# Patient Record
Sex: Female | Born: 1940 | Race: White | Hispanic: No | Marital: Married | Smoking: Never smoker
Health system: Southern US, Community
[De-identification: ages and names within clinical notes are randomized; demographics above are authoritative.]

## PROBLEM LIST (undated history)

## (undated) DIAGNOSIS — K589 Irritable bowel syndrome without diarrhea: Secondary | ICD-10-CM

## (undated) DIAGNOSIS — Z9889 Other specified postprocedural states: Secondary | ICD-10-CM

## (undated) DIAGNOSIS — Z872 Personal history of diseases of the skin and subcutaneous tissue: Secondary | ICD-10-CM

## (undated) DIAGNOSIS — Z860100 Personal history of colon polyps, unspecified: Secondary | ICD-10-CM

## (undated) DIAGNOSIS — K219 Gastro-esophageal reflux disease without esophagitis: Secondary | ICD-10-CM

## (undated) DIAGNOSIS — G629 Polyneuropathy, unspecified: Secondary | ICD-10-CM

## (undated) DIAGNOSIS — Z9221 Personal history of antineoplastic chemotherapy: Secondary | ICD-10-CM

## (undated) DIAGNOSIS — Z9289 Personal history of other medical treatment: Secondary | ICD-10-CM

## (undated) DIAGNOSIS — I890 Lymphedema, not elsewhere classified: Secondary | ICD-10-CM

## (undated) DIAGNOSIS — J45909 Unspecified asthma, uncomplicated: Secondary | ICD-10-CM

## (undated) DIAGNOSIS — E039 Hypothyroidism, unspecified: Secondary | ICD-10-CM

## (undated) DIAGNOSIS — C562 Malignant neoplasm of left ovary: Secondary | ICD-10-CM

## (undated) DIAGNOSIS — D649 Anemia, unspecified: Secondary | ICD-10-CM

## (undated) DIAGNOSIS — L0390 Cellulitis, unspecified: Secondary | ICD-10-CM

## (undated) DIAGNOSIS — K2270 Barrett's esophagus without dysplasia: Secondary | ICD-10-CM

## (undated) DIAGNOSIS — E119 Type 2 diabetes mellitus without complications: Secondary | ICD-10-CM

## (undated) DIAGNOSIS — I10 Essential (primary) hypertension: Secondary | ICD-10-CM

## (undated) DIAGNOSIS — Z8619 Personal history of other infectious and parasitic diseases: Secondary | ICD-10-CM

## (undated) HISTORY — DX: Other specified postprocedural states: Z98.890

## (undated) HISTORY — DX: Personal history of other infectious and parasitic diseases: Z86.19

## (undated) HISTORY — DX: Gastro-esophageal reflux disease without esophagitis: K21.9

## (undated) HISTORY — DX: Cellulitis, unspecified: L03.90

## (undated) HISTORY — DX: Irritable bowel syndrome, unspecified: K58.9

## (undated) HISTORY — DX: Hypothyroidism, unspecified: E03.9

## (undated) HISTORY — DX: Personal history of colon polyps, unspecified: Z86.0100

## (undated) HISTORY — DX: Personal history of other medical treatment: Z92.89

## (undated) HISTORY — DX: Personal history of colonic polyps: Z86.010

## (undated) HISTORY — DX: Unspecified asthma, uncomplicated: J45.909

## (undated) HISTORY — DX: Lymphedema, not elsewhere classified: I89.0

## (undated) HISTORY — DX: Personal history of diseases of the skin and subcutaneous tissue: Z87.2

## (undated) HISTORY — DX: Essential (primary) hypertension: I10

## (undated) HISTORY — DX: Personal history of antineoplastic chemotherapy: Z92.21

## (undated) HISTORY — PX: TUBAL LIGATION: SHX77

## (undated) HISTORY — DX: Barrett's esophagus without dysplasia: K22.70

## (undated) HISTORY — DX: Malignant neoplasm of left ovary: C56.2

---
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
## 1961-04-21 HISTORY — PX: HEMORRHOID SURGERY: SHX153

## 1984-04-21 DIAGNOSIS — Z8619 Personal history of other infectious and parasitic diseases: Secondary | ICD-10-CM

## 1984-04-21 HISTORY — DX: Personal history of other infectious and parasitic diseases: Z86.19

## 1992-04-21 HISTORY — PX: BREAST BIOPSY: SHX20

## 1994-04-21 HISTORY — PX: CHOLECYSTECTOMY: SHX55

## 2006-08-26 ENCOUNTER — Ambulatory Visit: Payer: Self-pay | Admitting: Internal Medicine

## 2006-08-31 ENCOUNTER — Ambulatory Visit: Payer: Self-pay | Admitting: Obstetrics and Gynecology

## 2006-09-09 ENCOUNTER — Ambulatory Visit: Payer: Self-pay | Admitting: Gastroenterology

## 2006-09-15 ENCOUNTER — Ambulatory Visit: Payer: Self-pay | Admitting: Internal Medicine

## 2007-07-21 DIAGNOSIS — Z86018 Personal history of other benign neoplasm: Secondary | ICD-10-CM

## 2007-07-21 HISTORY — DX: Personal history of other benign neoplasm: Z86.018

## 2008-07-05 ENCOUNTER — Ambulatory Visit: Payer: Self-pay | Admitting: Unknown Physician Specialty

## 2008-07-19 ENCOUNTER — Ambulatory Visit: Payer: Self-pay | Admitting: Unknown Physician Specialty

## 2009-11-07 ENCOUNTER — Ambulatory Visit: Payer: Self-pay | Admitting: Specialist

## 2010-02-27 ENCOUNTER — Ambulatory Visit: Payer: Self-pay | Admitting: Unknown Physician Specialty

## 2011-04-29 ENCOUNTER — Ambulatory Visit: Payer: Self-pay | Admitting: Unknown Physician Specialty

## 2012-12-13 ENCOUNTER — Ambulatory Visit: Payer: Self-pay | Admitting: Gynecologic Oncology

## 2012-12-20 ENCOUNTER — Ambulatory Visit: Payer: Self-pay | Admitting: Oncology

## 2012-12-20 HISTORY — PX: OTHER SURGICAL HISTORY: SHX169

## 2012-12-21 ENCOUNTER — Inpatient Hospital Stay: Payer: Self-pay | Admitting: Obstetrics and Gynecology

## 2012-12-21 ENCOUNTER — Other Ambulatory Visit: Payer: Self-pay | Admitting: Obstetrics and Gynecology

## 2012-12-21 LAB — CBC WITH DIFFERENTIAL/PLATELET
Basophil #: 0 10*3/uL (ref 0.0–0.1)
Basophil #: 0 10*3/uL (ref 0.0–0.1)
Basophil %: 0.3 %
Eosinophil %: 1.5 %
Eosinophil %: 0.1 %
HCT: 42.6 % (ref 35.0–47.0)
Lymphocyte %: 22.3 %
MCHC: 35 g/dL (ref 32.0–36.0)
MCHC: 34.7 g/dL (ref 32.0–36.0)
MCV: 86 fL (ref 80–100)
Monocyte #: 0.8 x10 3/mm (ref 0.2–0.9)
Monocyte #: 0.8 x10 3/mm (ref 0.2–0.9)
Monocyte %: 3.9 %
Neutrophil #: 9.6 10*3/uL — ABNORMAL HIGH (ref 1.4–6.5)
Neutrophil #: 19.2 10*3/uL — ABNORMAL HIGH (ref 1.4–6.5)
Platelet: 217 10*3/uL (ref 150–440)
Platelet: 211 10*3/uL (ref 150–440)
RBC: 4.45 10*6/uL (ref 3.80–5.20)
WBC: 13.7 10*3/uL — ABNORMAL HIGH (ref 3.6–11.0)

## 2012-12-21 LAB — COMPREHENSIVE METABOLIC PANEL
Alkaline Phosphatase: 74 U/L (ref 50–136)
Anion Gap: 7 (ref 7–16)
Anion Gap: 3 — ABNORMAL LOW (ref 7–16)
BUN: 14 mg/dL (ref 7–18)
BUN: 11 mg/dL (ref 7–18)
Bilirubin,Total: 0.6 mg/dL (ref 0.2–1.0)
Bilirubin,Total: 0.5 mg/dL (ref 0.2–1.0)
Calcium, Total: 9.4 mg/dL (ref 8.5–10.1)
Calcium, Total: 8 mg/dL — ABNORMAL LOW (ref 8.5–10.1)
Co2: 27 mmol/L (ref 21–32)
Creatinine: 0.91 mg/dL (ref 0.60–1.30)
EGFR (African American): >60
EGFR (Non-African Amer.): >60
EGFR (Non-African Amer.): >60
Osmolality: 278 (ref 275–301)
Osmolality: 276 (ref 275–301)
Potassium: 3 mmol/L — ABNORMAL LOW (ref 3.5–5.1)
Potassium: 3.3 mmol/L — ABNORMAL LOW (ref 3.5–5.1)
SGOT(AST): 22 U/L (ref 15–37)
SGOT(AST): 30 U/L (ref 15–37)
SGPT (ALT): 33 U/L (ref 12–78)
SGPT (ALT): 34 U/L (ref 12–78)
Sodium: 139 mmol/L (ref 136–145)
Sodium: 136 mmol/L (ref 136–145)
Total Protein: 7.1 g/dL (ref 6.4–8.2)
Total Protein: 6.1 g/dL — ABNORMAL LOW (ref 6.4–8.2)

## 2012-12-22 LAB — HEMATOCRIT: HCT: 43.5 % (ref 35.0–47.0)

## 2013-01-10 LAB — PATHOLOGY REPORT

## 2013-01-11 ENCOUNTER — Ambulatory Visit: Payer: Self-pay | Admitting: Oncology

## 2013-01-11 LAB — COMPREHENSIVE METABOLIC PANEL
Albumin: 3.7 g/dL (ref 3.4–5.0)
Anion Gap: 9 (ref 7–16)
BUN: 18 mg/dL (ref 7–18)
Bilirubin,Total: 0.4 mg/dL (ref 0.2–1.0)
Calcium, Total: 9.9 mg/dL (ref 8.5–10.1)
Co2: 31 mmol/L (ref 21–32)
Creatinine: 0.95 mg/dL (ref 0.60–1.30)
EGFR (African American): >60
Osmolality: 284 (ref 275–301)
Potassium: 3 mmol/L — ABNORMAL LOW (ref 3.5–5.1)
SGOT(AST): 18 U/L (ref 15–37)
SGPT (ALT): 30 U/L (ref 12–78)
Total Protein: 7.5 g/dL (ref 6.4–8.2)

## 2013-01-11 LAB — CBC CANCER CENTER
Basophil %: 0.7 %
HCT: 42.5 % (ref 35.0–47.0)
Lymphocyte %: 22.9 %
MCHC: 34.2 g/dL (ref 32.0–36.0)
Monocyte #: 0.5 x10 3/mm (ref 0.2–0.9)
Monocyte %: 5.6 %
Platelet: 302 x10 3/mm (ref 150–440)
RBC: 4.98 10*6/uL (ref 3.80–5.20)
WBC: 8.9 x10 3/mm (ref 3.6–11.0)

## 2013-01-13 LAB — CANCER ANTIGEN 19-9: CA 19-9: 62 U/mL — ABNORMAL HIGH (ref 0–35)

## 2013-01-19 ENCOUNTER — Ambulatory Visit: Payer: Self-pay | Admitting: Oncology

## 2013-01-27 ENCOUNTER — Ambulatory Visit: Payer: Self-pay | Admitting: Surgery

## 2013-02-07 LAB — CBC CANCER CENTER
Basophil #: 0 x10 3/mm (ref 0.0–0.1)
Eosinophil #: 0.2 x10 3/mm (ref 0.0–0.7)
Eosinophil %: 6.1 %
HCT: 37.2 % (ref 35.0–47.0)
HGB: 12.9 g/dL (ref 12.0–16.0)
Lymphocyte %: 22.8 %
MCH: 29.5 pg (ref 26.0–34.0)
MCHC: 34.7 g/dL (ref 32.0–36.0)
Monocyte #: 0.2 x10 3/mm (ref 0.2–0.9)
Monocyte %: 4.3 %
Neutrophil #: 2.6 x10 3/mm (ref 1.4–6.5)
RBC: 4.39 10*6/uL (ref 3.80–5.20)
RDW: 13.9 % (ref 11.5–14.5)
WBC: 3.9 x10 3/mm (ref 3.6–11.0)

## 2013-02-14 LAB — CBC CANCER CENTER
Basophil #: 0 x10 3/mm (ref 0.0–0.1)
Basophil %: 0.3 %
Eosinophil #: 0.2 x10 3/mm (ref 0.0–0.7)
Lymphocyte %: 46.1 %
MCH: 29.3 pg (ref 26.0–34.0)
MCV: 86 fL (ref 80–100)
Monocyte #: 0.7 x10 3/mm (ref 0.2–0.9)
Platelet: 224 x10 3/mm (ref 150–440)
RDW: 14.8 % — ABNORMAL HIGH (ref 11.5–14.5)

## 2013-02-19 ENCOUNTER — Ambulatory Visit: Payer: Self-pay | Admitting: Oncology

## 2013-02-21 LAB — COMPREHENSIVE METABOLIC PANEL
Albumin: 3.6 g/dL (ref 3.4–5.0)
Anion Gap: 12 (ref 7–16)
Bilirubin,Total: 0.5 mg/dL (ref 0.2–1.0)
Co2: 27 mmol/L (ref 21–32)
EGFR (African American): >60
Glucose: 141 mg/dL — ABNORMAL HIGH (ref 65–99)
Potassium: 2.9 mmol/L — ABNORMAL LOW (ref 3.5–5.1)
SGOT(AST): 20 U/L (ref 15–37)
SGPT (ALT): 32 U/L (ref 12–78)
Total Protein: 7.3 g/dL (ref 6.4–8.2)

## 2013-02-21 LAB — CBC CANCER CENTER
Basophil %: 0.7 %
HCT: 38.4 % (ref 35.0–47.0)
Lymphocyte %: 22.5 %
MCHC: 34.4 g/dL (ref 32.0–36.0)
MCV: 86 fL (ref 80–100)
Monocyte #: 0.5 x10 3/mm (ref 0.2–0.9)
Monocyte %: 5.8 %
Neutrophil %: 70 %
RBC: 4.49 10*6/uL (ref 3.80–5.20)
WBC: 9.3 x10 3/mm (ref 3.6–11.0)

## 2013-02-22 LAB — CA 125: CA 125: 31.1 U/mL (ref 0.0–34.0)

## 2013-02-28 LAB — CBC CANCER CENTER
Basophil #: 0.1 x10 3/mm (ref 0.0–0.1)
Eosinophil #: 0.2 x10 3/mm (ref 0.0–0.7)
HCT: 39.1 % (ref 35.0–47.0)
Lymphocyte %: 23.5 %
MCHC: 33.9 g/dL (ref 32.0–36.0)
Monocyte %: 6.4 %
Neutrophil %: 67.1 %
RBC: 4.54 10*6/uL (ref 3.80–5.20)

## 2013-03-07 LAB — CBC CANCER CENTER
Basophil #: 0 x10 3/mm (ref 0.0–0.1)
Basophil %: 0.4 %
Eosinophil %: 1.1 %
HCT: 34.5 % — ABNORMAL LOW (ref 35.0–47.0)
HGB: 11.9 g/dL — ABNORMAL LOW (ref 12.0–16.0)
MCH: 29.3 pg (ref 26.0–34.0)
MCHC: 34.4 g/dL (ref 32.0–36.0)
MCV: 85 fL (ref 80–100)
Monocyte %: 5.6 %
Neutrophil #: 2.7 x10 3/mm (ref 1.4–6.5)
Neutrophil %: 54.7 %
Platelet: 92 x10 3/mm — ABNORMAL LOW (ref 150–440)
RBC: 4.05 10*6/uL (ref 3.80–5.20)
RDW: 14.7 % — ABNORMAL HIGH (ref 11.5–14.5)
WBC: 5 x10 3/mm (ref 3.6–11.0)

## 2013-03-14 LAB — CBC CANCER CENTER
Basophil #: 0 x10 3/mm (ref 0.0–0.1)
Eosinophil #: 0 x10 3/mm (ref 0.0–0.7)
HCT: 35.1 % (ref 35.0–47.0)
Lymphocyte #: 0.7 x10 3/mm — ABNORMAL LOW (ref 1.0–3.6)
Lymphocyte %: 11.7 %
MCH: 29.9 pg (ref 26.0–34.0)
MCHC: 33.9 g/dL (ref 32.0–36.0)
MCV: 88 fL (ref 80–100)
Monocyte #: 0.1 x10 3/mm — ABNORMAL LOW (ref 0.2–0.9)
Monocyte %: 1.1 %
Neutrophil %: 86.9 %
Platelet: 201 x10 3/mm (ref 150–440)
RDW: 16.4 % — ABNORMAL HIGH (ref 11.5–14.5)

## 2013-03-15 LAB — CA 125: CA 125: 27.4 U/mL (ref 0.0–34.0)

## 2013-03-21 ENCOUNTER — Ambulatory Visit: Payer: Self-pay | Admitting: Oncology

## 2013-03-21 LAB — CBC CANCER CENTER
Basophil %: 0.1 %
Eosinophil #: 0 x10 3/mm (ref 0.0–0.7)
Eosinophil %: 0.1 %
HCT: 34.6 % — ABNORMAL LOW (ref 35.0–47.0)
HGB: 12 g/dL (ref 12.0–16.0)
Lymphocyte #: 1.1 x10 3/mm (ref 1.0–3.6)
Lymphocyte %: 22.2 %
MCH: 30.4 pg (ref 26.0–34.0)
MCHC: 34.7 g/dL (ref 32.0–36.0)
Monocyte #: 0.1 x10 3/mm — ABNORMAL LOW (ref 0.2–0.9)
Monocyte %: 1.1 %
RBC: 3.95 10*6/uL (ref 3.80–5.20)
RDW: 16.9 % — ABNORMAL HIGH (ref 11.5–14.5)

## 2013-03-21 LAB — COMPREHENSIVE METABOLIC PANEL
Albumin: 3.6 g/dL (ref 3.4–5.0)
Anion Gap: 8 (ref 7–16)
BUN: 18 mg/dL (ref 7–18)
Bilirubin,Total: 0.5 mg/dL (ref 0.2–1.0)
Co2: 32 mmol/L (ref 21–32)
EGFR (Non-African Amer.): 53 — ABNORMAL LOW
Glucose: 169 mg/dL — ABNORMAL HIGH (ref 65–99)
Osmolality: 285 (ref 275–301)
Potassium: 2.9 mmol/L — ABNORMAL LOW (ref 3.5–5.1)
SGOT(AST): 32 U/L (ref 15–37)
SGPT (ALT): 66 U/L (ref 12–78)
Total Protein: 7.3 g/dL (ref 6.4–8.2)

## 2013-03-21 LAB — URINALYSIS, COMPLETE
Ph: 6 (ref 4.5–8.0)
Protein: NEGATIVE
RBC,UR: 1 /HPF (ref 0–5)
Specific Gravity: 1.012 (ref 1.003–1.030)
Squamous Epithelial: 1

## 2013-04-04 LAB — CBC CANCER CENTER
HCT: 29.5 % — ABNORMAL LOW (ref 35.0–47.0)
HGB: 9.9 g/dL — ABNORMAL LOW (ref 12.0–16.0)
Lymphocyte %: 51.7 %
MCH: 30.4 pg (ref 26.0–34.0)
MCV: 90 fL (ref 80–100)
Monocyte #: 0.1 x10 3/mm — ABNORMAL LOW (ref 0.2–0.9)
Monocyte %: 3.1 %
Platelet: 564 x10 3/mm — ABNORMAL HIGH (ref 150–440)
RBC: 3.27 10*6/uL — ABNORMAL LOW (ref 3.80–5.20)
RDW: 17.9 % — ABNORMAL HIGH (ref 11.5–14.5)

## 2013-04-04 LAB — COMPREHENSIVE METABOLIC PANEL
Albumin: 3.6 g/dL (ref 3.4–5.0)
Chloride: 99 mmol/L (ref 98–107)
Co2: 25 mmol/L (ref 21–32)
Creatinine: 1.16 mg/dL (ref 0.60–1.30)
EGFR (African American): 55 — ABNORMAL LOW
Glucose: 284 mg/dL — ABNORMAL HIGH (ref 65–99)
Osmolality: 288 (ref 275–301)
Potassium: 2.9 mmol/L — ABNORMAL LOW (ref 3.5–5.1)
SGPT (ALT): 98 U/L — ABNORMAL HIGH (ref 12–78)
Sodium: 139 mmol/L (ref 136–145)
Total Protein: 7.2 g/dL (ref 6.4–8.2)

## 2013-04-11 LAB — CBC CANCER CENTER
Basophil #: 0.1 x10 3/mm (ref 0.0–0.1)
HGB: 11.5 g/dL — ABNORMAL LOW (ref 12.0–16.0)
Lymphocyte #: 1.7 x10 3/mm (ref 1.0–3.6)
MCHC: 33.5 g/dL (ref 32.0–36.0)
MCV: 91 fL (ref 80–100)
Monocyte %: 9.7 %
Neutrophil #: 4.3 x10 3/mm (ref 1.4–6.5)
Platelet: 400 x10 3/mm (ref 150–440)
RBC: 3.78 10*6/uL — ABNORMAL LOW (ref 3.80–5.20)
RDW: 20.8 % — ABNORMAL HIGH (ref 11.5–14.5)

## 2013-04-12 LAB — CA 125: CA 125: 26.2 U/mL (ref 0.0–34.0)

## 2013-04-18 LAB — CBC CANCER CENTER
Basophil #: 0 x10 3/mm (ref 0.0–0.1)
Basophil %: 0.2 %
Eosinophil #: 0 x10 3/mm (ref 0.0–0.7)
HCT: 32.1 % — ABNORMAL LOW (ref 35.0–47.0)
Lymphocyte %: 16.1 %
MCHC: 33.9 g/dL (ref 32.0–36.0)
MCV: 90 fL (ref 80–100)
Neutrophil #: 5.8 x10 3/mm (ref 1.4–6.5)
Neutrophil %: 80.9 %
RBC: 3.56 10*6/uL — ABNORMAL LOW (ref 3.80–5.20)
WBC: 7.1 x10 3/mm (ref 3.6–11.0)

## 2013-04-21 ENCOUNTER — Ambulatory Visit: Payer: Self-pay | Admitting: Oncology

## 2013-05-02 LAB — COMPREHENSIVE METABOLIC PANEL
Albumin: 3.9 g/dL (ref 3.4–5.0)
Alkaline Phosphatase: 91 U/L
Anion Gap: 14 (ref 7–16)
BUN: 11 mg/dL (ref 7–18)
Bilirubin,Total: 0.4 mg/dL (ref 0.2–1.0)
CALCIUM: 8.9 mg/dL (ref 8.5–10.1)
CHLORIDE: 99 mmol/L (ref 98–107)
Co2: 27 mmol/L (ref 21–32)
Creatinine: 1.03 mg/dL (ref 0.60–1.30)
EGFR (African American): >60
EGFR (Non-African Amer.): 55 — ABNORMAL LOW
Glucose: 198 mg/dL — ABNORMAL HIGH (ref 65–99)
OSMOLALITY: 284 (ref 275–301)
Potassium: 3.6 mmol/L (ref 3.5–5.1)
SGOT(AST): 44 U/L — ABNORMAL HIGH (ref 15–37)
SGPT (ALT): 70 U/L (ref 12–78)
Sodium: 140 mmol/L (ref 136–145)
TOTAL PROTEIN: 7.4 g/dL (ref 6.4–8.2)

## 2013-05-02 LAB — CBC CANCER CENTER
BASOS PCT: 1.4 %
Basophil #: 0.1 x10 3/mm (ref 0.0–0.1)
EOS PCT: 0.5 %
Eosinophil #: 0 x10 3/mm (ref 0.0–0.7)
HCT: 29.9 % — ABNORMAL LOW (ref 35.0–47.0)
HGB: 9.9 g/dL — AB (ref 12.0–16.0)
LYMPHS ABS: 1.3 x10 3/mm (ref 1.0–3.6)
Lymphocyte %: 18.2 %
MCH: 31.3 pg (ref 26.0–34.0)
MCHC: 33.1 g/dL (ref 32.0–36.0)
MCV: 94 fL (ref 80–100)
MONO ABS: 0.2 x10 3/mm (ref 0.2–0.9)
Monocyte %: 2.6 %
Neutrophil #: 5.7 x10 3/mm (ref 1.4–6.5)
Neutrophil %: 77.3 %
PLATELETS: 281 x10 3/mm (ref 150–440)
RBC: 3.17 10*6/uL — ABNORMAL LOW (ref 3.80–5.20)
RDW: 22.7 % — ABNORMAL HIGH (ref 11.5–14.5)
WBC: 7.4 x10 3/mm (ref 3.6–11.0)

## 2013-05-03 LAB — CA 125: CA 125: 21.8 U/mL (ref 0.0–34.0)

## 2013-05-09 LAB — CBC CANCER CENTER
Basophil #: 0 x10 3/mm (ref 0.0–0.1)
Basophil %: 0.3 %
EOS PCT: 0.3 %
Eosinophil #: 0 x10 3/mm (ref 0.0–0.7)
HCT: 29.1 % — AB (ref 35.0–47.0)
HGB: 9.7 g/dL — ABNORMAL LOW (ref 12.0–16.0)
LYMPHS ABS: 1.4 x10 3/mm (ref 1.0–3.6)
Lymphocyte %: 27.4 %
MCH: 30.9 pg (ref 26.0–34.0)
MCHC: 33.5 g/dL (ref 32.0–36.0)
MCV: 92 fL (ref 80–100)
MONOS PCT: 4 %
Monocyte #: 0.2 x10 3/mm (ref 0.2–0.9)
NEUTROS ABS: 3.5 x10 3/mm (ref 1.4–6.5)
Neutrophil %: 68 %
Platelet: 215 x10 3/mm (ref 150–440)
RBC: 3.15 10*6/uL — ABNORMAL LOW (ref 3.80–5.20)
RDW: 20.6 % — ABNORMAL HIGH (ref 11.5–14.5)
WBC: 5.1 x10 3/mm (ref 3.6–11.0)

## 2013-05-16 LAB — CBC CANCER CENTER
BASOS PCT: 0.2 %
Basophil #: 0 x10 3/mm (ref 0.0–0.1)
EOS PCT: 0.3 %
Eosinophil #: 0 x10 3/mm (ref 0.0–0.7)
HCT: 25.5 % — ABNORMAL LOW (ref 35.0–47.0)
HGB: 8.5 g/dL — AB (ref 12.0–16.0)
LYMPHS ABS: 0.9 x10 3/mm — AB (ref 1.0–3.6)
LYMPHS PCT: 21.8 %
MCH: 31 pg (ref 26.0–34.0)
MCHC: 33.3 g/dL (ref 32.0–36.0)
MCV: 93 fL (ref 80–100)
MONO ABS: 0.3 x10 3/mm (ref 0.2–0.9)
MONOS PCT: 6.5 %
NEUTROS ABS: 3.1 x10 3/mm (ref 1.4–6.5)
Neutrophil %: 71.2 %
Platelet: 24 x10 3/mm — CL (ref 150–440)
RBC: 2.74 10*6/uL — ABNORMAL LOW (ref 3.80–5.20)
RDW: 19.1 % — ABNORMAL HIGH (ref 11.5–14.5)
WBC: 4.4 x10 3/mm (ref 3.6–11.0)

## 2013-05-22 ENCOUNTER — Ambulatory Visit: Payer: Self-pay | Admitting: Oncology

## 2013-05-23 LAB — CBC CANCER CENTER
Basophil #: 0 x10 3/mm (ref 0.0–0.1)
Basophil %: 0.5 %
EOS ABS: 0 x10 3/mm (ref 0.0–0.7)
Eosinophil %: 0.2 %
HCT: 28.4 % — ABNORMAL LOW (ref 35.0–47.0)
HGB: 9.3 g/dL — ABNORMAL LOW (ref 12.0–16.0)
LYMPHS PCT: 16.3 %
Lymphocyte #: 0.9 x10 3/mm — ABNORMAL LOW (ref 1.0–3.6)
MCH: 31.9 pg (ref 26.0–34.0)
MCHC: 32.7 g/dL (ref 32.0–36.0)
MCV: 97 fL (ref 80–100)
MONO ABS: 0.1 x10 3/mm — AB (ref 0.2–0.9)
MONOS PCT: 1.9 %
Neutrophil #: 4.6 x10 3/mm (ref 1.4–6.5)
Neutrophil %: 81.1 %
PLATELETS: 136 x10 3/mm — AB (ref 150–440)
RBC: 2.91 10*6/uL — ABNORMAL LOW (ref 3.80–5.20)
RDW: 23.6 % — ABNORMAL HIGH (ref 11.5–14.5)
WBC: 5.7 x10 3/mm (ref 3.6–11.0)

## 2013-05-23 LAB — COMPREHENSIVE METABOLIC PANEL
ALBUMIN: 3.9 g/dL (ref 3.4–5.0)
ALT: 47 U/L (ref 12–78)
Alkaline Phosphatase: 73 U/L
Anion Gap: 17 — ABNORMAL HIGH (ref 7–16)
BILIRUBIN TOTAL: 0.4 mg/dL (ref 0.2–1.0)
BUN: 16 mg/dL (ref 7–18)
CALCIUM: 8.8 mg/dL (ref 8.5–10.1)
Chloride: 96 mmol/L — ABNORMAL LOW (ref 98–107)
Co2: 25 mmol/L (ref 21–32)
Creatinine: 1.06 mg/dL (ref 0.60–1.30)
EGFR (African American): >60
EGFR (Non-African Amer.): 53 — ABNORMAL LOW
Glucose: 256 mg/dL — ABNORMAL HIGH (ref 65–99)
Osmolality: 286 (ref 275–301)
Potassium: 3.2 mmol/L — ABNORMAL LOW (ref 3.5–5.1)
SGOT(AST): 29 U/L (ref 15–37)
Sodium: 138 mmol/L (ref 136–145)
Total Protein: 7.3 g/dL (ref 6.4–8.2)

## 2013-05-23 LAB — IRON AND TIBC
IRON BIND. CAP.(TOTAL): 392 ug/dL (ref 250–450)
IRON SATURATION: 17 %
Iron: 67 ug/dL (ref 50–170)
UNBOUND IRON-BIND. CAP.: 325 ug/dL

## 2013-05-24 LAB — CA 125: CA 125: 18.3 U/mL (ref 0.0–34.0)

## 2013-05-30 LAB — CBC CANCER CENTER
Basophil #: 0 x10 3/mm (ref 0.0–0.1)
Basophil %: 0.3 %
EOS PCT: 0.2 %
Eosinophil #: 0 x10 3/mm (ref 0.0–0.7)
HCT: 26.2 % — AB (ref 35.0–47.0)
HGB: 8.7 g/dL — ABNORMAL LOW (ref 12.0–16.0)
LYMPHS PCT: 29.3 %
Lymphocyte #: 0.7 x10 3/mm — ABNORMAL LOW (ref 1.0–3.6)
MCH: 31.3 pg (ref 26.0–34.0)
MCHC: 33.2 g/dL (ref 32.0–36.0)
MCV: 94 fL (ref 80–100)
MONO ABS: 0.1 x10 3/mm — AB (ref 0.2–0.9)
Monocyte %: 2.6 %
Neutrophil #: 1.5 x10 3/mm (ref 1.4–6.5)
Neutrophil %: 67.6 %
Platelet: 112 x10 3/mm — ABNORMAL LOW (ref 150–440)
RBC: 2.78 10*6/uL — ABNORMAL LOW (ref 3.80–5.20)
RDW: 20.4 % — ABNORMAL HIGH (ref 11.5–14.5)
WBC: 2.2 x10 3/mm — ABNORMAL LOW (ref 3.6–11.0)

## 2013-06-13 LAB — COMPREHENSIVE METABOLIC PANEL
ALK PHOS: 73 U/L
AST: 43 U/L — AB (ref 15–37)
Albumin: 3.8 g/dL (ref 3.4–5.0)
Anion Gap: 13 (ref 7–16)
BUN: 9 mg/dL (ref 7–18)
Bilirubin,Total: 0.4 mg/dL (ref 0.2–1.0)
CALCIUM: 8.4 mg/dL — AB (ref 8.5–10.1)
CHLORIDE: 100 mmol/L (ref 98–107)
Co2: 28 mmol/L (ref 21–32)
Creatinine: 1.05 mg/dL (ref 0.60–1.30)
EGFR (African American): >60
GFR CALC NON AF AMER: 53 — AB
Glucose: 127 mg/dL — ABNORMAL HIGH (ref 65–99)
Osmolality: 282 (ref 275–301)
Potassium: 3 mmol/L — ABNORMAL LOW (ref 3.5–5.1)
SGPT (ALT): 50 U/L (ref 12–78)
Sodium: 141 mmol/L (ref 136–145)
Total Protein: 7.1 g/dL (ref 6.4–8.2)

## 2013-06-13 LAB — CBC CANCER CENTER
Basophil #: 0.1 x10 3/mm (ref 0.0–0.1)
Basophil %: 3.4 %
EOS ABS: 0.1 x10 3/mm (ref 0.0–0.7)
EOS PCT: 1.8 %
HCT: 24.5 % — ABNORMAL LOW (ref 35.0–47.0)
HGB: 7.9 g/dL — ABNORMAL LOW (ref 12.0–16.0)
LYMPHS PCT: 32.7 %
Lymphocyte #: 1.4 x10 3/mm (ref 1.0–3.6)
MCH: 31.8 pg (ref 26.0–34.0)
MCHC: 32.1 g/dL (ref 32.0–36.0)
MCV: 99 fL (ref 80–100)
Monocyte #: 0.7 x10 3/mm (ref 0.2–0.9)
Monocyte %: 16.4 %
NEUTROS ABS: 2 x10 3/mm (ref 1.4–6.5)
Neutrophil %: 45.7 %
Platelet: 170 x10 3/mm (ref 150–440)
RBC: 2.48 10*6/uL — ABNORMAL LOW (ref 3.80–5.20)
RDW: 23.4 % — AB (ref 11.5–14.5)
WBC: 4.4 x10 3/mm (ref 3.6–11.0)

## 2013-06-14 LAB — CA 125: CA 125: 19.1 U/mL (ref 0.0–34.0)

## 2013-06-19 ENCOUNTER — Ambulatory Visit: Payer: Self-pay | Admitting: Oncology

## 2013-07-20 ENCOUNTER — Ambulatory Visit: Payer: Self-pay | Admitting: Oncology

## 2013-08-16 LAB — CBC CANCER CENTER
Basophil #: 0 x10 3/mm (ref 0.0–0.1)
Basophil %: 0.7 %
Eosinophil #: 0.2 x10 3/mm (ref 0.0–0.7)
Eosinophil %: 3.1 %
HCT: 39.3 % (ref 35.0–47.0)
HGB: 13.1 g/dL (ref 12.0–16.0)
LYMPHS ABS: 1.4 x10 3/mm (ref 1.0–3.6)
Lymphocyte %: 20.3 %
MCH: 29.1 pg (ref 26.0–34.0)
MCHC: 33.3 g/dL (ref 32.0–36.0)
MCV: 87 fL (ref 80–100)
Monocyte #: 0.4 x10 3/mm (ref 0.2–0.9)
Monocyte %: 5.1 %
Neutrophil #: 4.9 x10 3/mm (ref 1.4–6.5)
Neutrophil %: 70.8 %
PLATELETS: 164 x10 3/mm (ref 150–440)
RBC: 4.5 10*6/uL (ref 3.80–5.20)
RDW: 16.4 % — ABNORMAL HIGH (ref 11.5–14.5)
WBC: 7 x10 3/mm (ref 3.6–11.0)

## 2013-08-16 LAB — COMPREHENSIVE METABOLIC PANEL
ALK PHOS: 78 U/L
ANION GAP: 11 (ref 7–16)
AST: 42 U/L — AB (ref 15–37)
Albumin: 3.6 g/dL (ref 3.4–5.0)
BUN: 13 mg/dL (ref 7–18)
Bilirubin,Total: 0.5 mg/dL (ref 0.2–1.0)
CALCIUM: 9.9 mg/dL (ref 8.5–10.1)
CO2: 30 mmol/L (ref 21–32)
Chloride: 100 mmol/L (ref 98–107)
Creatinine: 1.11 mg/dL (ref 0.60–1.30)
GFR CALC AF AMER: 57 — AB
GFR CALC NON AF AMER: 50 — AB
Glucose: 210 mg/dL — ABNORMAL HIGH (ref 65–99)
Osmolality: 288 (ref 275–301)
Potassium: 2.8 mmol/L — ABNORMAL LOW (ref 3.5–5.1)
SGPT (ALT): 56 U/L (ref 12–78)
Sodium: 141 mmol/L (ref 136–145)
TOTAL PROTEIN: 7.5 g/dL (ref 6.4–8.2)

## 2013-08-17 LAB — CA 125: CA 125: 24.4 U/mL (ref 0.0–34.0)

## 2013-08-19 ENCOUNTER — Ambulatory Visit: Payer: Self-pay | Admitting: Oncology

## 2013-09-27 ENCOUNTER — Ambulatory Visit: Payer: Self-pay | Admitting: Oncology

## 2013-10-05 LAB — CBC CANCER CENTER
Basophil #: 0.1 x10 3/mm (ref 0.0–0.1)
Basophil %: 0.8 %
EOS ABS: 0.2 x10 3/mm (ref 0.0–0.7)
Eosinophil %: 2.6 %
HCT: 39.1 % (ref 35.0–47.0)
HGB: 13.1 g/dL (ref 12.0–16.0)
Lymphocyte #: 1.2 x10 3/mm (ref 1.0–3.6)
Lymphocyte %: 18.4 %
MCH: 28.8 pg (ref 26.0–34.0)
MCHC: 33.4 g/dL (ref 32.0–36.0)
MCV: 86 fL (ref 80–100)
MONOS PCT: 6.1 %
Monocyte #: 0.4 x10 3/mm (ref 0.2–0.9)
NEUTROS PCT: 72.1 %
Neutrophil #: 4.7 x10 3/mm (ref 1.4–6.5)
Platelet: 156 x10 3/mm (ref 150–440)
RBC: 4.53 10*6/uL (ref 3.80–5.20)
RDW: 16.4 % — ABNORMAL HIGH (ref 11.5–14.5)
WBC: 6.5 x10 3/mm (ref 3.6–11.0)

## 2013-10-05 LAB — COMPREHENSIVE METABOLIC PANEL
ALBUMIN: 3.7 g/dL (ref 3.4–5.0)
ALK PHOS: 87 U/L
ANION GAP: 11 (ref 7–16)
BILIRUBIN TOTAL: 0.5 mg/dL (ref 0.2–1.0)
BUN: 11 mg/dL (ref 7–18)
CALCIUM: 10.1 mg/dL (ref 8.5–10.1)
Chloride: 99 mmol/L (ref 98–107)
Co2: 30 mmol/L (ref 21–32)
Creatinine: 0.91 mg/dL (ref 0.60–1.30)
Glucose: 135 mg/dL — ABNORMAL HIGH (ref 65–99)
OSMOLALITY: 281 (ref 275–301)
POTASSIUM: 3 mmol/L — AB (ref 3.5–5.1)
SGOT(AST): 38 U/L — ABNORMAL HIGH (ref 15–37)
SGPT (ALT): 53 U/L (ref 12–78)
SODIUM: 140 mmol/L (ref 136–145)
Total Protein: 7.6 g/dL (ref 6.4–8.2)

## 2013-10-05 LAB — URINALYSIS, COMPLETE
BACTERIA: NONE SEEN
Bilirubin,UR: NEGATIVE
Blood: NEGATIVE
Glucose,UR: NEGATIVE mg/dL (ref 0–75)
Ketone: NEGATIVE
LEUKOCYTE ESTERASE: NEGATIVE
Nitrite: NEGATIVE
Ph: 6 (ref 4.5–8.0)
Protein: NEGATIVE
RBC,UR: NONE SEEN /HPF (ref 0–5)
Specific Gravity: 1.01 (ref 1.003–1.030)
Squamous Epithelial: 2
WBC UR: 1 /HPF (ref 0–5)

## 2013-10-06 LAB — URINE CULTURE

## 2013-10-06 LAB — CA 125: CA 125: 28.1 U/mL (ref 0.0–34.0)

## 2013-10-19 ENCOUNTER — Ambulatory Visit: Payer: Self-pay | Admitting: Oncology

## 2013-11-07 ENCOUNTER — Ambulatory Visit: Payer: Self-pay | Admitting: Gastroenterology

## 2013-11-07 DIAGNOSIS — Z9889 Other specified postprocedural states: Secondary | ICD-10-CM

## 2013-11-07 DIAGNOSIS — Z860100 Personal history of colon polyps, unspecified: Secondary | ICD-10-CM

## 2013-11-07 HISTORY — DX: Personal history of colonic polyps: Z86.010

## 2013-11-07 HISTORY — PX: ESOPHAGOGASTRODUODENOSCOPY ENDOSCOPY: SHX5814

## 2013-11-07 HISTORY — DX: Personal history of colon polyps, unspecified: Z86.0100

## 2013-11-07 HISTORY — DX: Other specified postprocedural states: Z98.890

## 2013-11-07 HISTORY — PX: COLONOSCOPY W/ POLYPECTOMY: SHX1380

## 2013-11-08 ENCOUNTER — Ambulatory Visit: Payer: Self-pay | Admitting: Oncology

## 2013-11-08 LAB — CBC CANCER CENTER
Basophil #: 0 x10 3/mm (ref 0.0–0.1)
Basophil %: 0.4 %
EOS ABS: 0.2 x10 3/mm (ref 0.0–0.7)
Eosinophil %: 1.8 %
HCT: 38 % (ref 35.0–47.0)
HGB: 12.7 g/dL (ref 12.0–16.0)
LYMPHS ABS: 1.7 x10 3/mm (ref 1.0–3.6)
Lymphocyte %: 18.1 %
MCH: 29.2 pg (ref 26.0–34.0)
MCHC: 33.4 g/dL (ref 32.0–36.0)
MCV: 88 fL (ref 80–100)
MONO ABS: 0.5 x10 3/mm (ref 0.2–0.9)
Monocyte %: 5.2 %
Neutrophil #: 6.9 x10 3/mm — ABNORMAL HIGH (ref 1.4–6.5)
Neutrophil %: 74.5 %
PLATELETS: 158 x10 3/mm (ref 150–440)
RBC: 4.34 10*6/uL (ref 3.80–5.20)
RDW: 16.4 % — AB (ref 11.5–14.5)
WBC: 9.2 x10 3/mm (ref 3.6–11.0)

## 2013-11-08 LAB — COMPREHENSIVE METABOLIC PANEL
Albumin: 3.3 g/dL — ABNORMAL LOW (ref 3.4–5.0)
Alkaline Phosphatase: 84 U/L
Anion Gap: 12 (ref 7–16)
BILIRUBIN TOTAL: 0.6 mg/dL (ref 0.2–1.0)
BUN: 11 mg/dL (ref 7–18)
CHLORIDE: 99 mmol/L (ref 98–107)
CREATININE: 0.95 mg/dL (ref 0.60–1.30)
Calcium, Total: 9.7 mg/dL (ref 8.5–10.1)
Co2: 30 mmol/L (ref 21–32)
EGFR (African American): >60
EGFR (Non-African Amer.): 60 — ABNORMAL LOW
GLUCOSE: 133 mg/dL — AB (ref 65–99)
Osmolality: 283 (ref 275–301)
Potassium: 2.7 mmol/L — ABNORMAL LOW (ref 3.5–5.1)
SGOT(AST): 39 U/L — ABNORMAL HIGH (ref 15–37)
SGPT (ALT): 53 U/L (ref 12–78)
Sodium: 141 mmol/L (ref 136–145)
TOTAL PROTEIN: 7.2 g/dL (ref 6.4–8.2)

## 2013-11-08 LAB — URINALYSIS, COMPLETE
BACTERIA: NONE SEEN
BILIRUBIN, UR: NEGATIVE
Blood: NEGATIVE
Glucose,UR: NEGATIVE mg/dL (ref 0–75)
KETONE: NEGATIVE
Nitrite: NEGATIVE
Ph: 6 (ref 4.5–8.0)
Protein: NEGATIVE
RBC,UR: 2 /HPF (ref 0–5)
SPECIFIC GRAVITY: 1.015 (ref 1.003–1.030)
Squamous Epithelial: 2

## 2013-11-09 LAB — PATHOLOGY REPORT

## 2013-11-09 LAB — URINE CULTURE

## 2013-11-19 ENCOUNTER — Ambulatory Visit: Payer: Self-pay | Admitting: Oncology

## 2013-12-20 ENCOUNTER — Ambulatory Visit: Payer: Self-pay | Admitting: Oncology

## 2014-01-19 ENCOUNTER — Ambulatory Visit: Payer: Self-pay | Admitting: Oncology

## 2014-02-16 DIAGNOSIS — Z8543 Personal history of malignant neoplasm of ovary: Secondary | ICD-10-CM | POA: Insufficient documentation

## 2014-02-16 DIAGNOSIS — E039 Hypothyroidism, unspecified: Secondary | ICD-10-CM | POA: Insufficient documentation

## 2014-02-19 ENCOUNTER — Ambulatory Visit: Payer: Self-pay | Admitting: Oncology

## 2014-02-22 LAB — COMPREHENSIVE METABOLIC PANEL
Albumin: 3.5 g/dL (ref 3.4–5.0)
Alkaline Phosphatase: 97 U/L
Anion Gap: 11 (ref 7–16)
BUN: 14 mg/dL (ref 7–18)
Bilirubin,Total: 0.5 mg/dL (ref 0.2–1.0)
CHLORIDE: 98 mmol/L (ref 98–107)
Calcium, Total: 9.5 mg/dL (ref 8.5–10.1)
Co2: 29 mmol/L (ref 21–32)
Creatinine: 1.03 mg/dL (ref 0.60–1.30)
GFR CALC NON AF AMER: 56 — AB
Glucose: 144 mg/dL — ABNORMAL HIGH (ref 65–99)
Osmolality: 279 (ref 275–301)
Potassium: 2.8 mmol/L — ABNORMAL LOW (ref 3.5–5.1)
SGOT(AST): 30 U/L (ref 15–37)
SGPT (ALT): 53 U/L
Sodium: 138 mmol/L (ref 136–145)
Total Protein: 7.2 g/dL (ref 6.4–8.2)

## 2014-02-22 LAB — CBC CANCER CENTER
BASOS ABS: 0 x10 3/mm (ref 0.0–0.1)
Basophil %: 0.3 %
EOS ABS: 0.3 x10 3/mm (ref 0.0–0.7)
Eosinophil %: 3.7 %
HCT: 38.8 % (ref 35.0–47.0)
HGB: 13.1 g/dL (ref 12.0–16.0)
LYMPHS ABS: 1.6 x10 3/mm (ref 1.0–3.6)
LYMPHS PCT: 20.7 %
MCH: 30 pg (ref 26.0–34.0)
MCHC: 33.7 g/dL (ref 32.0–36.0)
MCV: 89 fL (ref 80–100)
MONO ABS: 0.3 x10 3/mm (ref 0.2–0.9)
MONOS PCT: 4.4 %
NEUTROS ABS: 5.4 x10 3/mm (ref 1.4–6.5)
NEUTROS PCT: 70.9 %
PLATELETS: 150 x10 3/mm (ref 150–440)
RBC: 4.36 10*6/uL (ref 3.80–5.20)
RDW: 15.5 % — ABNORMAL HIGH (ref 11.5–14.5)
WBC: 7.6 x10 3/mm (ref 3.6–11.0)

## 2014-02-24 LAB — CA 125: CA 125: 16.9 U/mL (ref 0.0–34.0)

## 2014-03-02 LAB — COMPREHENSIVE METABOLIC PANEL
ALT: 54 U/L
ANION GAP: 8 (ref 7–16)
AST: 35 U/L (ref 15–37)
Albumin: 3.5 g/dL (ref 3.4–5.0)
Alkaline Phosphatase: 94 U/L
BILIRUBIN TOTAL: 0.5 mg/dL (ref 0.2–1.0)
BUN: 16 mg/dL (ref 7–18)
CALCIUM: 9.8 mg/dL (ref 8.5–10.1)
Chloride: 101 mmol/L (ref 98–107)
Co2: 34 mmol/L — ABNORMAL HIGH (ref 21–32)
Creatinine: 0.95 mg/dL (ref 0.60–1.30)
EGFR (African American): >60
EGFR (Non-African Amer.): >60
GLUCOSE: 96 mg/dL (ref 65–99)
Osmolality: 286 (ref 275–301)
Potassium: 3.4 mmol/L — ABNORMAL LOW (ref 3.5–5.1)
SODIUM: 143 mmol/L (ref 136–145)
Total Protein: 7.2 g/dL (ref 6.4–8.2)

## 2014-03-02 LAB — CBC CANCER CENTER
BASOS ABS: 0 x10 3/mm (ref 0.0–0.1)
Basophil %: 0.6 %
EOS ABS: 0.2 x10 3/mm (ref 0.0–0.7)
Eosinophil %: 3.3 %
HCT: 40.3 % (ref 35.0–47.0)
HGB: 13.6 g/dL (ref 12.0–16.0)
Lymphocyte #: 1.6 x10 3/mm (ref 1.0–3.6)
Lymphocyte %: 21.4 %
MCH: 29.8 pg (ref 26.0–34.0)
MCHC: 33.6 g/dL (ref 32.0–36.0)
MCV: 89 fL (ref 80–100)
MONOS PCT: 6.1 %
Monocyte #: 0.4 x10 3/mm (ref 0.2–0.9)
Neutrophil #: 5 x10 3/mm (ref 1.4–6.5)
Neutrophil %: 68.6 %
Platelet: 161 x10 3/mm (ref 150–440)
RBC: 4.55 10*6/uL (ref 3.80–5.20)
RDW: 15.6 % — AB (ref 11.5–14.5)
WBC: 7.3 x10 3/mm (ref 3.6–11.0)

## 2014-03-02 LAB — MAGNESIUM: MAGNESIUM: 1.6 mg/dL — AB

## 2014-03-03 LAB — CA 125: CA 125: 15 U/mL (ref 0.0–34.0)

## 2014-03-21 ENCOUNTER — Ambulatory Visit: Payer: Self-pay | Admitting: Oncology

## 2014-04-21 ENCOUNTER — Ambulatory Visit: Payer: Self-pay | Admitting: Oncology

## 2014-04-29 DIAGNOSIS — Z9289 Personal history of other medical treatment: Secondary | ICD-10-CM

## 2014-04-29 HISTORY — DX: Personal history of other medical treatment: Z92.89

## 2014-05-22 ENCOUNTER — Ambulatory Visit: Payer: Self-pay | Admitting: Oncology

## 2014-05-24 LAB — COMPREHENSIVE METABOLIC PANEL WITH GFR
Albumin: 3.8 g/dL
Alkaline Phosphatase: 98 U/L
Anion Gap: 13
BUN: 19 mg/dL — ABNORMAL HIGH
Bilirubin,Total: 0.5 mg/dL
Calcium, Total: 9.6 mg/dL
Chloride: 97 mmol/L — ABNORMAL LOW
Co2: 30 mmol/L
Creatinine: 1.55 mg/dL — ABNORMAL HIGH
EGFR (African American): 42 — ABNORMAL LOW
EGFR (Non-African Amer.): 35 — ABNORMAL LOW
Glucose: 152 mg/dL — ABNORMAL HIGH
Osmolality: 285
Potassium: 3.3 mmol/L — ABNORMAL LOW
SGOT(AST): 30 U/L
SGPT (ALT): 55 U/L
Sodium: 140 mmol/L
Total Protein: 7.6 g/dL

## 2014-05-24 LAB — CBC CANCER CENTER
BASOS ABS: 0 x10 3/mm (ref 0.0–0.1)
BASOS PCT: 0.5 %
EOS PCT: 1.8 %
Eosinophil #: 0.2 x10 3/mm (ref 0.0–0.7)
HCT: 41.4 % (ref 35.0–47.0)
HGB: 14.2 g/dL (ref 12.0–16.0)
LYMPHS PCT: 18.4 %
Lymphocyte #: 1.6 x10 3/mm (ref 1.0–3.6)
MCH: 30 pg (ref 26.0–34.0)
MCHC: 34.2 g/dL (ref 32.0–36.0)
MCV: 88 fL (ref 80–100)
MONOS PCT: 3.2 %
Monocyte #: 0.3 x10 3/mm (ref 0.2–0.9)
NEUTROS ABS: 6.5 x10 3/mm (ref 1.4–6.5)
Neutrophil %: 76.1 %
Platelet: 153 x10 3/mm (ref 150–440)
RBC: 4.72 10*6/uL (ref 3.80–5.20)
RDW: 15.7 % — ABNORMAL HIGH (ref 11.5–14.5)
WBC: 8.6 x10 3/mm (ref 3.6–11.0)

## 2014-05-25 LAB — CA 125: CA 125: 14.1 U/mL (ref 0.0–34.0)

## 2014-06-20 ENCOUNTER — Ambulatory Visit
Admit: 2014-06-20 | Disposition: A | Discharge status: Home / Self Care | Payer: Self-pay | Attending: Oncology | Admitting: Oncology

## 2014-07-21 ENCOUNTER — Ambulatory Visit
Admit: 2014-07-21 | Disposition: A | Discharge status: Home / Self Care | Payer: Self-pay | Attending: Oncology | Admitting: Oncology

## 2014-08-11 NOTE — Op Note (Signed)
PATIENT NAME:  MYRLE, WANEK MR#:  [id number] DATE OF BIRTH:  01-Sep-1940  DATE OF PROCEDURE:  12/21/2012  PREOPERATIVE DIAGNOSIS: Pelvic mass.   POSTOPERATIVE DIAGNOSIS: Carcinosarcoma of the ovary.   PROCEDURES PERFORMED:  1.  Exploratory laparotomy. 2.  Total abdominal hysterectomy with bilateral salpingo-oophorectomy.  3.  Appendectomy.  4.  Omentectomy.  5.  pelvic LAD, para-aortic nodes sampling, 6.   Staging biopsies.   CO-SURGEONS: Jacquelyne Balint, MD and Donzetta Matters, MD     ANESTHESIA: General.   COMPLICATIONS: None.   INDICATION FOR SURGERY: Mrs. Percifield is a 74 year old patient who presented with a pelvic mass. This was evaluated by ultrasound and CT scanning, and decision was made to proceed with surgery.   FINDINGS AT TIME OF SURGERY: Uterus of normal form and size. Left ovary within normal limits. Right ovary significantly enlarged, and per statements of Dr. Ferne Reus no excrescences or papillation. Inspection of the retroperitoneum and abdomen was unremarkable.  No macroscopic residual at the end of the procedure.  OPERATIVE REPORT: Dr. Ferne Reus has already dictated the TAH/BSO.   Careful inspection was done, and there was no macroscopic evidence of extra-ovarian disease. The incision was extended a bit cephalic.  Then, a Bookwalter Retractor was placed.   First, attention was directed towards the omentectomy. The omentum was freed from the transverse colon. Then, using the Harmonic scalpel, pedicles were dissected, cauterized and cut until a complete infracolic omentectomy was performed. Inspection of the lesser sac and palpation of the lesser sac was unremarkable.  Attention was then directed towards the staging biopsies which were taken from large and small bowel, abdominal wall and pelvis.   Then the appendectomy was done by transecting the mesentery ileum with the Harmonic scalpel, ligating the  appendiceal stump twice, and after the appendix was removed,  the stump was buried in a pursestring suture. Hemostasis was adequate at the end of the procedure.   The pelvic lymphadenectomy was done in similar fashion on each side. Vessels and ureter were identified as well as the obturator nerve. Using the Harmonic scalpel the node-bearing fatty tissue around the common iliac artery and the external and the hypogastric artery as well as from the obturator space was freed up and removed. Ureter, vessels and obturator nerve were kept under constant visualization. Hemostasis was adequate at the end of the procedure.  Finally, a para-aortic node biopsy was done, again using the Harmonic scalpel. Due to the patient's significant obesity, this was difficult to complete; therefore, only a biopsy was taken. Hemostasis was adequate.  Irrigation was performed and adequate hemostasis was noted after the procedure in all areas. Gelfoam with thrombin was placed in the areas of nodal dissection.   Bookwalter and lap sponges were removed. The fascia was closed with a running #1 PDS suture starting superiorly and inferiorly and joining in the middle. Irrigation of the subcutaneous tissue was done and adequate hemostasis confirmed before it was reapproximated with 2-0 Vicryl. A 3-0 Monocryl suture was used to close the skin.   The patient tolerated the procedure well and was taken to the recovery room in satisfactory condition. The postoperative urine was clear. Pad, sponge, needle and instrument counts were correct x 2.   ____________________________ Weber Cooks, MD bem:cb D: 12/21/2012 16:17:25 ET T: 12/21/2012 16:48:00 ET JOB#: [id number]  cc: Weber Cooks, MD, <Dictator> Weber Cooks MD ELECTRONICALLY SIGNED 12/21/2012 18:45

## 2014-08-11 NOTE — Op Note (Signed)
    Weber Cooks MD ELECTRONICALLY SIGNED 12/21/2012 18:45

## 2014-08-11 NOTE — Op Note (Signed)
PATIENT NAME:  Leah Santos, Leah Santos MR#:  [id number] DATE OF BIRTH:  11-10-1940  DATE OF PROCEDURE:  12/21/2012  PREOPERATIVE DIAGNOSIS: Postmenopausal bleeding and pelvic mass.   POSTOPERATIVE DIAGNOSIS: Postmenopausal bleeding and pelvic mass.   PROCEDURE: Total abdominal hysterectomy, bilateral salpingo-oophorectomy and cancer staging by Dr. Sabra Heck.   ANESTHESIA:  General.   SURGEON: Donzetta Matters, M.D.   ASSISTANT: Erik Obey, M.D.   ESTIMATED BLOOD LOSS:  450 mL.   OPERATIVE FLUIDS: 2500 mL.   COMPLICATIONS: None.   FINDINGS: Large 10 cm complex left ovarian mass, normal-appearing right ovary, normal-appearing tubes and uterus.   SPECIMENS:  1.  Uterus with cervix and right tube and ovary.  2.  Left tube and ovary were sent for frozen section.   INDICATIONS: The patient is a 74 year old who presents with postmenopausal bleeding and was found to have a large 11 cm pelvic mass by ultrasound. The patient is brought to the operating room for surgical evaluation and management. Risks, benefits, indications and alternatives of the procedure were explained and informed consent was obtained.   PROCEDURE: The patient was taken to the operating room with IV fluids running. She was prepped and draped in the usual sterile fashion in the supine position. Foley catheter was placed. Attention was turned to the patient's abdomen where a midline incision was made. The incision was carried down to underlying fascia both bluntly and with the knife. The fascia was incised, and the opening was extended using Metzenbaum scissors. The peritoneum was entered sharply, and then this opening was bluntly extended. The Balfour retractor with the upper extender arm was placed. The bowels were packed back with moist laparotomy sponges. The large left ovarian mass was encountered at this point. The mass was grasped and delivered through the abdominal incision. The left infundibulopelvic ligament was doubly clamped  with curved Heaney's, cut and suture ligated. The tubes and utero-ovarian ligaments were likewise doubly clamped, cut and suture ligated, and the mass was removed from the pelvis and sent for frozen section evaluation. The remainder of the procedure was done as follows: The right round ligament was suture ligated x2 using #0 Vicryl and was transected using Bovie cautery. The anterior and posterior leaves of the broad ligament were then divided and a portion of the anterior leaf of the broad ligament was taken down using Bovie cautery. The infundibulopelvic ligament on the right side was doubly clamped, cut and suture ligated, and this included a portion of the posterior leaf of the broad ligament. The uterine artery was partially skeletonized on the right, and the uterine artery was clamped, cut and suture ligated. This was done in successive bites using both curved and straight Heaney clamps. Attention was turned to the patient's left side, where the left tube and ovary had previously been removed; therefore, the round ligament was doubly clamped, cut and suture ligated. The anterior leaf of the broad ligament was then transected, and a bladder flap was created. The left broad ligament was entered and the uterine artery was skeletonized on the left side. The broad ligament and the uterine artery were clamped, cut and suture ligated in successive bites using both curved and straight Heaney clamps. It was ensured that the bladder flap was completely out of the surgical field, and this was done using both blunt and sharp dissection. A curved clamp was placed up under the cervix on both sides of the cervix laterally. The bites were cut, and the uterus and cervix were removed from the pelvis and  handed off the table. The vaginal cuff was then grasped with Kocher clamps. The curved Heaney's that had been placed on the vaginal cuff were suture ligated. The vaginal cuff was then closed using #0 Vicryl in a figure-of-eight  fashion. At this point, pathology called concerning the results of frozen section, which are preliminary, and they did show malignancy; therefore, Dr. Jacquelyne Balint was called in to complete cancer staging. Please see her dictated operative report for further information. The abdomen was then closed using #1 PDS, and the skin was closed with staples. The patient tolerated the procedure well. Sponge, needle and instrument counts were correct x 2, and the patient was awakened from anesthesia and taken to the recovery room in stable condition.     ____________________________ Rolm Gala Ferne Reus, MD law:dmm D: 12/21/2012 13:33:55 ET T: 12/21/2012 13:54:02 ET JOB#: [id number]  cc: Sherlynn Carbon A. Ferne Reus, MD, <Dictator> Rolm Gala WEAVER LEE MD ELECTRONICALLY SIGNED 01/05/2013 21:41

## 2014-08-11 NOTE — Op Note (Signed)
PATIENT NAME:  Leah Santos, KAUZLARICH MR#:  [id number] DATE OF BIRTH:  04-09-1941  DATE OF PROCEDURE:  01/27/2013  PREOPERATIVE DIAGNOSIS: Ovarian cancer.   POSTOPERATIVE DIAGNOSIS: Ovarian cancer.   PROCEDURE: Insertion of central venous catheter with subcutaneous infusion port.   SURGEON: Loreli Dollar, MD  ANESTHESIA: General.   INDICATIONS: This 74 year old female has history of ovarian cancer, now needing central venous access for chemotherapy.   DESCRIPTION OF PROCEDURE: The patient was placed on the operating table in the supine position and was sedated by the anesthesia staff; however, breathing stopped, and subsequently used some respiratory assistance, and was essentially under general anesthesia. A rolled sheet was placed behind her shoulder blades so that the neck was extended. The neck was also turned approximately 30 degrees to the left. The neck was examined with ultrasound, demonstrating presence of the jugular vein and the carotid artery on the right side. Next, the site was prepared with ChloraPrep and draped in a sterile manner.   The skin beneath the clavicle was infiltrated with 1% Xylocaine and made a transversely oriented 3 cm incision, carried down through subcutaneous tissues and created a subcutaneous pouch just anterior to the deep fascia inferior to the incision large enough to admit the [place] port. The patient was placed in Trendelenburg position. The ultrasound was placed into a sterile sleeve and further examined the jugular vein, demonstrating the carotid artery. Next, the skin overlying the jugular vein on the right side of the neck was infiltrated with 1% Xylocaine. A transversely oriented 6 mm incision was made, and with the patient in the Trendelenburg position, the needle was advanced into the jugular vein using ultrasound guidance, and then advanced a guidewire down into the vena cava. Momentarily had premature atrial contractions, and the catheter was pulled back a  few centimeters, and premature atrial contractions resolved. The ultrasound image was saved for the paper chart. Next, fluoroscopy was used to demonstrate position of the guidewire in the vena cava. The needle was withdrawn. The dilator and introducer sheath were advanced over the guidewire. The guidewire was removed. Subsequently, the dilator was removed, and the catheter was advanced through the sheath, and the sheath was peeled away. The catheter was pulled back to some 13 cm from the incision and examined with fluoroscopy, demonstrating the tip of the catheter in the superior vena cava. A fluoroscopic image was saved for the paper chart. Next, the catheter was tunneled down to the subclavian port. Pressure was held over the tunnel site. The catheter was cut to fit and was attached to the [place] port, using the accompanying sleeve to secure it. The port was accessed with Charisse March needle and aspirated a trace of blood and flushed with 10 mL of saline. The port was placed into the subcutaneous pouch and was sutured to the deep fascia with 4-0 silk. It was noted that hemostasis was intact. The subcutaneous tissues were infiltrated with Xylocaine with epinephrine. The pouch was closed with 5-0 Vicryl, and both skin incisions were closed with 5-0 Vicryl subcuticular suture and Dermabond. The patient tolerated surgery satisfactorily, although when she was first anesthetized, needed some respiratory assistance, but subsequently was in satisfactory condition and was prepared for transfer to the recovery room.   ____________________________ Lenna Sciara. Rochel Brome, MD jws:OSi D: 01/27/2013 13:57:07 ET T: 01/27/2013 14:10:06 ET JOB#: [id number]  cc: Loreli Dollar, MD, <Dictator> Loreli Dollar MD ELECTRONICALLY SIGNED 01/28/2013 19:09

## 2014-08-18 ENCOUNTER — Other Ambulatory Visit: Payer: Self-pay | Admitting: *Deleted

## 2014-08-23 ENCOUNTER — Ambulatory Visit: Payer: Self-pay

## 2014-08-23 ENCOUNTER — Other Ambulatory Visit: Payer: Self-pay

## 2014-09-29 ENCOUNTER — Encounter: Payer: Self-pay | Admitting: *Deleted

## 2014-09-29 DIAGNOSIS — C562 Malignant neoplasm of left ovary: Secondary | ICD-10-CM

## 2014-10-03 ENCOUNTER — Encounter: Payer: Self-pay | Admitting: *Deleted

## 2014-10-04 ENCOUNTER — Inpatient Hospital Stay: Payer: Medicare Other

## 2014-10-04 ENCOUNTER — Inpatient Hospital Stay: Payer: Medicare Other | Attending: Obstetrics and Gynecology | Admitting: Obstetrics and Gynecology

## 2014-10-04 ENCOUNTER — Encounter: Payer: Self-pay | Admitting: *Deleted

## 2014-10-04 ENCOUNTER — Encounter (INDEPENDENT_AMBULATORY_CARE_PROVIDER_SITE_OTHER): Payer: Self-pay

## 2014-10-04 VITALS — BP 139/83 | HR 68 | Temp 97.1°F | Resp 20 | Ht 63.0 in | Wt 188.9 lb

## 2014-10-04 DIAGNOSIS — C562 Malignant neoplasm of left ovary: Secondary | ICD-10-CM

## 2014-10-04 DIAGNOSIS — I898 Other specified noninfective disorders of lymphatic vessels and lymph nodes: Secondary | ICD-10-CM | POA: Insufficient documentation

## 2014-10-04 DIAGNOSIS — Z90710 Acquired absence of both cervix and uterus: Secondary | ICD-10-CM

## 2014-10-04 DIAGNOSIS — Z86010 Personal history of colonic polyps: Secondary | ICD-10-CM | POA: Insufficient documentation

## 2014-10-04 DIAGNOSIS — Z803 Family history of malignant neoplasm of breast: Secondary | ICD-10-CM

## 2014-10-04 DIAGNOSIS — J45909 Unspecified asthma, uncomplicated: Secondary | ICD-10-CM | POA: Insufficient documentation

## 2014-10-04 DIAGNOSIS — Z800 Family history of malignant neoplasm of digestive organs: Secondary | ICD-10-CM | POA: Diagnosis not present

## 2014-10-04 DIAGNOSIS — Z8041 Family history of malignant neoplasm of ovary: Secondary | ICD-10-CM

## 2014-10-04 DIAGNOSIS — Z79899 Other long term (current) drug therapy: Secondary | ICD-10-CM | POA: Diagnosis not present

## 2014-10-04 DIAGNOSIS — Z9221 Personal history of antineoplastic chemotherapy: Secondary | ICD-10-CM | POA: Diagnosis not present

## 2014-10-04 DIAGNOSIS — Z452 Encounter for adjustment and management of vascular access device: Secondary | ICD-10-CM | POA: Insufficient documentation

## 2014-10-04 DIAGNOSIS — E039 Hypothyroidism, unspecified: Secondary | ICD-10-CM | POA: Diagnosis not present

## 2014-10-04 DIAGNOSIS — K219 Gastro-esophageal reflux disease without esophagitis: Secondary | ICD-10-CM | POA: Insufficient documentation

## 2014-10-04 DIAGNOSIS — C569 Malignant neoplasm of unspecified ovary: Secondary | ICD-10-CM

## 2014-10-04 DIAGNOSIS — E669 Obesity, unspecified: Secondary | ICD-10-CM | POA: Diagnosis not present

## 2014-10-04 DIAGNOSIS — K229 Disease of esophagus, unspecified: Secondary | ICD-10-CM | POA: Diagnosis not present

## 2014-10-04 DIAGNOSIS — I10 Essential (primary) hypertension: Secondary | ICD-10-CM | POA: Insufficient documentation

## 2014-10-04 DIAGNOSIS — Z7951 Long term (current) use of inhaled steroids: Secondary | ICD-10-CM | POA: Diagnosis not present

## 2014-10-04 DIAGNOSIS — Z8543 Personal history of malignant neoplasm of ovary: Secondary | ICD-10-CM | POA: Insufficient documentation

## 2014-10-04 DIAGNOSIS — B377 Candidal sepsis: Secondary | ICD-10-CM | POA: Insufficient documentation

## 2014-10-04 NOTE — Patient Instructions (Signed)
Calorie Counting for Weight Loss Calories are energy you get from the things you eat and drink. Your body uses this energy to keep you going throughout the day. The number of calories you eat affects your weight. When you eat more calories than your body needs, your body stores the extra calories as fat. When you eat fewer calories than your body needs, your body burns fat to get the energy it needs. Calorie counting means keeping track of how many calories you eat and drink each day. If you make sure to eat fewer calories than your body needs, you should lose weight. In order for calorie counting to work, you will need to eat the number of calories that are right for you in a day to lose a healthy amount of weight per week. A healthy amount of weight to lose per week is usually 1-2 lb (0.5-0.9 kg). A dietitian can determine how many calories you need in a day and give you suggestions on how to reach your calorie goal.  WHAT IS MY MY PLAN? My goal is to have __________ calories per day.  If I have this many calories per day, I should lose around __________ pounds per week. WHAT DO I NEED TO KNOW ABOUT CALORIE COUNTING? In order to meet your daily calorie goal, you will need to:  Find out how many calories are in each food you would like to eat. Try to do this before you eat.  Decide how much of the food you can eat.  Write down what you ate and how many calories it had. Doing this is called keeping a food log. WHERE DO I FIND CALORIE INFORMATION? The number of calories in a food can be found on a Nutrition Facts label. Note that all the information on a label is based on a specific serving of the food. If a food does not have a Nutrition Facts label, try to look up the calories online or ask your dietitian for help. HOW DO I DECIDE HOW MUCH TO EAT? To decide how much of the food you can eat, you will need to consider both the number of calories in one serving and the size of one serving. This  information can be found on the Nutrition Facts label. If a food does not have a Nutrition Facts label, look up the information online or ask your dietitian for help. Remember that calories are listed per serving. If you choose to have more than one serving of a food, you will have to multiply the calories per serving by the amount of servings you plan to eat. For example, the label on a package of bread might say that a serving size is 1 slice and that there are 90 calories in a serving. If you eat 1 slice, you will have eaten 90 calories. If you eat 2 slices, you will have eaten 180 calories. HOW DO I KEEP A FOOD LOG? After each meal, record the following information in your food log:  What you ate.  How much of it you ate.  How many calories it had.  Then, add up your calories. Keep your food log near you, such as in a small notebook in your pocket. Another option is to use a mobile app or website. Some programs will calculate calories for you and show you how many calories you have left each time you add an item to the log. WHAT ARE SOME CALORIE COUNTING TIPS?  Use your calories on foods   and drinks that will fill you up and not leave you hungry. Some examples of this include foods like nuts and nut butters, vegetables, lean proteins, and high-fiber foods (more than 5 g fiber per serving).  Eat nutritious foods and avoid empty calories. Empty calories are calories you get from foods or beverages that do not have many nutrients, such as candy and soda. It is better to have a nutritious high-calorie food (such as an avocado) than a food with few nutrients (such as a bag of chips).  Know how many calories are in the foods you eat most often. This way, you do not have to look up how many calories they have each time you eat them.  Look out for foods that may seem like low-calorie foods but are really high-calorie foods, such as baked goods, soda, and fat-free candy.  Pay attention to calories  in drinks. Drinks such as sodas, specialty coffee drinks, alcohol, and juices have a lot of calories yet do not fill you up. Choose low-calorie drinks like water and diet drinks.  Focus your calorie counting efforts on higher calorie items. Logging the calories in a garden salad that contains only vegetables is less important than calculating the calories in a milk shake.  Find a way of tracking calories that works for you. Get creative. Most people who are successful find ways to keep track of how much they eat in a day, even if they do not count every calorie. WHAT ARE SOME PORTION CONTROL TIPS?  Know how many calories are in a serving. This will help you know how many servings of a certain food you can have.  Use a measuring cup to measure serving sizes. This is helpful when you start out. With time, you will be able to estimate serving sizes for some foods.  Take some time to put servings of different foods on your favorite plates, bowls, and cups so you know what a serving looks like.  Try not to eat straight from a bag or box. Doing this can lead to overeating. Put the amount you would like to eat in a cup or on a plate to make sure you are eating the right portion.  Use smaller plates, glasses, and bowls to prevent overeating. This is a quick and easy way to practice portion control. If your plate is smaller, less food can fit on it.  Try not to multitask while eating, such as watching TV or using your computer. If it is time to eat, sit down at a table and enjoy your food. Doing this will help you to start recognizing when you are full. It will also make you more aware of what and how much you are eating. HOW CAN I CALORIE COUNT WHEN EATING OUT?  Ask for smaller portion sizes or child-sized portions.  Consider sharing an entree and sides instead of getting your own entree.  If you get your own entree, eat only half. Ask for a box at the beginning of your meal and put the rest of your  entree in it so you are not tempted to eat it.  Look for the calories on the menu. If calories are listed, choose the lower calorie options.  Choose dishes that include vegetables, fruits, whole grains, low-fat dairy products, and lean protein. Focusing on smart food choices from each of the 5 food groups can help you stay on track at restaurants.  Choose items that are boiled, broiled, grilled, or steamed.  Choose   water, milk, unsweetened iced tea, or other drinks without added sugars. If you want an alcoholic beverage, choose a lower calorie option. For example, a regular margarita can have up to 700 calories and a glass of wine has around 150.  Stay away from items that are buttered, battered, fried, or served with cream sauce. Items labeled "crispy" are usually fried, unless stated otherwise.  Ask for dressings, sauces, and syrups on the side. These are usually very high in calories, so do not eat much of them.  Watch out for salads. Many people think salads are a healthy option, but this is often not the case. Many salads come with bacon, fried chicken, lots of cheese, fried chips, and dressing. All of these items have a lot of calories. If you want a salad, choose a garden salad and ask for grilled meats or steak. Ask for the dressing on the side, or ask for olive oil and vinegar or lemon to use as dressing.  Estimate how many servings of a food you are given. For example, a serving of cooked rice is  cup or about the size of half a tennis ball or one cupcake wrapper. Knowing serving sizes will help you be aware of how much food you are eating at restaurants. The list below tells you how big or small some common portion sizes are based on everyday objects.  1 oz--4 stacked dice.  3 oz--1 deck of cards.  1 tsp--1 dice.  1 Tbsp-- a Ping-Pong ball.  2 Tbsp--1 Ping-Pong ball.   cup--1 tennis ball or 1 cupcake wrapper.  1 cup--1 baseball. Document Released: 04/07/2005 Document  Revised: 08/22/2013 Document Reviewed: 02/10/2013 [facility] Patient Information 2015 [place], [place]. This information is not intended to replace advice given to you by your health care provider. Make sure you discuss any questions you have with your health care provider.   Exercise to Lose Weight Exercise and a healthy diet may help you lose weight. Your doctor may suggest specific exercises. EXERCISE IDEAS AND TIPS  Choose low-cost things you enjoy doing, such as walking, bicycling, or exercising to workout videos.  Take stairs instead of the elevator.  Walk during your lunch break.  Park your car further away from work or school.  Go to a gym or an exercise class.  Start with 5 to 10 minutes of exercise each day. Build up to 30 minutes of exercise 4 to 6 days a week.  Wear shoes with good support and comfortable clothes.  Stretch before and after working out.  Work out until you breathe harder and your heart beats faster.  Drink extra water when you exercise.  Do not do so much that you hurt yourself, feel dizzy, or get very short of breath. Exercises that burn about 150 calories:  Running 1  miles in 15 minutes.  Playing volleyball for 45 to 60 minutes.  Washing and waxing a car for 45 to 60 minutes.  Playing touch football for 45 minutes.  Walking 1  miles in 35 minutes.  Pushing a stroller 1  miles in 30 minutes.  Playing basketball for 30 minutes.  Raking leaves for 30 minutes.  Bicycling 5 miles in 30 minutes.  Walking 2 miles in 30 minutes.  Dancing for 30 minutes.  Shoveling snow for 15 minutes.  Swimming laps for 20 minutes.  Walking up stairs for 15 minutes.  Bicycling 4 miles in 15 minutes.  Gardening for 30 to 45 minutes.  Jumping rope for 15 minutes.  Washing windows or floors for 45 to 60 minutes. Document Released: 05/10/2010 Document Revised: 06/30/2011 Document Reviewed: 05/10/2010 [facility] Patient Information 2015 [place],  [place]. This information is not intended to replace advice given to you by your health care provider. Make sure you discuss any questions you have with your health care provider.

## 2014-10-04 NOTE — Progress Notes (Addendum)
Gynecologic Oncology Interval Visit   Primary Care Provider: Rusty Aus, MD [facility] [place],  [address] [phone]  Referring MD: Dr. Ferne Reus (no longer here)  Chief Concern: Ovarian cancer surveillance  Subjective:  Leah Santos is a 74 y.o. female who is seen for ovarian cancer surveillance.   Lab Results  Component Value Date   CA125 14.1 05/24/2014   Mammogram: 05/09/2014 BI-RADS CATEGORY  1: Negative.  Last breast exam: 02/2014 negative per patient  Pelvic MRI 11/21/2013  FINDINGS: Previous hysterectomy noted. Simple appearing cyst in the right adnexa shows significant decrease in size since previous study, currently measuring 1.2 x 2.5 cm on image 23 of series 9 compared to 4.5 x 5.9 cm previously. This is consistent with a resolving postoperative fluid collection such as a lymphocele or seroma. Another simple appearing cyst in the left adnexa measures 3.0 x 4.4 cm. This remains stable in size and appearance since prior exam, and is also suspicious for a postoperative fluid collection. No internal septations or solid mural nodules are visualized.  No other pelvic masses or fluid collections are identified. No evidence of pelvic lymphadenopathy. No evidence of acute inflammatory process or dilated bowel loops.   IMPRESSION: No acute findings or radiographic signs of pelvic metastatic disease.  Near complete resolution of right adnexal fluid collection, consistent with resolving postoperative lymphocele or seroma.  Stable simple appearing left adnexal cyst, most consistent with persistent postoperative lymphocele or seroma.   Gynecologic Oncologym History Leah Santos was diagnosed with left ovarian cancer, adenocarcinoma.pT2 pNO  M0, FIGOSTAGING II B. Status post bilateral oopherectomy, omentectomy, lymph node dissection on December 21, 2012.  -Started on carboplatinum and Taxol from  October, 2014. -Allergic reaction to Taxol on 2nd treatment.  Taxol was discontinued and started on gemcitabine -Patient has finished total 6 cycles of chemotherapy(initially with carboplatinum Taxol followed by carboplatinum and gemcitabine(February, 2015).  Genetic testing: No BRCA mutation.  Health care maintenance: We order her mammograms and perform cinical breast exams.   Problem List: Patient Active Problem List   Diagnosis Date Noted  . H/O ovarian cancer 02/16/2014    Past Medical History: Past Medical History  Diagnosis Date  . GERD (gastroesophageal reflux disease)   . Hypothyroidism   . Barrett's esophagus   . History of colon polyps   . Ovarian cancer on left     No BRCA mutation-Left ovarian cancer, adenocarcinoma.pT2 pNO  M0   . History of chemotherapy     finished total 6 cycles of chemotherapy(initially with carboplatinum Taxol followed by carboplatinum and gemcitabine(February, 2015)  . History of mammogram 04/29/2014  . History of colonoscopy with polypectomy 11/07/13  . History of cellulitis   . History of genital warts 1986  . Asthma   . HTN (hypertension)   . IBS (irritable bowel syndrome)     Past Surgical History: Past Surgical History  Procedure Laterality Date  . Exp. laparotomy, tah, salpingo-oophorectomy, appendectomy  12/2012  . Cholecystectomy  1996  . Breast lumpectomy  1962  . Colonoscopy w/ polypectomy  11/07/13  . Esophagogastroduodenoscopy endoscopy  11/07/13  . Hemorrhoid surgery  1963  . Tubal ligation      Past Gynecologic History:  As per HPI  OB History:  OB History  Gravida Para Term Preterm AB SAB TAB Ectopic Multiple Living  $Remov'3 3 3           'nhAekT$ # Outcome Date GA Lbr Len/2nd Weight Sex Delivery Anes PTL  Lv  3 Term           2 Term           1 Term             Obstetric Comments  Onset of menses at age 43    Family History: Family History  Problem Relation Age of Onset  . Ovarian cancer Mother 75  . Ovarian cancer  Maternal Aunt 38  . Breast cancer Maternal Aunt 70  . Lung cancer Maternal Uncle 75  . Prostate cancer Maternal Uncle 65  . Uterine cancer Maternal Aunt 24    Social History: History   Social History  . Marital Status: Married    Spouse Name: N/A  . Number of Children: N/A  . Years of Education: N/A   Occupational History  . Not on file.   Social History Main Topics  . Smoking status: Never Smoker   . Smokeless tobacco: Not on file  . Alcohol Use: Not on file  . Drug Use: No  . Sexual Activity: Not on file   Other Topics Concern  . Not on file   Social History Narrative    Allergies: Allergies  Allergen Reactions  . Statins Other (See Comments)    Muscle cramps  . Taxol [Paclitaxel]     Allergic reaction to Taxol ON 2nd treatment.  Taxol was discontinued and started on gemcitabine   . Sulfa Antibiotics Rash  . Tape Rash    Skin irritation with prolonged use (bandages with latex)    Current Medications: Current Outpatient Prescriptions  Medication Sig Dispense Refill  . acetaminophen (TYLENOL) 325 MG tablet Take 650 mg by mouth every 6 (six) hours as needed for mild pain.    Marland Kitchen albuterol (ACCUNEB) 0.63 MG/3ML nebulizer solution Take 1 ampule by nebulization every 6 (six) hours as needed for wheezing or shortness of breath.    Marland Kitchen albuterol (PROAIR HFA) 108 (90 BASE) MCG/ACT inhaler Inhale into the lungs.    . Cholecalciferol (VITAMIN D3) 1000 UNITS CAPS Take 1 tablet by mouth daily.    . Fluticasone-Salmeterol (ADVAIR) 100-50 MCG/DOSE AEPB Inhale 1 puff into the lungs 2 (two) times daily.    Marland Kitchen ibuprofen (ADVIL,MOTRIN) 800 MG tablet Take 800 mg by mouth every 8 (eight) hours as needed for mild pain.    Marland Kitchen levothyroxine (SYNTHROID, LEVOTHROID) 50 MCG tablet Take 50 mcg by mouth daily before breakfast.    . mometasone (NASONEX) 50 MCG/ACT nasal spray Place 2 sprays into the nose daily.    . Multiple Vitamins-Minerals (CENTRUM SILVER ADULT 50+ PO) Take 1 tablet by mouth  daily.    . potassium chloride (KLOR-CON 10) 10 MEQ tablet Take 3 tablets by mouth daily.    . Probiotic Product (PROBIOTIC FORMULA PO) Take 1 capsule by mouth daily.    . sertraline (ZOLOFT) 100 MG tablet Take 1 tablet by mouth daily.    Marland Kitchen torsemide (DEMADEX) 20 MG tablet Take 20 mg by mouth daily.    Marland Kitchen triamterene-hydrochlorothiazide (MAXZIDE-25) 37.5-25 MG per tablet Take 1 tablet by mouth daily.    . Cholecalciferol (VITAMIN D-3 PO) Take 1 tablet by mouth daily.     No current facility-administered medications for this visit.    Review of Systems General: no complaints  HEENT: no complaints  Lungs: no complaints  Cardiac: no complaints  GI: no complaints  GU: no complaints  Musculoskeletal: no complaints  Extremities: no complaints  Skin: no complaints  Neuro: no complaints  Endocrine: no complaints  Psych: no complaints                           Objective:  Physical Examination:  BP 139/83 mmHg  Pulse 68  Temp(Src) 97.1 F (36.2 C) (Tympanic)  Resp 20  Ht $R'5\' 3"'RV$  (1.6 m)  Wt 188 lb 15 oz (85.7 kg)  BMI 33.48 kg/m2   ECOG Performance Status: 0 - Asymptomatic  General appearance: alert, cooperative and appears stated age HEENT:PERRLA, extra ocular movement intact and sclera clear, anicteric Lymph node survey: non-palpable, axillary, inguinal, supraclavicular Cardiovascular: regular rate and rhythm Respiratory: normal air entry, lungs clear to auscultation Breast exam: Not done Abdomen: soft, non-tender, without masses or organomegaly, no hernias and well healed incision. No ascites Extremities: extremities normal, atraumatic, no cyanosis or edema Neurological exam reveals alert, oriented, normal speech, no focal findings or movement disorder noted.  Pelvic: exam chaperoned by nurse;  Vulva: normal appearing vulva with no masses, tenderness or lesions except for healing 1 cm erythematous lesion on the left buttock; Vagina: normal vagina; Adnexa: surgically  absent; Uterus: surgically absent, vaginal cuff well healed; Cervix: absent; Rectal: normal rectal, no masses    Lab Review Labs on site today: pending CA125  Radiologic Imaging: None    Assessment:  Leah Santos is a 74 y.o. female diagnosed with stage II   ovarian cancer. Bloating possibly secondary to IBS. Medical co-morbidities complicating care: HTN, obesity and prior abdominal surgery.  Plan:   Problem List Items Addressed This Visit    None    Visit Diagnoses    Ovarian cancer, unspecified laterality    -  Primary       Clinically NED. Follow up CA125. If elevated imaging.   She thinks her bloating may be secondary to the foods she is eating. She will alter her diet if no improvement in bloating then she will contact us and we will arrange CT scan abdomen and pelvis.   Follow up with Dr. Oliva Bustard in September. If NED we can lengthen surveillance to every 4 months.   Mammogram due January 2017.   We also discussed her weight and need for weight loss, stressed good nutrition, and exercise.    Gillis Ends, MD    CC:  Rusty Aus, MD [facility] [address], [place] [address] [phone]

## 2014-10-05 LAB — CA 125: CA 125: 17.8 U/mL (ref 0.0–38.1)

## 2014-10-10 ENCOUNTER — Inpatient Hospital Stay: Payer: Medicare Other

## 2014-10-10 ENCOUNTER — Telehealth: Payer: Self-pay | Admitting: *Deleted

## 2014-10-10 DIAGNOSIS — Z8543 Personal history of malignant neoplasm of ovary: Secondary | ICD-10-CM | POA: Diagnosis not present

## 2014-10-10 DIAGNOSIS — C801 Malignant (primary) neoplasm, unspecified: Secondary | ICD-10-CM

## 2014-10-10 MED ORDER — SODIUM CHLORIDE 0.9 % IJ SOLN
10.0000 mL | INTRAMUSCULAR | Status: DC | PRN
  Administered 2014-10-10: 10 mL
  Filled 2014-10-10: qty 10

## 2014-10-10 MED ORDER — HEPARIN SOD (PORK) LOCK FLUSH 100 UNIT/ML IV SOLN
500.0000 [IU] | Freq: Once | INTRAVENOUS | Status: AC
  Administered 2014-10-10: 500 [IU] via INTRAVENOUS
  Filled 2014-10-10: qty 5

## 2014-10-10 NOTE — Telephone Encounter (Signed)
Left voice msg for patient to call cancer center regarding lab result. Also sent msg through mychart.

## 2014-10-10 NOTE — Telephone Encounter (Signed)
-----   Message from [address], MD sent at 10/05/2014  5:01 PM EDT ----- Ileene Patrick,  Can you please review CA125 with her.  Thanks Angeles ----- Message -----    From: Lab In [facility] Interface    Sent: 10/05/2014   6:40 AM      To: Gillis Ends, MD

## 2014-11-02 ENCOUNTER — Telehealth: Payer: Self-pay | Admitting: *Deleted

## 2014-11-02 NOTE — Telephone Encounter (Signed)
Left msg on patient's vm at home phone. Reach out to patient and Call returned. Results normal.  I have previously left the same msg on patient's phone 2 weeks ago and sent her a msg through my chart Regarding her results.

## 2014-11-21 ENCOUNTER — Inpatient Hospital Stay: Payer: Medicare Other | Attending: Oncology

## 2014-11-21 DIAGNOSIS — Z8543 Personal history of malignant neoplasm of ovary: Secondary | ICD-10-CM | POA: Diagnosis not present

## 2014-11-21 DIAGNOSIS — C801 Malignant (primary) neoplasm, unspecified: Secondary | ICD-10-CM

## 2014-11-21 DIAGNOSIS — Z452 Encounter for adjustment and management of vascular access device: Secondary | ICD-10-CM | POA: Diagnosis not present

## 2014-11-21 MED ORDER — HEPARIN SOD (PORK) LOCK FLUSH 100 UNIT/ML IV SOLN
500.0000 [IU] | Freq: Once | INTRAVENOUS | Status: AC
  Administered 2014-11-21: 500 [IU] via INTRAVENOUS
  Filled 2014-11-21: qty 5

## 2014-11-21 MED ORDER — SODIUM CHLORIDE 0.9 % IJ SOLN
10.0000 mL | INTRAMUSCULAR | Status: AC | PRN
  Administered 2014-11-21: 10 mL via INTRAVENOUS
  Filled 2014-11-21: qty 10

## 2014-11-22 ENCOUNTER — Ambulatory Visit: Payer: Self-pay | Admitting: Oncology

## 2014-11-22 ENCOUNTER — Other Ambulatory Visit: Payer: Self-pay

## 2014-12-20 ENCOUNTER — Other Ambulatory Visit: Payer: Self-pay | Admitting: *Deleted

## 2014-12-20 DIAGNOSIS — Z8543 Personal history of malignant neoplasm of ovary: Secondary | ICD-10-CM

## 2014-12-27 ENCOUNTER — Inpatient Hospital Stay: Payer: Medicare Other | Attending: Oncology

## 2014-12-27 ENCOUNTER — Inpatient Hospital Stay (HOSPITAL_BASED_OUTPATIENT_CLINIC_OR_DEPARTMENT_OTHER): Payer: Medicare Other | Admitting: Oncology

## 2014-12-27 ENCOUNTER — Telehealth: Payer: Self-pay | Admitting: *Deleted

## 2014-12-27 ENCOUNTER — Encounter: Payer: Self-pay | Admitting: Oncology

## 2014-12-27 ENCOUNTER — Inpatient Hospital Stay: Payer: Medicare Other

## 2014-12-27 VITALS — BP 142/79 | HR 75 | Temp 96.9°F | Wt 188.5 lb

## 2014-12-27 DIAGNOSIS — Z803 Family history of malignant neoplasm of breast: Secondary | ICD-10-CM

## 2014-12-27 DIAGNOSIS — Z9221 Personal history of antineoplastic chemotherapy: Secondary | ICD-10-CM

## 2014-12-27 DIAGNOSIS — G4733 Obstructive sleep apnea (adult) (pediatric): Secondary | ICD-10-CM | POA: Insufficient documentation

## 2014-12-27 DIAGNOSIS — Z79899 Other long term (current) drug therapy: Secondary | ICD-10-CM | POA: Insufficient documentation

## 2014-12-27 DIAGNOSIS — Z801 Family history of malignant neoplasm of trachea, bronchus and lung: Secondary | ICD-10-CM | POA: Insufficient documentation

## 2014-12-27 DIAGNOSIS — Z8049 Family history of malignant neoplasm of other genital organs: Secondary | ICD-10-CM

## 2014-12-27 DIAGNOSIS — Z8543 Personal history of malignant neoplasm of ovary: Secondary | ICD-10-CM | POA: Diagnosis present

## 2014-12-27 DIAGNOSIS — K219 Gastro-esophageal reflux disease without esophagitis: Secondary | ICD-10-CM | POA: Diagnosis not present

## 2014-12-27 DIAGNOSIS — J45909 Unspecified asthma, uncomplicated: Secondary | ICD-10-CM | POA: Insufficient documentation

## 2014-12-27 DIAGNOSIS — I10 Essential (primary) hypertension: Secondary | ICD-10-CM | POA: Diagnosis not present

## 2014-12-27 DIAGNOSIS — Z86010 Personal history of colonic polyps: Secondary | ICD-10-CM | POA: Insufficient documentation

## 2014-12-27 DIAGNOSIS — K589 Irritable bowel syndrome without diarrhea: Secondary | ICD-10-CM

## 2014-12-27 DIAGNOSIS — E785 Hyperlipidemia, unspecified: Secondary | ICD-10-CM | POA: Insufficient documentation

## 2014-12-27 DIAGNOSIS — E876 Hypokalemia: Secondary | ICD-10-CM | POA: Diagnosis not present

## 2014-12-27 DIAGNOSIS — E039 Hypothyroidism, unspecified: Secondary | ICD-10-CM | POA: Diagnosis not present

## 2014-12-27 DIAGNOSIS — Z8041 Family history of malignant neoplasm of ovary: Secondary | ICD-10-CM | POA: Insufficient documentation

## 2014-12-27 DIAGNOSIS — Z1231 Encounter for screening mammogram for malignant neoplasm of breast: Secondary | ICD-10-CM

## 2014-12-27 DIAGNOSIS — C801 Malignant (primary) neoplasm, unspecified: Secondary | ICD-10-CM

## 2014-12-27 LAB — BASIC METABOLIC PANEL
ANION GAP: 9 (ref 5–15)
BUN: 18 mg/dL (ref 6–20)
CHLORIDE: 98 mmol/L — AB (ref 101–111)
CO2: 28 mmol/L (ref 22–32)
Calcium: 9 mg/dL (ref 8.9–10.3)
Creatinine, Ser: 0.87 mg/dL (ref 0.44–1.00)
Glucose, Bld: 212 mg/dL — ABNORMAL HIGH (ref 65–99)
POTASSIUM: 2.7 mmol/L — AB (ref 3.5–5.1)
SODIUM: 135 mmol/L (ref 135–145)

## 2014-12-27 LAB — CBC WITH DIFFERENTIAL/PLATELET
Basophils Absolute: 0 10*3/uL (ref 0–0.1)
Basophils Relative: 0 %
Eosinophils Absolute: 0.2 10*3/uL (ref 0–0.7)
Eosinophils Relative: 3 %
HCT: 40.1 % (ref 35.0–47.0)
Hemoglobin: 13.8 g/dL (ref 12.0–16.0)
Lymphocytes Relative: 21 %
Lymphs Abs: 1.4 10*3/uL (ref 1.0–3.6)
MCH: 30.7 pg (ref 26.0–34.0)
MCHC: 34.4 g/dL (ref 32.0–36.0)
MCV: 89.2 fL (ref 80.0–100.0)
Monocytes Absolute: 0.3 10*3/uL (ref 0.2–0.9)
Monocytes Relative: 5 %
Neutro Abs: 4.9 10*3/uL (ref 1.4–6.5)
Neutrophils Relative %: 71 %
Platelets: 150 10*3/uL (ref 150–440)
RBC: 4.5 MIL/uL (ref 3.80–5.20)
RDW: 15 % — ABNORMAL HIGH (ref 11.5–14.5)
WBC: 6.9 10*3/uL (ref 3.6–11.0)

## 2014-12-27 MED ORDER — HEPARIN SOD (PORK) LOCK FLUSH 100 UNIT/ML IV SOLN
500.0000 [IU] | Freq: Once | INTRAVENOUS | Status: AC
  Administered 2014-12-27: 500 [IU] via INTRAVENOUS
  Filled 2014-12-27: qty 5

## 2014-12-27 MED ORDER — SODIUM CHLORIDE 0.9 % IJ SOLN
10.0000 mL | INTRAMUSCULAR | Status: DC | PRN
  Administered 2014-12-27: 10 mL via INTRAVENOUS
  Filled 2014-12-27: qty 10

## 2014-12-27 NOTE — Progress Notes (Signed)
MD aware of potassium of 2.7 

## 2014-12-27 NOTE — Telephone Encounter (Signed)
Critical K+ - 2.7  MD notified. 

## 2014-12-27 NOTE — Progress Notes (Signed)
[place] @ [facility] Telephone:([phone]  Fax:([phone]) [place]: Jul 04, 1940  MR#: [id number]  XKG#:[id number]  Patient Care Team: Rusty Aus, MD as PCP - General (Internal Medicine)  CHIEF COMPLAINT:  Chief Complaint  Patient presents with  . Follow-up   Chief Complaint/Diagnosis:   1.  Left ovarian cancer, adenocarcinoma.pT2 pNO  M0  fIGO STAGING II B. Status post bilateral oopherectomy, omentectomy, lymph node dissection on December 21, 2012. No BRCA mutation. 2.  Started on carboplatinum and Taxol from October, 2014. 3.  Allergic reaction to Taxol on 2nd treatment.  Taxol was discontinued and started on gemcitabine 4.  Patient has finished total 6 cycles of chemotherapy(initially with carboplatinum Taxol followed by carboplatinum and gemcitabine(February, 2015).  No history exists.     INTERVAL HISTORY:   Patient is here for ongoing evaluation regarding ovarian cancer. No abdominal pain.  No nausea.  No vomiting. Appetite has been stable.  REVIEW OF SYSTEMS:   GENERAL:  Feels good.  Active.  No fevers, sweats or weight loss. PERFORMANCE STATUS (ECOG): 01 HEENT:  No visual changes, runny nose, sore throat, mouth sores or tenderness. Lungs: No shortness of breath or cough.  No hemoptysis. Cardiac:  No chest pain, palpitations, orthopnea, or PND. GI:  No nausea, vomiting, diarrhea, constipation, melena or hematochezia. GU:  No urgency, frequency, dysuria, or hematuria. Musculoskeletal:  No back pain.  No joint pain.  No muscle tenderness. Extremities:  No pain or swelling. Skin:  No rashes or skin changes. Neuro:  No headache, numbness or weakness, balance or coordination issues. Endocrine:  No diabetes, thyroid issues, hot flashes or night sweats. Psych:  No mood changes, depression or anxiety. Pain:  No focal pain. Review of systems:  All other systems reviewed and found to be negative. As per HPI. Otherwise, a complete review of  systems is negatve.  PAST MEDICAL HISTORY: Past Medical History  Diagnosis Date  . GERD (gastroesophageal reflux disease)   . Hypothyroidism   . Barrett's esophagus   . History of colon polyps   . Ovarian cancer on left     No BRCA mutation-Left ovarian cancer, adenocarcinoma.pT2 pNO  M0   . History of chemotherapy     finished total 6 cycles of chemotherapy(initially with carboplatinum Taxol followed by carboplatinum and gemcitabine(February, 2015)  . History of mammogram 04/29/2014  . History of colonoscopy with polypectomy 11/07/13  . History of cellulitis   . History of genital warts 1986  . Asthma   . HTN (hypertension)   . IBS (irritable bowel syndrome)   . Chronic acquired lymphedema     since childhood  . Cellulitis     Right lower extremity    PAST SURGICAL HISTORY: Past Surgical History  Procedure Laterality Date  . Exp. laparotomy, tah, salpingo-oophorectomy, appendectomy  12/2012  . Cholecystectomy  1996  . Breast lumpectomy  1962  . Colonoscopy w/ polypectomy  11/07/13  . Esophagogastroduodenoscopy endoscopy  11/07/13  . Hemorrhoid surgery  1963  . Tubal ligation      FAMILY HISTORY Family History  Problem Relation Age of Onset  . Ovarian cancer Mother 74  . Ovarian cancer Maternal Aunt 38  . Breast cancer Maternal Aunt 70  . Lung cancer Maternal Uncle 75  . Prostate cancer Maternal Uncle 65  . Uterine cancer Maternal Aunt 41    ADVANCED DIRECTIVES:  No flowsheet data found.  HEALTH MAINTENANCE: Social History  Substance Use Topics  . Smoking status:  Never Smoker   . Smokeless tobacco: None  . Alcohol Use: None      Allergies  Allergen Reactions  . Statins Other (See Comments)    Muscle cramps  . Taxol [Paclitaxel]     Allergic reaction to Taxol ON 2nd treatment.  Taxol was discontinued and started on gemcitabine   . Sulfa Antibiotics Rash  . Tape Rash    Skin irritation with prolonged use (bandages with latex)    Current Outpatient  Prescriptions  Medication Sig Dispense Refill  . acetaminophen (TYLENOL) 325 MG tablet Take 650 mg by mouth every 6 (six) hours as needed for mild pain.    Marland Kitchen albuterol (ACCUNEB) 0.63 MG/3ML nebulizer solution Take 1 ampule by nebulization every 6 (six) hours as needed for wheezing or shortness of breath.    Marland Kitchen albuterol (PROAIR HFA) 108 (90 BASE) MCG/ACT inhaler Inhale into the lungs.    . Cholecalciferol (VITAMIN D-3 PO) Take 1 tablet by mouth daily.    . Cholecalciferol (VITAMIN D3) 1000 UNITS CAPS Take 1 tablet by mouth daily.    . Fluticasone-Salmeterol (ADVAIR) 100-50 MCG/DOSE AEPB Inhale 1 puff into the lungs 2 (two) times daily.    Marland Kitchen ibuprofen (ADVIL,MOTRIN) 800 MG tablet Take 800 mg by mouth every 8 (eight) hours as needed for mild pain.    Marland Kitchen levothyroxine (SYNTHROID, LEVOTHROID) 50 MCG tablet Take 50 mcg by mouth daily before breakfast.    . mometasone (NASONEX) 50 MCG/ACT nasal spray Place 2 sprays into the nose daily.    . Multiple Vitamins-Minerals (CENTRUM SILVER ADULT 50+ PO) Take 1 tablet by mouth daily.    . potassium chloride (KLOR-CON 10) 10 MEQ tablet Take 3 tablets by mouth daily.    . Probiotic Product (PROBIOTIC FORMULA PO) Take 1 capsule by mouth daily.    . sertraline (ZOLOFT) 100 MG tablet Take 1 tablet by mouth daily.    Marland Kitchen torsemide (DEMADEX) 20 MG tablet Take 20 mg by mouth daily.    Marland Kitchen triamterene-hydrochlorothiazide (MAXZIDE-25) 37.5-25 MG per tablet Take 1 tablet by mouth daily.     No current facility-administered medications for this visit.   Facility-Administered Medications Ordered in Other Visits  Medication Dose Route Frequency Provider Last Rate Last Dose  . sodium chloride 0.9 % injection 10 mL  10 mL Intravenous PRN Forest Gleason, MD   10 mL at 11/21/14 1108    OBJECTIVE:  Filed Vitals:   12/27/14 1104  BP: 142/79  Pulse: 75  Temp: 96.9 F (36.1 C)     Body mass index is 33.4 kg/(m^2).    ECOG FS:1 - Symptomatic but completely  ambulatory  PHYSICAL EXAM: GENERAL:  Well developed, well nourished, sitting comfortably in the exam room in no acute distress. MENTAL STATUS:  Alert and oriented to person, place and time.  ENT:  Oropharynx clear without lesion.  Tongue normal. Mucous membranes moist.  RESPIRATORY:  Clear to auscultation without rales, wheezes or rhonchi. CARDIOVASCULAR:  Regular rate and rhythm without murmur, rub or gallop. BREAST:  Right breast without masses, skin changes or nipple discharge.  Left breast without masses, skin changes or nipple discharge. ABDOMEN:  Soft, non-tender, with active bowel sounds, and no hepatosplenomegaly.  No masses. BACK:  No CVA tenderness.  No tenderness on percussion of the back or rib cage. SKIN:  No rashes, ulcers or lesions. EXTREMITIES: No edema, no skin discoloration or tenderness.  No palpable cords. LYMPH NODES: No palpable cervical, supraclavicular, axillary or inguinal adenopathy  NEUROLOGICAL: Unremarkable.  PSYCH:  Appropriate.   LAB RESULTS:  CBC Latest Ref Rng 12/27/2014 05/24/2014  WBC 3.6 - 11.0 K/uL 6.9 8.6  Hemoglobin 12.0 - 16.0 g/dL 13.8 14.2  Hematocrit 35.0 - 47.0 % 40.1 41.4  Platelets 150 - 440 K/uL 150 153    Infusion on 12/27/2014  Component Date Value Ref Range Status  . WBC 12/27/2014 6.9  3.6 - 11.0 K/uL Final   A-LINE DRAW  . RBC 12/27/2014 4.50  3.80 - 5.20 MIL/uL Final  . Hemoglobin 12/27/2014 13.8  12.0 - 16.0 g/dL Final  . HCT 12/27/2014 40.1  35.0 - 47.0 % Final  . MCV 12/27/2014 89.2  80.0 - 100.0 fL Final  . MCH 12/27/2014 30.7  26.0 - 34.0 pg Final  . MCHC 12/27/2014 34.4  32.0 - 36.0 g/dL Final  . RDW 12/27/2014 15.0* 11.5 - 14.5 % Final  . Platelets 12/27/2014 150  150 - 440 K/uL Final  . Neutrophils Relative % 12/27/2014 71   Final  . Neutro Abs 12/27/2014 4.9  1.4 - 6.5 K/uL Final  . Lymphocytes Relative 12/27/2014 21   Final  . Lymphs Abs 12/27/2014 1.4  1.0 - 3.6 K/uL Final  . Monocytes Relative 12/27/2014 5    Final  . Monocytes Absolute 12/27/2014 0.3  0.2 - 0.9 K/uL Final  . Eosinophils Relative 12/27/2014 3   Final  . Eosinophils Absolute 12/27/2014 0.2  0 - 0.7 K/uL Final  . Basophils Relative 12/27/2014 0   Final  . Basophils Absolute 12/27/2014 0.0  0 - 0.1 K/uL Final  . Sodium 12/27/2014 135  135 - 145 mmol/L Final  . Potassium 12/27/2014 2.7* 3.5 - 5.1 mmol/L Final   Comment: RESULTS VERIFIED BY REPEAT TESTING CRITICAL RESULT CALLED TO, READ BACK BY AND VERIFIED WITH nyo to anita black 12/27/14 1027   . Chloride 12/27/2014 98* 101 - 111 mmol/L Final  . CO2 12/27/2014 28  22 - 32 mmol/L Final  . Glucose, Bld 12/27/2014 212* 65 - 99 mg/dL Final  . BUN 12/27/2014 18  6 - 20 mg/dL Final  . Creatinine, Ser 12/27/2014 0.87  0.44 - 1.00 mg/dL Final  . Calcium 12/27/2014 9.0  8.9 - 10.3 mg/dL Final  . GFR calc non Af Amer 12/27/2014 >60  >60 mL/min Final  . GFR calc Af Amer 12/27/2014 >60  >60 mL/min Final   Comment: (NOTE) The eGFR has been calculated using the CKD EPI equation. This calculation has not been validated in all clinical situations. eGFR's persistently <60 mL/min signify possible Chronic Kidney Disease.   . Anion gap 12/27/2014 9  5 - 15 Final    CA 125 0.0 - 38.1 U/mL 15.6         ASSESSMENT: Carcinoma of ovary stage II disease status post debulking surgery followed by chemotherapy CA 125 is within normal limit No evidence of recurrent disease on clinical examination 2.  Hypokalemia potassium is 2.7. Patient was advised to increase oral potassium supplements and recheck potassium in next few weeks Increase intake by orange juice and banana  She is on torsemide.  If needed by her to be discontinued MEDICAL DECISION MAKING:  1. In oct/nov, bilateral screening mammogram 2. See md in 6 months with cbc, met c,  S&S was advised to get potassium checked with primary care physician  Patient expressed understanding and was in agreement with this plan. She also  understands that She can call clinic at any time with any questions, concerns, or complaints.  No matching staging information was found for the patient.  Forest Gleason, MD   12/27/2014 11:41 AM

## 2014-12-27 NOTE — Progress Notes (Signed)
Patient does not have living will.  Never smoked. 

## 2014-12-28 LAB — CA 125: CA 125: 15.6 U/mL (ref 0.0–38.1)

## 2015-01-01 ENCOUNTER — Encounter: Payer: Self-pay | Admitting: Oncology

## 2015-02-07 ENCOUNTER — Inpatient Hospital Stay: Payer: Medicare Other | Attending: Oncology

## 2015-02-07 DIAGNOSIS — C801 Malignant (primary) neoplasm, unspecified: Secondary | ICD-10-CM

## 2015-02-07 DIAGNOSIS — Z8543 Personal history of malignant neoplasm of ovary: Secondary | ICD-10-CM | POA: Diagnosis not present

## 2015-02-07 DIAGNOSIS — Z452 Encounter for adjustment and management of vascular access device: Secondary | ICD-10-CM | POA: Diagnosis not present

## 2015-02-07 MED ORDER — SODIUM CHLORIDE 0.9 % IJ SOLN
10.0000 mL | INTRAMUSCULAR | Status: DC | PRN
  Administered 2015-02-07: 10 mL via INTRAVENOUS
  Filled 2015-02-07: qty 10

## 2015-02-07 MED ORDER — HEPARIN SOD (PORK) LOCK FLUSH 100 UNIT/ML IV SOLN
500.0000 [IU] | Freq: Once | INTRAVENOUS | Status: AC
  Administered 2015-02-07: 500 [IU] via INTRAVENOUS
  Filled 2015-02-07: qty 5

## 2015-02-21 DIAGNOSIS — E559 Vitamin D deficiency, unspecified: Secondary | ICD-10-CM | POA: Insufficient documentation

## 2015-03-21 ENCOUNTER — Inpatient Hospital Stay: Payer: Medicare Other | Attending: Oncology

## 2015-03-21 DIAGNOSIS — Z8543 Personal history of malignant neoplasm of ovary: Secondary | ICD-10-CM | POA: Insufficient documentation

## 2015-03-21 DIAGNOSIS — Z452 Encounter for adjustment and management of vascular access device: Secondary | ICD-10-CM | POA: Insufficient documentation

## 2015-03-21 DIAGNOSIS — C801 Malignant (primary) neoplasm, unspecified: Secondary | ICD-10-CM

## 2015-03-21 MED ORDER — SODIUM CHLORIDE 0.9 % IJ SOLN
10.0000 mL | INTRAMUSCULAR | Status: AC | PRN
  Administered 2015-03-21: 10 mL via INTRAVENOUS
  Filled 2015-03-21: qty 10

## 2015-03-21 MED ORDER — HEPARIN SOD (PORK) LOCK FLUSH 100 UNIT/ML IV SOLN: 500.0000 [IU] | Freq: Once | INTRAVENOUS | Status: AC

## 2015-03-21 MED ORDER — HEPARIN SOD (PORK) LOCK FLUSH 100 UNIT/ML IV SOLN
INTRAVENOUS | Status: AC
  Filled 2015-03-21: qty 5

## 2015-03-25 ENCOUNTER — Emergency Department
Admission: EM | Admit: 2015-03-25 | Discharge: 2015-03-25 | Disposition: A | Discharge status: Home / Self Care | Payer: Medicare Other | Attending: Emergency Medicine | Admitting: Emergency Medicine

## 2015-03-25 ENCOUNTER — Encounter: Payer: Self-pay | Admitting: Emergency Medicine

## 2015-03-25 DIAGNOSIS — M10071 Idiopathic gout, right ankle and foot: Secondary | ICD-10-CM | POA: Insufficient documentation

## 2015-03-25 DIAGNOSIS — I10 Essential (primary) hypertension: Secondary | ICD-10-CM | POA: Diagnosis not present

## 2015-03-25 DIAGNOSIS — Z79899 Other long term (current) drug therapy: Secondary | ICD-10-CM | POA: Diagnosis not present

## 2015-03-25 DIAGNOSIS — Z7951 Long term (current) use of inhaled steroids: Secondary | ICD-10-CM | POA: Insufficient documentation

## 2015-03-25 DIAGNOSIS — M79671 Pain in right foot: Secondary | ICD-10-CM | POA: Diagnosis present

## 2015-03-25 MED ORDER — INDOMETHACIN 50 MG PO CAPS: 50.0000 mg | ORAL_CAPSULE | Freq: Three times a day (TID) | ORAL | Status: DC

## 2015-03-25 NOTE — ED Notes (Signed)
States she developed right foot and great toe pain since yesterday  Denies any injury

## 2015-03-25 NOTE — Discharge Instructions (Signed)

## 2015-03-25 NOTE — ED Provider Notes (Signed)
[facility] Emergency Department Provider Note  ____________________________________________  Time seen: Approximately 10:50 AM  I have reviewed the triage vital signs and the nursing notes.   HISTORY  Chief Complaint Foot Pain    HPI Leah Santos is a 74 y.o. female who presents to the emergency department complaining of right big toe pain. She states that symptoms began rapidly yesterday. She states the pain is sharp in nature. Pain is on the proximal phalanx and MTP joint. Patient endorses some redness and warmth to area. She denies any other pain. She denies any history of injury to toe. She has taken Aleve this morning with moderate relief.Pain is moderate to severe, constant, worse with ambulation or movement.   Past Medical History  Diagnosis Date  . GERD (gastroesophageal reflux disease)   . Hypothyroidism   . Barrett's esophagus   . History of colon polyps   . Ovarian cancer on left (HCC)     No BRCA mutation-Left ovarian cancer, adenocarcinoma.pT2 pNO  M0   . History of chemotherapy     finished total 6 cycles of chemotherapy(initially with carboplatinum Taxol followed by carboplatinum and gemcitabine(February, 2015)  . History of mammogram 04/29/2014  . History of colonoscopy with polypectomy 11/07/13  . History of cellulitis   . History of genital warts 1986  . Asthma   . HTN (hypertension)   . IBS (irritable bowel syndrome)   . Chronic acquired lymphedema     since childhood  . Cellulitis     Right lower extremity    Patient Active Problem List   Diagnosis Date Noted  . Acid reflux 12/27/2014  . History of colon polyps 12/27/2014  . HLD (hyperlipidemia) 12/27/2014  . BP (high blood pressure) 12/27/2014  . Obstructive apnea 12/27/2014  . H/O ovarian cancer 02/16/2014  . Acquired hypothyroidism 02/16/2014    Past Surgical History  Procedure Laterality Date  . Exp. laparotomy, tah, salpingo-oophorectomy, appendectomy  12/2012   . Cholecystectomy  1996  . Breast lumpectomy  1962  . Colonoscopy w/ polypectomy  11/07/13  . Esophagogastroduodenoscopy endoscopy  11/07/13  . Hemorrhoid surgery  1963  . Tubal ligation      Current Outpatient Rx  Name  Route  Sig  Dispense  Refill  . acetaminophen (TYLENOL) 325 MG tablet   Oral   Take 650 mg by mouth every 6 (six) hours as needed for mild pain.         Marland Kitchen albuterol (ACCUNEB) 0.63 MG/3ML nebulizer solution   Nebulization   Take 1 ampule by nebulization every 6 (six) hours as needed for wheezing or shortness of breath.         Marland Kitchen albuterol (PROAIR HFA) 108 (90 BASE) MCG/ACT inhaler   Inhalation   Inhale into the lungs.         . Cholecalciferol (VITAMIN D-3 PO)   Oral   Take 1 tablet by mouth daily.         . Cholecalciferol (VITAMIN D3) 1000 UNITS CAPS   Oral   Take 1 tablet by mouth daily.         . Fluticasone-Salmeterol (ADVAIR) 100-50 MCG/DOSE AEPB   Inhalation   Inhale 1 puff into the lungs 2 (two) times daily.         Marland Kitchen ibuprofen (ADVIL,MOTRIN) 800 MG tablet   Oral   Take 800 mg by mouth every 8 (eight) hours as needed for mild pain.         . indomethacin (INDOCIN)  50 MG capsule   Oral   Take 1 capsule (50 mg total) by mouth 3 (three) times daily with meals.   30 capsule   1   . levothyroxine (SYNTHROID, LEVOTHROID) 50 MCG tablet   Oral   Take 50 mcg by mouth daily before breakfast.         . mometasone (NASONEX) 50 MCG/ACT nasal spray   Nasal   Place 2 sprays into the nose daily.         . Multiple Vitamins-Minerals (CENTRUM SILVER ADULT 50+ PO)   Oral   Take 1 tablet by mouth daily.         . potassium chloride (KLOR-CON 10) 10 MEQ tablet   Oral   Take 3 tablets by mouth daily.         . Probiotic Product (PROBIOTIC FORMULA PO)   Oral   Take 1 capsule by mouth daily.         . sertraline (ZOLOFT) 100 MG tablet   Oral   Take 1 tablet by mouth daily.         Marland Kitchen torsemide (DEMADEX) 20 MG tablet    Oral   Take 20 mg by mouth daily.         Marland Kitchen triamterene-hydrochlorothiazide (MAXZIDE-25) 37.5-25 MG per tablet   Oral   Take 1 tablet by mouth daily.           Allergies Statins; Taxol; Sulfa antibiotics; and Tape  Family History  Problem Relation Age of Onset  . Ovarian cancer Mother 57  . Ovarian cancer Maternal Aunt 38  . Breast cancer Maternal Aunt 70  . Lung cancer Maternal Uncle 75  . Prostate cancer Maternal Uncle 65  . Uterine cancer Maternal Aunt 41    Social History Social History  Substance Use Topics  . Smoking status: Never Smoker   . Smokeless tobacco: None  . Alcohol Use: None    Review of Systems Constitutional: No fever/chills Eyes: No visual changes. ENT: No sore throat. Cardiovascular: Denies chest pain. Respiratory: Denies shortness of breath. Gastrointestinal: No abdominal pain.  No nausea, no vomiting.  No diarrhea.  No constipation. Genitourinary: Negative for dysuria. Musculoskeletal: Negative for back pain. Endorses great toe pain right foot. Skin: Negative for rash. Neurological: Negative for headaches, focal weakness or numbness.  10-point ROS otherwise negative.  ____________________________________________   PHYSICAL EXAM:  VITAL SIGNS: ED Triage Vitals  Enc Vitals Group     BP 03/25/15 1022 130/59 mmHg     Pulse Rate 03/25/15 1020 77     Resp 03/25/15 1020 20     Temp 03/25/15 1020 98.4 F (36.9 C)     Temp Source 03/25/15 1020 Oral     SpO2 03/25/15 1020 98 %     Weight 03/25/15 1020 185 lb (83.915 kg)     Height 03/25/15 1020 $RemoveBefor'5\' 3"'[id number]$  (1.6 m)     Head Cir --      Peak Flow --      Pain Score 03/25/15 1012 8     Pain Loc --      Pain Edu? --      Excl. in [place]? --     Constitutional: Alert and oriented. Well appearing and in no acute distress. Eyes: Conjunctivae are normal. PERRL. EOMI. Head: Atraumatic. Nose: No congestion/rhinnorhea. Mouth/Throat: Mucous membranes are moist.  Oropharynx non-erythematous. Neck:  No stridor.   Cardiovascular: Normal rate, regular rhythm. Grossly normal heart sounds.  Good peripheral circulation. Respiratory: Normal respiratory  effort.  No retractions. Lungs CTAB. Gastrointestinal: Soft and nontender. No distention. No abdominal bruits. No CVA tenderness. Musculoskeletal: No visible abnormality to right great toe when compared with left. Patient does have range of motion in the toe. Patient is tender to palpation over the MTP joint. There is warmth to palpation.  No joint effusions. Neurologic:  Normal speech and language. No gross focal neurologic deficits are appreciated. No gait instability. Skin:  Skin is warm, dry and intact. No rash noted. Psychiatric: Mood and affect are normal. Speech and behavior are normal.  ____________________________________________   LABS (all labs ordered are listed, but only abnormal results are displayed)  Labs Reviewed - No data to display ____________________________________________  EKG   ____________________________________________  RADIOLOGY   ____________________________________________   PROCEDURES  Procedure(s) performed: None  Critical Care performed: No  ____________________________________________   INITIAL IMPRESSION / ASSESSMENT AND PLAN / ED COURSE  Pertinent labs & imaging results that were available during my care of the patient were reviewed by me and considered in my medical decision making (see chart for details).  Patient's history, symptoms, physical exam are taken and the consideration for diagnosis. I advised patient of findings and diagnosis and he verbalizes understanding of same. I explained the treatment plan to the patient and the patient verbalizes understanding and compliance with same. Patient is to follow-up with primary care provider or specialist provided on paperwork for further evaluation and treatment should symptoms persist past treatment course. All of the patient's questions are  answered. ____________________________________________   FINAL CLINICAL IMPRESSION(S) / ED DIAGNOSES  Final diagnoses:  Acute idiopathic gout of right foot      Darletta Moll, PA-C 03/25/15 1106  Ahmed Prima, MD 03/25/15 [phone]

## 2015-04-19 ENCOUNTER — Telehealth: Payer: Self-pay | Admitting: *Deleted

## 2015-04-19 NOTE — Telephone Encounter (Signed)
In attempt to lighten md schedule, contacted patient to see if she would be willing to come on 04/25/15 instead of 05/02/15. She could see Dr. Fransisca Connors. RN left voice mail with this information.

## 2015-05-02 ENCOUNTER — Encounter: Payer: Self-pay | Admitting: Obstetrics and Gynecology

## 2015-05-02 ENCOUNTER — Inpatient Hospital Stay: Payer: Medicare Other

## 2015-05-02 ENCOUNTER — Inpatient Hospital Stay: Payer: Medicare Other | Attending: Obstetrics and Gynecology | Admitting: Obstetrics and Gynecology

## 2015-05-02 VITALS — BP 170/76 | HR 69 | Temp 98.5°F | Ht 63.0 in | Wt 186.2 lb

## 2015-05-02 DIAGNOSIS — E876 Hypokalemia: Secondary | ICD-10-CM | POA: Diagnosis not present

## 2015-05-02 DIAGNOSIS — Z90710 Acquired absence of both cervix and uterus: Secondary | ICD-10-CM | POA: Insufficient documentation

## 2015-05-02 DIAGNOSIS — Z8543 Personal history of malignant neoplasm of ovary: Secondary | ICD-10-CM

## 2015-05-02 DIAGNOSIS — C569 Malignant neoplasm of unspecified ovary: Secondary | ICD-10-CM

## 2015-05-02 DIAGNOSIS — K219 Gastro-esophageal reflux disease without esophagitis: Secondary | ICD-10-CM | POA: Diagnosis not present

## 2015-05-02 DIAGNOSIS — Z9221 Personal history of antineoplastic chemotherapy: Secondary | ICD-10-CM | POA: Insufficient documentation

## 2015-05-02 DIAGNOSIS — Z09 Encounter for follow-up examination after completed treatment for conditions other than malignant neoplasm: Secondary | ICD-10-CM | POA: Insufficient documentation

## 2015-05-02 DIAGNOSIS — E039 Hypothyroidism, unspecified: Secondary | ICD-10-CM | POA: Insufficient documentation

## 2015-05-02 MED ORDER — SODIUM CHLORIDE 0.9 % IJ SOLN
10.0000 mL | INTRAMUSCULAR | Status: DC | PRN
  Administered 2015-05-02: 10 mL via INTRAVENOUS
  Filled 2015-05-02: qty 10

## 2015-05-02 MED ORDER — HEPARIN SOD (PORK) LOCK FLUSH 100 UNIT/ML IV SOLN
500.0000 [IU] | Freq: Once | INTRAVENOUS | Status: AC
  Administered 2015-05-02: 500 [IU] via INTRAVENOUS
  Filled 2015-05-02: qty 5

## 2015-05-02 NOTE — Patient Instructions (Signed)
Exercising to Lose Weight Exercising can help you to lose weight. In order to lose weight through exercise, you need to do vigorous-intensity exercise. You can tell that you are exercising with vigorous intensity if you are breathing very hard and fast and cannot hold a conversation while exercising. Moderate-intensity exercise helps to maintain your current weight. You can tell that you are exercising at a moderate level if you have a higher heart rate and faster breathing, but you are still able to hold a conversation. HOW OFTEN SHOULD I EXERCISE? Choose an activity that you enjoy and set realistic goals. Your health care provider can help you to make an activity plan that works for you. Exercise regularly as directed by your health care provider. This may include:  Doing resistance training twice each week, such as:  Push-ups.  Sit-ups.  Lifting weights.  Using resistance bands.  Doing a given intensity of exercise for a given amount of time. Choose from these options:  150 minutes of moderate-intensity exercise every week.  75 minutes of vigorous-intensity exercise every week.  A mix of moderate-intensity and vigorous-intensity exercise every week. Children, pregnant women, people who are out of shape, people who are overweight, and older adults may need to consult a health care provider for individual recommendations. If you have any sort of medical condition, be sure to consult your health care provider before starting a new exercise program. WHAT ARE SOME ACTIVITIES THAT CAN HELP ME TO LOSE WEIGHT?   Walking at a rate of at least 4.5 miles an hour.  Jogging or running at a rate of 5 miles per hour.  Biking at a rate of at least 10 miles per hour.  Lap swimming.  Roller-skating or in-line skating.  Cross-country skiing.  Vigorous competitive sports, such as football, basketball, and soccer.  Jumping rope.  Aerobic dancing. HOW CAN I BE MORE ACTIVE IN MY DAY-TO-DAY  ACTIVITIES?  Use the stairs instead of the elevator.  Take a walk during your lunch break.  If you drive, park your car farther away from work or school.  If you take public transportation, get off one stop early and walk the rest of the way.  Make all of your phone calls while standing up and walking around.  Get up, stretch, and walk around every 30 minutes throughout the day. WHAT GUIDELINES SHOULD I FOLLOW WHILE EXERCISING?  Do not exercise so much that you hurt yourself, feel dizzy, or get very short of breath.  Consult your health care provider prior to starting a new exercise program.  Wear comfortable clothes and shoes with good support.  Drink plenty of water while you exercise to prevent dehydration or heat stroke. Body water is lost during exercise and must be replaced.  Work out until you breathe faster and your heart beats faster.   This information is not intended to replace advice given to you by your health care provider. Make sure you discuss any questions you have with your health care provider.   Document Released: 05/10/2010 Document Revised: 04/28/2014 Document Reviewed: 09/08/2013 Elsevier Interactive Patient Education 2016 [employer] Calorie Counting for Weight Loss Calories are energy you get from the things you eat and drink. Your body uses this energy to keep you going throughout the day. The number of calories you eat affects your weight. When you eat more calories than your body needs, your body stores the extra calories as fat. When you eat fewer calories than your body needs, your body burns   fat to get the energy it needs. Calorie counting means keeping track of how many calories you eat and drink each day. If you make sure to eat fewer calories than your body needs, you should lose weight. In order for calorie counting to work, you will need to eat the number of calories that are right for you in a day to lose a healthy amount of weight per week. A  healthy amount of weight to lose per week is usually 1-2 lb (0.5-0.9 kg). A dietitian can determine how many calories you need in a day and give you suggestions on how to reach your calorie goal.  WHAT IS MY MY PLAN? My goal is to have __________ calories per day.  If I have this many calories per day, I should lose around __________ pounds per week. WHAT DO I NEED TO KNOW ABOUT CALORIE COUNTING? In order to meet your daily calorie goal, you will need to:  Find out how many calories are in each food you would like to eat. Try to do this before you eat.  Decide how much of the food you can eat.  Write down what you ate and how many calories it had. Doing this is called keeping a food log. WHERE DO I FIND CALORIE INFORMATION? The number of calories in a food can be found on a Nutrition Facts label. Note that all the information on a label is based on a specific serving of the food. If a food does not have a Nutrition Facts label, try to look up the calories online or ask your dietitian for help. HOW DO I DECIDE HOW MUCH TO EAT? To decide how much of the food you can eat, you will need to consider both the number of calories in one serving and the size of one serving. This information can be found on the Nutrition Facts label. If a food does not have a Nutrition Facts label, look up the information online or ask your dietitian for help. Remember that calories are listed per serving. If you choose to have more than one serving of a food, you will have to multiply the calories per serving by the amount of servings you plan to eat. For example, the label on a package of bread might say that a serving size is 1 slice and that there are 90 calories in a serving. If you eat 1 slice, you will have eaten 90 calories. If you eat 2 slices, you will have eaten 180 calories. HOW DO I KEEP A FOOD LOG? After each meal, record the following information in your food log:  What you ate.  How much of it you  ate.  How many calories it had.  Then, add up your calories. Keep your food log near you, such as in a small notebook in your pocket. Another option is to use a mobile app or website. Some programs will calculate calories for you and show you how many calories you have left each time you add an item to the log. WHAT ARE SOME CALORIE COUNTING TIPS?  Use your calories on foods and drinks that will fill you up and not leave you hungry. Some examples of this include foods like nuts and nut butters, vegetables, lean proteins, and high-fiber foods (more than 5 g fiber per serving).  Eat nutritious foods and avoid empty calories. Empty calories are calories you get from foods or beverages that do not have many nutrients, such as candy and soda. It   is better to have a nutritious high-calorie food (such as an avocado) than a food with few nutrients (such as a bag of chips).  Know how many calories are in the foods you eat most often. This way, you do not have to look up how many calories they have each time you eat them.  Look out for foods that may seem like low-calorie foods but are really high-calorie foods, such as baked goods, soda, and fat-free candy.  Pay attention to calories in drinks. Drinks such as sodas, specialty coffee drinks, alcohol, and juices have a lot of calories yet do not fill you up. Choose low-calorie drinks like water and diet drinks.  Focus your calorie counting efforts on higher calorie items. Logging the calories in a garden salad that contains only vegetables is less important than calculating the calories in a milk shake.  Find a way of tracking calories that works for you. Get creative. Most people who are successful find ways to keep track of how much they eat in a day, even if they do not count every calorie. WHAT ARE SOME PORTION CONTROL TIPS?  Know how many calories are in a serving. This will help you know how many servings of a certain food you can have.  Use a  measuring cup to measure serving sizes. This is helpful when you start out. With time, you will be able to estimate serving sizes for some foods.  Take some time to put servings of different foods on your favorite plates, bowls, and cups so you know what a serving looks like.  Try not to eat straight from a bag or box. Doing this can lead to overeating. Put the amount you would like to eat in a cup or on a plate to make sure you are eating the right portion.  Use smaller plates, glasses, and bowls to prevent overeating. This is a quick and easy way to practice portion control. If your plate is smaller, less food can fit on it.  Try not to multitask while eating, such as watching TV or using your computer. If it is time to eat, sit down at a table and enjoy your food. Doing this will help you to start recognizing when you are full. It will also make you more aware of what and how much you are eating. HOW CAN I CALORIE COUNT WHEN EATING OUT?  Ask for smaller portion sizes or child-sized portions.  Consider sharing an entree and sides instead of getting your own entree.  If you get your own entree, eat only half. Ask for a box at the beginning of your meal and put the rest of your entree in it so you are not tempted to eat it.  Look for the calories on the menu. If calories are listed, choose the lower calorie options.  Choose dishes that include vegetables, fruits, whole grains, low-fat dairy products, and lean protein. Focusing on smart food choices from each of the 5 food groups can help you stay on track at restaurants.  Choose items that are boiled, broiled, grilled, or steamed.  Choose water, milk, unsweetened iced tea, or other drinks without added sugars. If you want an alcoholic beverage, choose a lower calorie option. For example, a regular margarita can have up to 700 calories and a glass of wine has around 150.  Stay away from items that are buttered, battered, fried, or served with  cream sauce. Items labeled "crispy" are usually fried, unless stated otherwise.    Ask for dressings, sauces, and syrups on the side. These are usually very high in calories, so do not eat much of them.  Watch out for salads. Many people think salads are a healthy option, but this is often not the case. Many salads come with bacon, fried chicken, lots of cheese, fried chips, and dressing. All of these items have a lot of calories. If you want a salad, choose a garden salad and ask for grilled meats or steak. Ask for the dressing on the side, or ask for olive oil and vinegar or lemon to use as dressing.  Estimate how many servings of a food you are given. For example, a serving of cooked rice is  cup or about the size of half a tennis ball or one cupcake wrapper. Knowing serving sizes will help you be aware of how much food you are eating at restaurants. The list below tells you how big or small some common portion sizes are based on everyday objects.  1 oz--4 stacked dice.  3 oz--1 deck of cards.  1 tsp--1 dice.  1 Tbsp-- a Ping-Pong ball.  2 Tbsp--1 Ping-Pong ball.   cup--1 tennis ball or 1 cupcake wrapper.  1 cup--1 baseball.   This information is not intended to replace advice given to you by your health care provider. Make sure you discuss any questions you have with your health care provider.   Document Released: 04/07/2005 Document Revised: 04/28/2014 Document Reviewed: 02/10/2013 Elsevier Interactive Patient Education 2016 [employer]  

## 2015-05-02 NOTE — Progress Notes (Signed)
Gynecologic Oncology Interval Visit   Primary Care Provider: Rusty Aus, MD [facility] [place], [place] [address] [phone]  Referring MD: Dr. Ferne Reus (no longer here)  Chief Concern: Ovarian cancer surveillance  Subjective:  Leah Santos is a 75 y.o. female who is seen for ovarian cancer surveillance.   She was seen by Dr. Oliva Bustard on 12/27/2014 with a negative exam. She did have hypokalemia and he advised her regarding supplementation and follow up with PCP. She has no complaints today.   Lab Results  Component Value Date   CA125 15.6 12/27/2014   Mammogram: 05/09/2014 BI-RADS CATEGORY  1: Negative. Her mammogram is scheduled for 05/11/2015  Last breast exam performed by Dr. Oliva Bustard on 12/27/2014: negative based on documentation    Gynecologic Oncologym History Leah Santos was diagnosed with left ovarian cancer, adenocarcinoma.pT2 pNO  M0, FIGOSTAGING II B. Status post bilateral oopherectomy, omentectomy, lymph node dissection on December 21, 2012.  -Started on carboplatinum and Taxol from October, 2014. -Allergic reaction to Taxol on 2nd treatment.  Taxol was discontinued and started on gemcitabine -Patient has finished total 6 cycles of chemotherapy(initially with carboplatinum Taxol followed by carboplatinum and gemcitabine(February, 2015).  Pelvic MRI 11/21/2013  FINDINGS: Previous hysterectomy noted. Simple appearing cyst in the right adnexa shows significant decrease in size since previous study, currently measuring 1.2 x 2.5 cm on image 23 of series 9 compared to 4.5 x 5.9 cm previously. This is consistent with a resolving postoperative fluid collection such as a lymphocele or seroma. Another simple appearing cyst in the left adnexa measures 3.0 x 4.4 cm. This remains stable in size and appearance since prior exam, and is also suspicious for a postoperative fluid collection. No internal septations or solid  mural nodules are visualized.  IMPRESSION: No acute findings or radiographic signs of pelvic metastatic disease.  Near complete resolution of right adnexal fluid collection, consistent with resolving postoperative lymphocele or seroma.  Stable simple appearing left adnexal cyst, most consistent with persistent postoperative lymphocele or seroma.   Genetic testing: No BRCA mutation.  Health care maintenance: Dr. Oliva Bustard ordered her mammograms and performed cinical breast exams last past year .   Problem List: Patient Active Problem List   Diagnosis Date Noted  . Acid reflux 12/27/2014  . History of colon polyps 12/27/2014  . HLD (hyperlipidemia) 12/27/2014  . BP (high blood pressure) 12/27/2014  . Obstructive apnea 12/27/2014  . H/O ovarian cancer 02/16/2014  . Acquired hypothyroidism 02/16/2014    Past Medical History: Past Medical History  Diagnosis Date  . GERD (gastroesophageal reflux disease)   . Hypothyroidism   . Barrett's esophagus   . History of colon polyps   . Ovarian cancer on left (HCC)     No BRCA mutation-Left ovarian cancer, adenocarcinoma.pT2 pNO  M0   . History of chemotherapy     finished total 6 cycles of chemotherapy(initially with carboplatinum Taxol followed by carboplatinum and gemcitabine(February, 2015)  . History of mammogram 04/29/2014  . History of colonoscopy with polypectomy 11/07/13  . History of cellulitis   . History of genital warts 1986  . Asthma   . HTN (hypertension)   . IBS (irritable bowel syndrome)   . Chronic acquired lymphedema     since childhood  . Cellulitis     Right lower extremity    Past Surgical History: Past Surgical History  Procedure Laterality Date  . Exp. laparotomy, tah, salpingo-oophorectomy, appendectomy  12/2012  . Cholecystectomy  1996  .  Breast lumpectomy  1962  . Colonoscopy w/ polypectomy  11/07/13  . Esophagogastroduodenoscopy endoscopy  11/07/13  . Hemorrhoid surgery  1963  . Tubal ligation       Past Gynecologic History:  As per HPI  OB History:  OB History  Gravida Para Term Preterm AB SAB TAB Ectopic Multiple Living  '3 3 3           '$ # Outcome Date GA Lbr Len/2nd Weight Sex Delivery Anes PTL Lv  3 Term           2 Term           1 Term             Obstetric Comments  Onset of menses at age 36    Family History: Family History  Problem Relation Age of Onset  . Ovarian cancer Mother 49  . Ovarian cancer Maternal Aunt 38  . Breast cancer Maternal Aunt 70  . Lung cancer Maternal Uncle 75  . Prostate cancer Maternal Uncle 65  . Uterine cancer Maternal Aunt 41    Social History: Social History   Social History  . Marital Status: Married    Spouse Name: N/A  . Number of Children: N/A  . Years of Education: N/A   Occupational History  . Not on file.   Social History Main Topics  . Smoking status: Never Smoker   . Smokeless tobacco: Not on file  . Alcohol Use: Not on file  . Drug Use: No  . Sexual Activity: Not on file   Other Topics Concern  . Not on file   Social History Narrative    Allergies: Allergies  Allergen Reactions  . Statins Other (See Comments)    Muscle cramps  . Taxol [Paclitaxel]     Allergic reaction to Taxol ON 2nd treatment.  Taxol was discontinued and started on gemcitabine   . Sulfa Antibiotics Rash  . Tape Rash    Skin irritation with prolonged use (bandages with latex)    Current Medications: Current Outpatient Prescriptions  Medication Sig Dispense Refill  . acetaminophen (TYLENOL) 325 MG tablet Take 650 mg by mouth every 6 (six) hours as needed for mild pain.    Marland Kitchen albuterol (ACCUNEB) 0.63 MG/3ML nebulizer solution Take 1 ampule by nebulization every 6 (six) hours as needed for wheezing or shortness of breath.    Marland Kitchen albuterol (PROAIR HFA) 108 (90 BASE) MCG/ACT inhaler Inhale into the lungs.    . Cholecalciferol (VITAMIN D-3 PO) Take 1 tablet by mouth daily.    . Cholecalciferol (VITAMIN D3) 1000 UNITS CAPS Take 1  tablet by mouth daily.    Marland Kitchen ibuprofen (ADVIL,MOTRIN) 800 MG tablet Take 800 mg by mouth every 8 (eight) hours as needed for mild pain.    Marland Kitchen levothyroxine (SYNTHROID, LEVOTHROID) 50 MCG tablet Take 50 mcg by mouth daily before breakfast.    . mometasone (NASONEX) 50 MCG/ACT nasal spray Place 2 sprays into the nose daily.    . Multiple Vitamins-Minerals (CENTRUM SILVER ADULT 50+ PO) Take 1 tablet by mouth daily.    . potassium chloride (KLOR-CON 10) 10 MEQ tablet Take 3 tablets by mouth daily.    . Probiotic Product (PROBIOTIC FORMULA PO) Take 1 capsule by mouth daily.    . sertraline (ZOLOFT) 100 MG tablet Take 1 tablet by mouth daily.    Marland Kitchen torsemide (DEMADEX) 20 MG tablet Take 20 mg by mouth daily.    Marland Kitchen triamterene-hydrochlorothiazide (MAXZIDE-25) 37.5-25 MG per tablet  Take 1 tablet by mouth daily.     No current facility-administered medications for this visit.   Facility-Administered Medications Ordered in Other Visits  Medication Dose Route Frequency Provider Last Rate Last Dose  . heparin lock flush 100 unit/mL  500 Units Intravenous Once Forest Gleason, MD      . sodium chloride 0.9 % injection 10 mL  10 mL Intravenous PRN Forest Gleason, MD   10 mL at 11/21/14 1108  . sodium chloride 0.9 % injection 10 mL  10 mL Intravenous PRN Forest Gleason, MD   10 mL at 03/21/15 1044  . sodium chloride 0.9 % injection 10 mL  10 mL Intravenous PRN Cougar Imel Gaetana Michaelis, MD   10 mL at 05/02/15 1610    Review of Systems General: no complaints  HEENT: no complaints  Lungs: no complaints  Cardiac: no complaints  GI: no complaints  GU: no complaints  Musculoskeletal: no complaints  Extremities: no complaints  Skin: no complaints  Neuro: no complaints  Endocrine: no complaints  Psych: no complaints                           Objective:  Physical Examination:  BP 170/76 mmHg  Pulse 69  Temp(Src) 98.5 F (36.9 C) (Oral)  Ht '5\' 3"'$  (1.6 m)  Wt 186 lb 2.9 oz (84.45 kg)  BMI 32.99  kg/m2   ECOG Performance Status: 0 - Asymptomatic  General appearance: alert, cooperative and appears stated age HEENT:PERRLA, extra ocular movement intact and sclera clear, anicteric Lymph node survey: non-palpable, axillary, inguinal, supraclavicular Cardiovascular: regular rate and rhythm Respiratory: normal air entry, lungs clear to auscultation Breast exam: Not done Abdomen: soft, non-tender, without masses or organomegaly, hernia at umbilicus and well healed incision. No ascites Extremities: extremities normal, atraumatic, no cyanosis or edema Neurological exam reveals alert, oriented, normal speech, no focal findings or movement disorder noted.  Pelvic: exam chaperoned by nurse;  Vulva: normal appearing vulva with no masses, tenderness or lesions except for healing 1 cm erythematous lesion on the left buttock; Vagina: normal vagina; Adnexa: surgically absent; Uterus: surgically absent, vaginal cuff well healed; Cervix: absent; Rectal: normal rectal, no masses    Lab Review Labs on site today: pending CA125  Radiologic Imaging: None    Assessment:  Leah Santos is a 75 y.o. female diagnosed with stage II   ovarian cancer. Clinically NED. Medical co-morbidities complicating care: HTN, obesity and prior abdominal surgery.  Plan:   Problem List Items Addressed This Visit      Genitourinary   H/O ovarian cancer - Primary   Relevant Orders   CA 125      Clinically NED. Follow up CA125. If elevated imaging.   Follow up with Dr. Oliva Bustard in February as ordered. Follow up with me in July 2017.  If NED after this next year we can lengthen surveillance to every 6 months. If she is NED in 5 years we will refer to gynecology for assistance with surveillance.  Mammogram due January 2017.   We also discussed her weight and need for weight loss, stressed good nutrition, and exercise.    Gillis Ends, MD    CC:  Rusty Aus, MD [place] [address] [place], [place] [address] [phone]

## 2015-05-02 NOTE — Progress Notes (Signed)
Assisted MD with pelvic exam

## 2015-05-03 LAB — CA 125: CA 125: 15.7 U/mL (ref 0.0–38.1)

## 2015-05-06 ENCOUNTER — Emergency Department: Payer: Medicare Other

## 2015-05-06 ENCOUNTER — Emergency Department
Admission: EM | Admit: 2015-05-06 | Discharge: 2015-05-06 | Disposition: A | Discharge status: Home / Self Care | Payer: Medicare Other | Attending: Emergency Medicine | Admitting: Emergency Medicine

## 2015-05-06 ENCOUNTER — Encounter: Payer: Self-pay | Admitting: Emergency Medicine

## 2015-05-06 DIAGNOSIS — Z7951 Long term (current) use of inhaled steroids: Secondary | ICD-10-CM | POA: Diagnosis not present

## 2015-05-06 DIAGNOSIS — Y998 Other external cause status: Secondary | ICD-10-CM | POA: Diagnosis not present

## 2015-05-06 DIAGNOSIS — S92352A Displaced fracture of fifth metatarsal bone, left foot, initial encounter for closed fracture: Secondary | ICD-10-CM | POA: Diagnosis not present

## 2015-05-06 DIAGNOSIS — Y9301 Activity, walking, marching and hiking: Secondary | ICD-10-CM | POA: Insufficient documentation

## 2015-05-06 DIAGNOSIS — I10 Essential (primary) hypertension: Secondary | ICD-10-CM | POA: Insufficient documentation

## 2015-05-06 DIAGNOSIS — W1849XA Other slipping, tripping and stumbling without falling, initial encounter: Secondary | ICD-10-CM | POA: Diagnosis not present

## 2015-05-06 DIAGNOSIS — Y92009 Unspecified place in unspecified non-institutional (private) residence as the place of occurrence of the external cause: Secondary | ICD-10-CM | POA: Diagnosis not present

## 2015-05-06 DIAGNOSIS — S99922A Unspecified injury of left foot, initial encounter: Secondary | ICD-10-CM | POA: Diagnosis present

## 2015-05-06 DIAGNOSIS — S92302A Fracture of unspecified metatarsal bone(s), left foot, initial encounter for closed fracture: Secondary | ICD-10-CM

## 2015-05-06 DIAGNOSIS — Z79899 Other long term (current) drug therapy: Secondary | ICD-10-CM | POA: Insufficient documentation

## 2015-05-06 MED ORDER — IBUPROFEN 600 MG PO TABS
600.0000 mg | ORAL_TABLET | Freq: Once | ORAL | Status: AC
  Administered 2015-05-06: 600 mg via ORAL

## 2015-05-06 MED ORDER — IBUPROFEN 600 MG PO TABS
ORAL_TABLET | ORAL | Status: AC
  Administered 2015-05-06: 600 mg via ORAL
  Filled 2015-05-06: qty 1

## 2015-05-06 MED ORDER — IBUPROFEN 600 MG PO TABS: 600.0000 mg | ORAL_TABLET | Freq: Four times a day (QID) | ORAL | Status: DC | PRN

## 2015-05-06 NOTE — ED Notes (Signed)
Pt states she missed a step on Friday and left foot started hurting and swelling.  Swelling and pain has not improved since then and pain is increased when bearing weight. Left foot is swollen and tender to touch. Redness also noted. Most of the pain is on the bottom of her foot.

## 2015-05-06 NOTE — Discharge Instructions (Signed)
Metatarsal Fracture A metatarsal fracture is a break in a metatarsal bone. Metatarsal bones connect your toe bones to your ankle bones. CAUSES This type of fracture may be caused by:  A sudden twisting of your foot.  A fall onto your foot.  Overuse or repetitive exercise. RISK FACTORS This condition is more likely to develop in people who:  Play contact sports.  Have a bone disease.  Have a low calcium level. SYMPTOMS Symptoms of this condition include:  Pain that is worse when walking or standing.  Pain when pressing on the foot or moving the toes.  Swelling.  Bruising on the top or bottom of the foot.  A foot that appears shorter than the other one. DIAGNOSIS This condition is diagnosed with a physical exam. You may also have imaging tests, such as:  X-rays.  A CT scan.  MRI. TREATMENT Treatment for this condition depends on its severity and whether a bone has moved out of place. Treatment may involve:  Rest.  Wearing foot support such as a cast, splint, or boot for several weeks.  Using crutches.  Surgery to move bones back into the right position. Surgery is usually needed if there are many pieces of broken bone or bones that are very out of place (displaced fracture).  Physical therapy. This may be needed to help you regain full movement and strength in your foot. You will need to return to your health care provider to have X-rays taken until your bones heal. Your health care provider will look at the X-rays to make sure that your foot is healing well. HOME CARE INSTRUCTIONS  If You Have a Cast:  Do not stick anything inside the cast to scratch your skin. Doing that increases your risk of infection.  Check the skin around the cast every day. Report any concerns to your health care provider. You may put lotion on dry skin around the edges of the cast. Do not apply lotion to the skin underneath the cast.  Keep the cast clean and dry. If You Have a Splint  or a Supportive Boot:  Wear it as directed by your health care provider. Remove it only as directed by your health care provider.  Loosen it if your toes become numb and tingle, or if they turn cold and blue.  Keep it clean and dry. Bathing  Do not take baths, swim, or use a hot tub until your health care provider approves. Ask your health care provider if you can take showers. You may only be allowed to take sponge baths for bathing.  If your health care provider approves bathing and showering, cover the cast or splint with a watertight plastic bag to protect it from water. Do not let the cast or splint get wet. Managing Pain, Stiffness, and Swelling  If directed, apply ice to the injured area (if you have a splint, not a cast).  Put ice in a plastic bag.  Place a towel between your skin and the bag.  Leave the ice on for 20 minutes, 2-3 times per day.  Move your toes often to avoid stiffness and to lessen swelling.  Raise (elevate) the injured area above the level of your heart while you are sitting or lying down. Driving  Do not drive or operate heavy machinery while taking pain medicine.  Do not drive while wearing foot support on a foot that you use for driving. Activity  Return to your normal activities as directed by your health care  provider. Ask your health care provider what activities are safe for you.  Perform exercises as directed by your health care provider or physical therapist. Safety  Do not use the injured foot to support your body weight until your health care provider says that you can. Use crutches as directed by your health care provider. General Instructions  Do not put pressure on any part of the cast or splint until it is fully hardened. This may take several hours.  Do not use any tobacco products, including cigarettes, chewing tobacco, or e-cigarettes. Tobacco can delay bone healing. If you need help quitting, ask your health care  provider.  Take medicines only as directed by your health care provider.  Keep all follow-up visits as directed by your health care provider. This is important. SEEK MEDICAL CARE IF:  You have a fever.  Your cast, splint, or boot is too loose or too tight.  Your cast, splint, or boot is damaged.  Your pain medicine is not helping.  You have pain, tingling, or numbness in your foot that is not going away. SEEK IMMEDIATE MEDICAL CARE IF:  You have severe pain.  You have tingling or numbness in your foot that is getting worse.  Your foot feels cold or becomes numb.  Your foot changes color.   This information is not intended to replace advice given to you by your health care provider. Make sure you discuss any questions you have with your health care provider.   Document Released: 12/28/2001 Document Revised: 08/22/2014 Document Reviewed: 02/01/2014 Elsevier Interactive Patient Education 2016 Heil and elevate to reduce swelling. Use walker as needed for extra support. Wear wooden shoe for support and protection of your foot. Call Dr. Ammie Ferrier office tomorrow for an appointment to be seen.

## 2015-05-06 NOTE — ED Notes (Signed)
Follow up information given to patient. Walker education given as well as ace wrap and post op shoe care. Pt and husband verbalized understanding.

## 2015-05-06 NOTE — ED Provider Notes (Signed)
[facility] Emergency Department Provider Note  ____________________________________________  Time seen: Approximately 8:36 AM  I have reviewed the triage vital signs and the nursing notes.   HISTORY  Chief Complaint Foot Pain    HPI Leah Santos is a 75 y.o. female is here complaining of left foot pain. Patient states that she was walking home what she thought was a flat surface when she missed a step and all the way landed on the lateral side of her foot. Patient states it is swollen and pain has increased with weightbearing. Patient has not taken any over-the-counter medication such as Tylenol or Motrin because she was confused at which one to take. She denies any previous fractures in her left foot. She denies actually falling. There was no head injury or loss of consciousness.Patient rates her pain is an 8 out of 10 presently. She also states that she does not take narcotics but generally takes ibuprofen when she does take something for pain.   Past Medical History  Diagnosis Date  . GERD (gastroesophageal reflux disease)   . Hypothyroidism   . Barrett's esophagus   . History of colon polyps   . Ovarian cancer on left (HCC)     No BRCA mutation-Left ovarian cancer, adenocarcinoma.pT2 pNO  M0   . History of chemotherapy     finished total 6 cycles of chemotherapy(initially with carboplatinum Taxol followed by carboplatinum and gemcitabine(February, 2015)  . History of mammogram 04/29/2014  . History of colonoscopy with polypectomy 11/07/13  . History of cellulitis   . History of genital warts 1986  . Asthma   . HTN (hypertension)   . IBS (irritable bowel syndrome)   . Chronic acquired lymphedema     since childhood  . Cellulitis     Right lower extremity    Patient Active Problem List   Diagnosis Date Noted  . Acid reflux 12/27/2014  . History of colon polyps 12/27/2014  . HLD (hyperlipidemia) 12/27/2014  . BP (high blood pressure)  12/27/2014  . Obstructive apnea 12/27/2014  . H/O ovarian cancer 02/16/2014  . Acquired hypothyroidism 02/16/2014    Past Surgical History  Procedure Laterality Date  . Exp. laparotomy, tah, salpingo-oophorectomy, appendectomy  12/2012  . Cholecystectomy  1996  . Breast lumpectomy  1962  . Colonoscopy w/ polypectomy  11/07/13  . Esophagogastroduodenoscopy endoscopy  11/07/13  . Hemorrhoid surgery  1963  . Tubal ligation      Current Outpatient Rx  Name  Route  Sig  Dispense  Refill  . acetaminophen (TYLENOL) 325 MG tablet   Oral   Take 650 mg by mouth every 6 (six) hours as needed for mild pain.         Marland Kitchen albuterol (ACCUNEB) 0.63 MG/3ML nebulizer solution   Nebulization   Take 1 ampule by nebulization every 6 (six) hours as needed for wheezing or shortness of breath.         Marland Kitchen albuterol (PROAIR HFA) 108 (90 BASE) MCG/ACT inhaler   Inhalation   Inhale into the lungs.         . Cholecalciferol (VITAMIN D-3 PO)   Oral   Take 1 tablet by mouth daily.         . Cholecalciferol (VITAMIN D3) 1000 UNITS CAPS   Oral   Take 1 tablet by mouth daily.         Marland Kitchen ibuprofen (ADVIL,MOTRIN) 600 MG tablet   Oral   Take 1 tablet (600 mg total) by mouth  every 6 (six) hours as needed.   30 tablet   0   . levothyroxine (SYNTHROID, LEVOTHROID) 50 MCG tablet   Oral   Take 50 mcg by mouth daily before breakfast.         . mometasone (NASONEX) 50 MCG/ACT nasal spray   Nasal   Place 2 sprays into the nose daily.         . Multiple Vitamins-Minerals (CENTRUM SILVER ADULT 50+ PO)   Oral   Take 1 tablet by mouth daily.         . potassium chloride (KLOR-CON 10) 10 MEQ tablet   Oral   Take 3 tablets by mouth daily.         . Probiotic Product (PROBIOTIC FORMULA PO)   Oral   Take 1 capsule by mouth daily.         . sertraline (ZOLOFT) 100 MG tablet   Oral   Take 1 tablet by mouth daily.         Marland Kitchen torsemide (DEMADEX) 20 MG tablet   Oral   Take 20 mg by mouth  daily.         Marland Kitchen triamterene-hydrochlorothiazide (MAXZIDE-25) 37.5-25 MG per tablet   Oral   Take 1 tablet by mouth daily.           Allergies Statins; Taxol; Sulfa antibiotics; and Tape  Family History  Problem Relation Age of Onset  . Ovarian cancer Mother 36  . Ovarian cancer Maternal Aunt 38  . Breast cancer Maternal Aunt 70  . Lung cancer Maternal Uncle 75  . Prostate cancer Maternal Uncle 65  . Uterine cancer Maternal Aunt 41    Social History Social History  Substance Use Topics  . Smoking status: Never Smoker   . Smokeless tobacco: None  . Alcohol Use: None    Review of Systems Constitutional: No fever/chills ENT: No trauma Cardiovascular: Denies chest pain. Respiratory: Denies shortness of breath. Gastrointestinal: No abdominal pain.  No nausea, no vomiting. Genitourinary: Negative for dysuria. Musculoskeletal: Negative for back pain. Positive left foot pain. Skin: Negative for rash. Neurological: Negative for headaches, focal weakness or numbness.  10-point ROS otherwise negative.  ____________________________________________   PHYSICAL EXAM:  VITAL SIGNS: ED Triage Vitals  Enc Vitals Group     BP --      Pulse --      Resp --      Temp --      Temp src --      SpO2 --      Weight --      Height --      Head Cir --      Peak Flow --      Pain Score --      Pain Loc --      Pain Edu? --      Excl. in [place]? --     Constitutional: Alert and oriented. Well appearing and in no acute distress. Eyes: Conjunctivae are normal. PERRL. EOMI. Head: Atraumatic. Nose: No congestion/rhinnorhea. Neck: No stridor.   Cardiovascular: Normal rate, regular rhythm. Grossly normal heart sounds.  Good peripheral circulation. Respiratory: Normal respiratory effort.  No retractions. Lungs CTAB. Gastrointestinal: Soft and nontender. No distention.  Musculoskeletal: Left foot markedly tender fourth and fifth metatarsal area. No gross deformity was noted.  Motor sensory function distal to the injury is within normal limits. There is moderate soft tissue edema present. Pulses present. Nontender bimalleolar areas to palpation. Gait was not tested secondary  to patient's pain. Neurologic:  Normal speech and language. No gross focal neurologic deficits are appreciated.  Skin:  Skin is warm, dry and intact. No rash noted. Psychiatric: Mood and affect are normal. Speech and behavior are normal.  ____________________________________________   LABS (all labs ordered are listed, but only abnormal results are displayed)  Labs Reviewed - No data to display ____________________________________________  RADIOLOGY  Left foot x-ray is positive for fifth metatarsal fracture per radiologist. Leana Gamer, personally viewed and evaluated these images (plain radiographs) as part of my medical decision making, as well as reviewing the written report by the radiologist. ____________________________________________   PROCEDURES  Procedure(s) performed: None  Critical Care performed: No  ____________________________________________   INITIAL IMPRESSION / ASSESSMENT AND PLAN / ED COURSE  Pertinent labs & imaging results that were available during my care of the patient were reviewed by me and considered in my medical decision making (see chart for details).  Patient was placed in an Ace wrap and a postop shoe. She was given a walker to help support her weight while walking secondary to pain. She is also given ibuprofen while in the emergency room and a prescription for same. She is to follow-up with Dr. Sabra Heck and she is aware that she needs to call the office for an appointment. ____________________________________________   FINAL CLINICAL IMPRESSION(S) / ED DIAGNOSES  Final diagnoses:  Fracture of fifth metatarsal bone, left, closed, initial encounter      Johnn Hai, PA-C 05/06/15 1004  Daymon Larsen, MD 05/06/15 1028

## 2015-05-09 ENCOUNTER — Telehealth: Payer: Self-pay | Admitting: *Deleted

## 2015-05-09 NOTE — Telephone Encounter (Signed)
Pt called cancer center for ca125 results. Results provided. Pt reassured. Also explained that Dr. Theora Gianotti sent her a personal note in the [facility] system.

## 2015-05-11 ENCOUNTER — Ambulatory Visit: Payer: Medicare Other

## 2015-06-13 ENCOUNTER — Inpatient Hospital Stay: Payer: Medicare Other | Attending: Oncology

## 2015-06-13 DIAGNOSIS — Z8543 Personal history of malignant neoplasm of ovary: Secondary | ICD-10-CM | POA: Insufficient documentation

## 2015-06-13 DIAGNOSIS — Z452 Encounter for adjustment and management of vascular access device: Secondary | ICD-10-CM | POA: Diagnosis not present

## 2015-06-13 DIAGNOSIS — Z95828 Presence of other vascular implants and grafts: Secondary | ICD-10-CM

## 2015-06-13 MED ORDER — HEPARIN SOD (PORK) LOCK FLUSH 100 UNIT/ML IV SOLN
500.0000 [IU] | Freq: Once | INTRAVENOUS | Status: AC
  Administered 2015-06-13: 500 [IU] via INTRAVENOUS

## 2015-06-13 MED ORDER — SODIUM CHLORIDE 0.9% FLUSH
10.0000 mL | INTRAVENOUS | Status: DC | PRN
  Administered 2015-06-13: 10 mL via INTRAVENOUS
  Filled 2015-06-13: qty 10

## 2015-06-20 ENCOUNTER — Ambulatory Visit: Payer: Medicare Other | Attending: Oncology

## 2015-06-27 ENCOUNTER — Inpatient Hospital Stay: Payer: Medicare Other

## 2015-06-27 ENCOUNTER — Inpatient Hospital Stay: Payer: Medicare Other | Attending: Oncology | Admitting: Oncology

## 2015-06-27 ENCOUNTER — Encounter: Payer: Self-pay | Admitting: Oncology

## 2015-06-27 VITALS — BP 142/84 | HR 87 | Temp 96.8°F | Resp 18 | Wt 186.7 lb

## 2015-06-27 DIAGNOSIS — Z801 Family history of malignant neoplasm of trachea, bronchus and lung: Secondary | ICD-10-CM

## 2015-06-27 DIAGNOSIS — Z86010 Personal history of colonic polyps: Secondary | ICD-10-CM | POA: Insufficient documentation

## 2015-06-27 DIAGNOSIS — E039 Hypothyroidism, unspecified: Secondary | ICD-10-CM | POA: Diagnosis not present

## 2015-06-27 DIAGNOSIS — I10 Essential (primary) hypertension: Secondary | ICD-10-CM | POA: Diagnosis not present

## 2015-06-27 DIAGNOSIS — J45909 Unspecified asthma, uncomplicated: Secondary | ICD-10-CM | POA: Diagnosis not present

## 2015-06-27 DIAGNOSIS — C569 Malignant neoplasm of unspecified ovary: Secondary | ICD-10-CM

## 2015-06-27 DIAGNOSIS — Z8543 Personal history of malignant neoplasm of ovary: Secondary | ICD-10-CM

## 2015-06-27 DIAGNOSIS — I890 Lymphedema, not elsewhere classified: Secondary | ICD-10-CM | POA: Diagnosis not present

## 2015-06-27 DIAGNOSIS — K219 Gastro-esophageal reflux disease without esophagitis: Secondary | ICD-10-CM | POA: Diagnosis not present

## 2015-06-27 DIAGNOSIS — Z79899 Other long term (current) drug therapy: Secondary | ICD-10-CM | POA: Insufficient documentation

## 2015-06-27 DIAGNOSIS — K2270 Barrett's esophagus without dysplasia: Secondary | ICD-10-CM

## 2015-06-27 DIAGNOSIS — Z803 Family history of malignant neoplasm of breast: Secondary | ICD-10-CM

## 2015-06-27 DIAGNOSIS — E876 Hypokalemia: Secondary | ICD-10-CM | POA: Diagnosis not present

## 2015-06-27 DIAGNOSIS — Z808 Family history of malignant neoplasm of other organs or systems: Secondary | ICD-10-CM | POA: Insufficient documentation

## 2015-06-27 DIAGNOSIS — Z8041 Family history of malignant neoplasm of ovary: Secondary | ICD-10-CM | POA: Diagnosis not present

## 2015-06-27 DIAGNOSIS — Z9221 Personal history of antineoplastic chemotherapy: Secondary | ICD-10-CM | POA: Diagnosis not present

## 2015-06-27 DIAGNOSIS — Z1231 Encounter for screening mammogram for malignant neoplasm of breast: Secondary | ICD-10-CM

## 2015-06-27 LAB — CBC WITH DIFFERENTIAL/PLATELET
BASOS ABS: 0 10*3/uL (ref 0–0.1)
BASOS PCT: 1 %
EOS ABS: 0.2 10*3/uL (ref 0–0.7)
EOS PCT: 3 %
HCT: 40.8 % (ref 35.0–47.0)
Hemoglobin: 14.3 g/dL (ref 12.0–16.0)
Lymphocytes Relative: 20 %
Lymphs Abs: 1.5 10*3/uL (ref 1.0–3.6)
MCH: 30.6 pg (ref 26.0–34.0)
MCHC: 35.1 g/dL (ref 32.0–36.0)
MCV: 87.2 fL (ref 80.0–100.0)
MONO ABS: 0.3 10*3/uL (ref 0.2–0.9)
Monocytes Relative: 4 %
NEUTROS ABS: 5.7 10*3/uL (ref 1.4–6.5)
Neutrophils Relative %: 74 %
PLATELETS: 151 10*3/uL (ref 150–440)
RBC: 4.68 MIL/uL (ref 3.80–5.20)
RDW: 14.7 % — AB (ref 11.5–14.5)
WBC: 7.7 10*3/uL (ref 3.6–11.0)

## 2015-06-27 LAB — COMPREHENSIVE METABOLIC PANEL
ALBUMIN: 3.9 g/dL (ref 3.5–5.0)
ALT: 55 U/L — ABNORMAL HIGH (ref 14–54)
ANION GAP: 7 (ref 5–15)
AST: 44 U/L — AB (ref 15–41)
Alkaline Phosphatase: 78 U/L (ref 38–126)
BUN: 21 mg/dL — AB (ref 6–20)
CHLORIDE: 98 mmol/L — AB (ref 101–111)
CO2: 28 mmol/L (ref 22–32)
Calcium: 9.2 mg/dL (ref 8.9–10.3)
Creatinine, Ser: 0.96 mg/dL (ref 0.44–1.00)
GFR calc Af Amer: >60 mL/min (ref >60)
GFR calc non Af Amer: 57 mL/min — ABNORMAL LOW (ref >60)
GLUCOSE: 204 mg/dL — AB (ref 65–99)
POTASSIUM: 3.1 mmol/L — AB (ref 3.5–5.1)
Sodium: 133 mmol/L — ABNORMAL LOW (ref 135–145)
Total Bilirubin: 0.7 mg/dL (ref 0.3–1.2)
Total Protein: 7.4 g/dL (ref 6.5–8.1)

## 2015-06-27 NOTE — Progress Notes (Signed)
[place] @ [facility] [phone]  Fax:([phone] [place]: 06-May-1940  MR#: [id number]  HUT#:[id number]  Patient Care Team: Rusty Aus, MD as PCP - General (Internal Medicine)  CHIEF COMPLAINT:  Chief Complaint  Patient presents with  . Ovarian Cancer   Chief Complaint/Diagnosis:   1.  Left ovarian cancer, adenocarcinoma.pT2 pNO  M0  fIGO STAGING II B. Status post bilateral oopherectomy, omentectomy, lymph node dissection on December 21, 2012. No BRCA mutation. 2.  Started on carboplatinum and Taxol from October, 2014. 3.  Allergic reaction to Taxol on 2nd treatment.  Taxol was discontinued and started on gemcitabine 4.  Patient has finished total 6 cycles of chemotherapy(initially with carboplatinum Taxol followed by carboplatinum and gemcitabine(February, 2015).  No history exists.     INTERVAL HISTORY:   Patient is here for ongoing evaluation regarding ovarian cancer. No abdominal pain.  No nausea.  No vomiting. Appetite has been stable. Abdominal pain no nausea no vomiting no diarrhea. REVIEW OF SYSTEMS:   GENERAL:  Feels good.  Active.  No fevers, sweats or weight loss. PERFORMANCE STATUS (ECOG): 01 HEENT:  No visual changes, runny nose, sore throat, mouth sores or tenderness. Lungs: No shortness of breath or cough.  No hemoptysis. Cardiac:  No chest pain, palpitations, orthopnea, or PND. GI:  No nausea, vomiting, diarrhea, constipation, melena or hematochezia. GU:  No urgency, frequency, dysuria, or hematuria. Musculoskeletal:  No back pain.  No joint pain.  No muscle tenderness. Extremities:  No pain or swelling. Skin:  No rashes or skin changes. Neuro:  No headache, numbness or weakness, balance or coordination issues. Endocrine:  No diabetes, thyroid issues, hot flashes or night sweats. Psych:  No mood changes, depression or anxiety. Pain:  No focal pain. Review of systems:  All other systems reviewed and found to be  negative. As per HPI. Otherwise, a complete review of systems is negatve.  PAST MEDICAL HISTORY: Past Medical History  Diagnosis Date  . GERD (gastroesophageal reflux disease)   . Hypothyroidism   . Barrett's esophagus   . History of colon polyps   . Ovarian cancer on left (HCC)     No BRCA mutation-Left ovarian cancer, adenocarcinoma.pT2 pNO  M0   . History of chemotherapy     finished total 6 cycles of chemotherapy(initially with carboplatinum Taxol followed by carboplatinum and gemcitabine(February, 2015)  . History of mammogram 04/29/2014  . History of colonoscopy with polypectomy 11/07/13  . History of cellulitis   . History of genital warts 1986  . Asthma   . HTN (hypertension)   . IBS (irritable bowel syndrome)   . Chronic acquired lymphedema     since childhood  . Cellulitis     Right lower extremity    PAST SURGICAL HISTORY: Past Surgical History  Procedure Laterality Date  . Exp. laparotomy, tah, salpingo-oophorectomy, appendectomy  12/2012  . Cholecystectomy  1996  . Breast lumpectomy  1962  . Colonoscopy w/ polypectomy  11/07/13  . Esophagogastroduodenoscopy endoscopy  11/07/13  . Hemorrhoid surgery  1963  . Tubal ligation      FAMILY HISTORY Family History  Problem Relation Age of Onset  . Ovarian cancer Mother 87  . Ovarian cancer Maternal Aunt 38  . Breast cancer Maternal Aunt 70  . Lung cancer Maternal Uncle 75  . Prostate cancer Maternal Uncle 65  . Uterine cancer Maternal Aunt 41    ADVANCED DIRECTIVES:  No flowsheet data found.  HEALTH MAINTENANCE: Social  History  Substance Use Topics  . Smoking status: Never Smoker   . Smokeless tobacco: None  . Alcohol Use: None      Allergies  Allergen Reactions  . Statins Other (See Comments)    Muscle cramps  . Taxol [Paclitaxel]     Allergic reaction to Taxol ON 2nd treatment.  Taxol was discontinued and started on gemcitabine   . Sulfa Antibiotics Rash  . Tape Rash    Skin irritation with  prolonged use (bandages with latex)    Current Outpatient Prescriptions  Medication Sig Dispense Refill  . acetaminophen (TYLENOL) 325 MG tablet Take 650 mg by mouth every 6 (six) hours as needed for mild pain.    Marland Kitchen albuterol (ACCUNEB) 0.63 MG/3ML nebulizer solution Take 1 ampule by nebulization every 6 (six) hours as needed for wheezing or shortness of breath.    Marland Kitchen albuterol (PROAIR HFA) 108 (90 BASE) MCG/ACT inhaler Inhale into the lungs.    . Cholecalciferol (VITAMIN D-3 PO) Take 1 tablet by mouth daily.    . Cholecalciferol (VITAMIN D3) 1000 UNITS CAPS Take 1 tablet by mouth daily.    Marland Kitchen ibuprofen (ADVIL,MOTRIN) 600 MG tablet Take 1 tablet (600 mg total) by mouth every 6 (six) hours as needed. 30 tablet 0  . levothyroxine (SYNTHROID, LEVOTHROID) 50 MCG tablet Take 50 mcg by mouth daily before breakfast.    . mometasone (NASONEX) 50 MCG/ACT nasal spray Place 2 sprays into the nose daily.    . Multiple Vitamins-Minerals (CENTRUM SILVER ADULT 50+ PO) Take 1 tablet by mouth daily.    . potassium chloride (KLOR-CON 10) 10 MEQ tablet Take 3 tablets by mouth daily.    . Probiotic Product (PROBIOTIC FORMULA PO) Take 1 capsule by mouth daily.    . sertraline (ZOLOFT) 100 MG tablet Take 1 tablet by mouth daily.    Marland Kitchen torsemide (DEMADEX) 20 MG tablet Take 20 mg by mouth daily.    Marland Kitchen triamterene-hydrochlorothiazide (MAXZIDE-25) 37.5-25 MG per tablet Take 1 tablet by mouth daily.     No current facility-administered medications for this visit.   Facility-Administered Medications Ordered in Other Visits  Medication Dose Route Frequency Provider Last Rate Last Dose  . heparin lock flush 100 unit/mL  500 Units Intravenous Once Johney Maine, MD      . sodium chloride 0.9 % injection 10 mL  10 mL Intravenous PRN Johney Maine, MD   10 mL at 11/21/14 1108  . sodium chloride 0.9 % injection 10 mL  10 mL Intravenous PRN Johney Maine, MD   10 mL at 03/21/15 1044    OBJECTIVE:  Filed Vitals:   06/27/15 1216   BP: 142/84  Pulse: 87  Temp: 96.8 F (36 C)  Resp: 18     Body mass index is 33.09 kg/(m^2).    ECOG FS:1 - Symptomatic but completely ambulatory  PHYSICAL EXAM: GENERAL:  Well developed, well nourished, sitting comfortably in the exam room in no acute distress. MENTAL STATUS:  Alert and oriented to person, place and time.  ENT:  Oropharynx clear without lesion.  Tongue normal. Mucous membranes moist.  RESPIRATORY:  Clear to auscultation without rales, wheezes or rhonchi. CARDIOVASCULAR:  Regular rate and rhythm without murmur, rub or gallop. BREAST:  Right breast without masses, skin changes or nipple discharge.  Left breast without masses, skin changes or nipple discharge. ABDOMEN:  Soft, non-tender, with active bowel sounds, and no hepatosplenomegaly.  No masses. BACK:  No CVA tenderness.  No tenderness on percussion of  the back or rib cage. SKIN:  No rashes, ulcers or lesions. EXTREMITIES: No edema, no skin discoloration or tenderness.  No palpable cords. LYMPH NODES: No palpable cervical, supraclavicular, axillary or inguinal adenopathy  NEUROLOGICAL: Unremarkable. PSYCH:  Appropriate.   LAB RESULTS:  CBC Latest Ref Rng 06/27/2015 12/27/2014  WBC 3.6 - 11.0 K/uL 7.7 6.9  Hemoglobin 12.0 - 16.0 g/dL 14.3 13.8  Hematocrit 35.0 - 47.0 % 40.8 40.1  Platelets 150 - 440 K/uL 151 150      CA 125 0.0 - 38.1 U/mL          ASSESSMENT: Carcinoma of ovary stage II disease status post debulking surgery followed by chemotherapy CA 125 is within normal limit No evidence of recurrent disease on clinical examination 2.  Hypokalemia potassium is 2.7. d MEDICAL DECISION MAKING:  1. IScreening mammogram in January of 216 was within normal limits her repeat mammogram now 2. See md in 6 months with cbc, met c,  caI-125 is within normal limit Potassium is improved is still 3.1 Patient expressed understanding and was in agreement with this plan. She also understands that She can call  clinic at any time with any questions, concerns, or complaints.   follow-up in 6 months in the interim.  Patient would be evaluated by GYN oncologist  No matching staging information was found for the patient.  Forest Gleason, MD   06/27/2015 12:37 PM

## 2015-06-27 NOTE — Progress Notes (Signed)
Patient rescheduled mammogram for next week due to foot injury.

## 2015-06-28 LAB — CA 125: CA 125: 17.2 U/mL (ref 0.0–38.1)

## 2015-07-01 ENCOUNTER — Encounter: Payer: Self-pay | Admitting: Oncology

## 2015-07-06 ENCOUNTER — Telehealth: Payer: Self-pay | Admitting: *Deleted

## 2015-07-06 ENCOUNTER — Other Ambulatory Visit: Payer: Self-pay | Admitting: Family Medicine

## 2015-07-06 ENCOUNTER — Ambulatory Visit: Payer: Medicare Other

## 2015-07-06 NOTE — Telephone Encounter (Signed)
Patient called asking for CA125 results.  Called patient - results CA125 17.2 - Normal.  Patient verbalized understanding and appreciative of call.

## 2015-08-02 ENCOUNTER — Ambulatory Visit: Payer: Medicare Other

## 2015-08-03 ENCOUNTER — Other Ambulatory Visit: Payer: Self-pay | Admitting: Oncology

## 2015-08-03 ENCOUNTER — Ambulatory Visit
Admission: RE | Admit: 2015-08-03 | Discharge: 2015-08-03 | Disposition: A | Discharge status: Home / Self Care | Payer: Medicare Other | Source: Ambulatory Visit | Attending: Oncology | Admitting: Oncology

## 2015-08-03 DIAGNOSIS — Z1231 Encounter for screening mammogram for malignant neoplasm of breast: Secondary | ICD-10-CM

## 2015-08-03 DIAGNOSIS — Z8543 Personal history of malignant neoplasm of ovary: Secondary | ICD-10-CM

## 2015-10-24 ENCOUNTER — Inpatient Hospital Stay: Payer: Medicare Other | Attending: Obstetrics and Gynecology | Admitting: Obstetrics and Gynecology

## 2015-10-24 ENCOUNTER — Inpatient Hospital Stay: Payer: Medicare Other

## 2015-10-24 VITALS — BP 154/83 | HR 81 | Temp 97.1°F | Ht 63.0 in | Wt 187.1 lb

## 2015-10-24 DIAGNOSIS — E559 Vitamin D deficiency, unspecified: Secondary | ICD-10-CM | POA: Insufficient documentation

## 2015-10-24 DIAGNOSIS — N390 Urinary tract infection, site not specified: Secondary | ICD-10-CM | POA: Diagnosis not present

## 2015-10-24 DIAGNOSIS — N904 Leukoplakia of vulva: Secondary | ICD-10-CM | POA: Diagnosis not present

## 2015-10-24 DIAGNOSIS — Z79899 Other long term (current) drug therapy: Secondary | ICD-10-CM | POA: Insufficient documentation

## 2015-10-24 DIAGNOSIS — K2270 Barrett's esophagus without dysplasia: Secondary | ICD-10-CM | POA: Diagnosis not present

## 2015-10-24 DIAGNOSIS — K589 Irritable bowel syndrome without diarrhea: Secondary | ICD-10-CM | POA: Insufficient documentation

## 2015-10-24 DIAGNOSIS — Z8543 Personal history of malignant neoplasm of ovary: Secondary | ICD-10-CM

## 2015-10-24 DIAGNOSIS — Z9221 Personal history of antineoplastic chemotherapy: Secondary | ICD-10-CM | POA: Diagnosis not present

## 2015-10-24 DIAGNOSIS — Z803 Family history of malignant neoplasm of breast: Secondary | ICD-10-CM | POA: Insufficient documentation

## 2015-10-24 DIAGNOSIS — J45909 Unspecified asthma, uncomplicated: Secondary | ICD-10-CM | POA: Insufficient documentation

## 2015-10-24 DIAGNOSIS — Z90722 Acquired absence of ovaries, bilateral: Secondary | ICD-10-CM | POA: Diagnosis not present

## 2015-10-24 DIAGNOSIS — A630 Anogenital (venereal) warts: Secondary | ICD-10-CM | POA: Insufficient documentation

## 2015-10-24 DIAGNOSIS — C569 Malignant neoplasm of unspecified ovary: Secondary | ICD-10-CM

## 2015-10-24 DIAGNOSIS — R140 Abdominal distension (gaseous): Secondary | ICD-10-CM | POA: Insufficient documentation

## 2015-10-24 DIAGNOSIS — R0681 Apnea, not elsewhere classified: Secondary | ICD-10-CM | POA: Insufficient documentation

## 2015-10-24 DIAGNOSIS — E039 Hypothyroidism, unspecified: Secondary | ICD-10-CM | POA: Diagnosis not present

## 2015-10-24 DIAGNOSIS — Z86010 Personal history of colonic polyps: Secondary | ICD-10-CM | POA: Insufficient documentation

## 2015-10-24 DIAGNOSIS — Z801 Family history of malignant neoplasm of trachea, bronchus and lung: Secondary | ICD-10-CM | POA: Diagnosis not present

## 2015-10-24 DIAGNOSIS — Z8042 Family history of malignant neoplasm of prostate: Secondary | ICD-10-CM | POA: Insufficient documentation

## 2015-10-24 DIAGNOSIS — E669 Obesity, unspecified: Secondary | ICD-10-CM | POA: Diagnosis not present

## 2015-10-24 DIAGNOSIS — E876 Hypokalemia: Secondary | ICD-10-CM | POA: Insufficient documentation

## 2015-10-24 DIAGNOSIS — E785 Hyperlipidemia, unspecified: Secondary | ICD-10-CM | POA: Insufficient documentation

## 2015-10-24 DIAGNOSIS — Z6833 Body mass index (BMI) 33.0-33.9, adult: Secondary | ICD-10-CM | POA: Diagnosis not present

## 2015-10-24 DIAGNOSIS — Z90710 Acquired absence of both cervix and uterus: Secondary | ICD-10-CM | POA: Diagnosis not present

## 2015-10-24 DIAGNOSIS — I10 Essential (primary) hypertension: Secondary | ICD-10-CM | POA: Diagnosis not present

## 2015-10-24 NOTE — Progress Notes (Signed)
Patient here for follow up. Patient has complaints of gas.

## 2015-10-24 NOTE — Progress Notes (Addendum)
Gynecologic Oncology Interval Visit   Primary Care Provider: Rusty Aus, MD [facility] [place], [place] [address] [phone]  Referring MD: Dr. Ferne Reus (no longer here)  Chief Concern: Ovarian cancer surveillance  Subjective:  Leah Santos is a 75 y.o. female who is seen for ovarian cancer surveillance.   She was seen by Dr. Oliva Bustard on 06/27/2015 with a negative exam. She did have hypokalemia and he advised her regarding supplementation and follow up with PCP. She complains of bloating, but this has been chronic.   She is currently on an antibiotic for UTI.    Lab Results  Component Value Date   CA125 17.2 06/27/2015   Mammogram: 05/09/2014 BI-RADS CATEGORY  1: Negative. Last breast exam performed by Dr. Oliva Bustard on 12/27/2014: negative based on documentation. Mammogram 07/24/2015 BI-RADS1    Gynecologic Oncologym History Leah Santos was diagnosed with left ovarian cancer, adenocarcinoma.pT2 pNO  M0, FIGOSTAGING II B. Status post bilateral oopherectomy, omentectomy, lymph node dissection on December 21, 2012.  -Started on carboplatinum and Taxol from October, 2014. -Allergic reaction to Taxol on 2nd treatment.  Taxol was discontinued and started on gemcitabine -Patient has finished total 6 cycles of chemotherapy(initially with carboplatinum Taxol followed by carboplatinum and gemcitabine(February, 2015).  Pelvic MRI 11/21/2013  FINDINGS: Previous hysterectomy noted. Simple appearing cyst in the right adnexa shows significant decrease in size since previous study, currently measuring 1.2 x 2.5 cm on image 23 of series 9 compared to 4.5 x 5.9 cm previously. This is consistent with a resolving postoperative fluid collection such as a lymphocele or seroma. Another simple appearing cyst in the left adnexa measures 3.0 x 4.4 cm. This remains stable in size and appearance since prior exam, and is also suspicious for a  postoperative fluid collection. No internal septations or solid mural nodules are visualized.  IMPRESSION: No acute findings or radiographic signs of pelvic metastatic disease.  Near complete resolution of right adnexal fluid collection, consistent with resolving postoperative lymphocele or seroma.  Stable simple appearing left adnexal cyst, most consistent with persistent postoperative lymphocele or seroma.   Genetic testing: No BRCA mutation.  Health care maintenance: Dr. Oliva Bustard ordered her mammograms and performed cinical breast exams last past year .   Problem List: Patient Active Problem List   Diagnosis Date Noted  . Ovarian cancer ([place]) 10/24/2015  . Avitaminosis D 02/21/2015  . Acid reflux 12/27/2014  . History of colon polyps 12/27/2014  . HLD (hyperlipidemia) 12/27/2014  . BP (high blood pressure) 12/27/2014  . Obstructive apnea 12/27/2014  . H/O ovarian cancer 02/16/2014  . Acquired hypothyroidism 02/16/2014    Past Medical History: Past Medical History  Diagnosis Date  . GERD (gastroesophageal reflux disease)   . Hypothyroidism   . Barrett's esophagus   . History of colon polyps   . Ovarian cancer on left (HCC)     No BRCA mutation-Left ovarian cancer, adenocarcinoma.pT2 pNO  M0   . History of chemotherapy     finished total 6 cycles of chemotherapy(initially with carboplatinum Taxol followed by carboplatinum and gemcitabine(February, 2015)  . History of mammogram 04/29/2014  . History of colonoscopy with polypectomy 11/07/13  . History of cellulitis   . History of genital warts 1986  . Asthma   . HTN (hypertension)   . IBS (irritable bowel syndrome)   . Chronic acquired lymphedema     since childhood  . Cellulitis     Right lower extremity    Past Surgical  History: Past Surgical History  Procedure Laterality Date  . Exp. laparotomy, tah, salpingo-oophorectomy, appendectomy  12/2012  . Cholecystectomy  1996  . Breast lumpectomy  1962  .  Colonoscopy w/ polypectomy  11/07/13  . Esophagogastroduodenoscopy endoscopy  11/07/13  . Hemorrhoid surgery  1963  . Tubal ligation    . Breast biopsy Right 1994    neg    Past Gynecologic History:  As per HPI  OB History:  OB History  Gravida Para Term Preterm AB SAB TAB Ectopic Multiple Living  _0 # Outcome Date GA Lbr Len/2nd Weight Sex Delivery Anes PTL Lv  3 Term           2 Term           1 Term             Obstetric Comments  Onset of menses at age 87    Family History: Family History  Problem Relation Age of Onset  . Ovarian cancer Mother 22  . Breast cancer Maternal Aunt   . Breast cancer Maternal Aunt 70  . Lung cancer Maternal Uncle 75  . Prostate cancer Maternal Uncle 65  . Uterine cancer Maternal Aunt 41    Social History: Social History   Social History  . Marital Status: Married    Spouse Name: N/A  . Number of Children: N/A  . Years of Education: N/A   Occupational History  . Not on file.   Social History Main Topics  . Smoking status: Never Smoker   . Smokeless tobacco: Not on file  . Alcohol Use: No  . Drug Use: No  . Sexual Activity: Not on file   Other Topics Concern  . Not on file   Social History Narrative    Allergies: Allergies  Allergen Reactions  . Statins Other (See Comments)    Muscle cramps  . Taxol [Paclitaxel]     Allergic reaction to Taxol ON 2nd treatment.  Taxol was discontinued and started on gemcitabine   . Sulfa Antibiotics Rash  . Tape Rash    Skin irritation with prolonged use (bandages with latex)    Current Medications: Current Outpatient Prescriptions  Medication Sig Dispense Refill  . Cholecalciferol (VITAMIN D-3 PO) Take 1 tablet by mouth daily.    . Cholecalciferol (VITAMIN D3) 1000 UNITS CAPS Take 1 tablet by mouth daily.    Marland Kitchen levothyroxine (SYNTHROID, LEVOTHROID) 50 MCG tablet Take 50 mcg by mouth daily before breakfast.    . mometasone (NASONEX) 50 MCG/ACT nasal spray Place 2  sprays into the nose daily.    . Multiple Vitamins-Minerals (CENTRUM SILVER ADULT 50+ PO) Take 1 tablet by mouth daily.    . potassium chloride (KLOR-CON 10) 10 MEQ tablet Take 3 tablets by mouth daily.    . Probiotic Product (PROBIOTIC FORMULA PO) Take 1 capsule by mouth daily.    . sertraline (ZOLOFT) 100 MG tablet Take 1 tablet by mouth daily.    Marland Kitchen triamterene-hydrochlorothiazide (MAXZIDE-25) 37.5-25 MG per tablet Take 1 tablet by mouth daily.    Marland Kitchen acetaminophen (TYLENOL) 325 MG tablet Take 650 mg by mouth every 6 (six) hours as needed for mild pain. Reported on 10/24/2015    . ibuprofen (ADVIL,MOTRIN) 600 MG tablet Take 1 tablet (600 mg total) by mouth every 6 (six) hours as needed. (Patient not taking: Reported on 10/24/2015) 30 tablet 0  . torsemide (DEMADEX) 20 MG tablet Take  20 mg by mouth daily. Reported on 10/24/2015     No current facility-administered medications for this visit.   Facility-Administered Medications Ordered in Other Visits  Medication Dose Route Frequency Provider Last Rate Last Dose  . heparin lock flush 100 unit/mL  500 Units Intravenous Once Forest Gleason, MD      . sodium chloride 0.9 % injection 10 mL  10 mL Intravenous PRN Forest Gleason, MD   10 mL at 11/21/14 1108  . sodium chloride 0.9 % injection 10 mL  10 mL Intravenous PRN Forest Gleason, MD   10 mL at 03/21/15 1044    Review of Systems General: no complaints  HEENT: no complaints  Lungs: no complaints  Cardiac: no complaints  GI: bloating otherwise no complaints  GU: no complaints  Musculoskeletal: no complaints  Extremities: no complaints  Skin: no complaints  Neuro: no complaints  Endocrine: no complaints  Psych: no complaints                           Objective:  Physical Examination:  BP 154/83 mmHg  Pulse 81  Temp(Src) 97.1 F (36.2 C) (Tympanic)  Ht _0  (1.6 m)  Wt 187 lb 1.7 oz (84.87 kg)  BMI 33.15 kg/m2   ECOG Performance Status: 0 - Asymptomatic  General appearance:  alert, cooperative and appears stated age HEENT:PERRLA, extra ocular movement intact and sclera clear, anicteric Lymph node survey: non-palpable, axillary, inguinal, supraclavicular Cardiovascular: regular rate and rhythm Respiratory: normal air entry, lungs clear to auscultation Breast exam: Not done Abdomen: soft, non-tender, without masses or organomegaly, hernia at umbilicus and well healed incision. No ascites Extremities: extremities normal, atraumatic, no cyanosis or edema Neurological exam reveals alert, oriented, normal speech, no focal findings or movement disorder noted.  Pelvic: exam chaperoned by nurse;  Vulva: normal appearing vulva with no masses, tenderness, the previously seen 1 cm erythematous lesion has resolved, she has an area of white epithelium at the vaginal introitus that is not raised; Vagina: normal vagina; Adnexa: surgically absent; Uterus: surgically absent, vaginal cuff well healed; Cervix: absent; Rectal: normal rectal, no masses    Lab Review Labs on site today: pending CA125  Radiologic Imaging: None    Assessment:  Leah Santos is a 75 y.o. female diagnosed with stage II   ovarian cancer. Clinically NED. Vulvar leukoplakia, asymptomatic and currently on an antibiotic for UTI and may represent candidiasis.  Body mass index is 33.15 kg/(m^2).  Medical co-morbidities complicating care: HTN, obesity and prior abdominal surgery.  Plan:   Problem List Items Addressed This Visit      Genitourinary   Ovarian cancer ([place]) - Primary      Clinically NED. Follow up CA125. If elevated imaging.   With regard to her vulvar findings I discussed the use of over the counter antifungal agents. At her next visit we will reassess this area. If still present consider biopsy.   We will need to arrange for follow up with Dr. Metro Kung replacement in 3 months for CA125. RTC to our clinic in 6 months. After 05/2016 visit we can alternate every 6 months.  If she is  NED in 5 years we will refer to gynecology for assistance with surveillance.  Mammogram completed by Dr. Oliva Bustard 2017. I asked the patient to ask Dr. Sabra Heck if he can order her screening mammograms in the future.    We also discussed her weight and need for weight loss, stressed good  nutrition, and exercise.    Gillis Ends, MD    CC:  Rusty Aus, MD [facility] [place], [place] [address] [phone]

## 2015-10-25 LAB — CA 125: CA 125: 15.7 U/mL (ref 0.0–38.1)

## 2016-01-08 IMAGING — CT CT ABD-PELV W/ CM
2 of 5 series · 17 of 46 positions shown, 19 images · IV contrast (isovue)
Comparison: 01/14/2013 and 09/09/2006.

CLINICAL DATA: Ovarian cancer. Abdominal and pelvic pain with
bloating.

EXAM:
CT ABDOMEN AND PELVIS WITH CONTRAST
TECHNIQUE: Multidetector CT imaging of the abdomen and pelvis was performed
using the standard protocol following bolus administration of
intravenous contrast.
CONTRAST:  100 cc Isovue 370.

[Series 2: routine with · axial · 0.79mm/px · z∈[-1008,-638]mm · 14 of 86 slices shown, 16 images]
[im 6/86  soft-tissue]
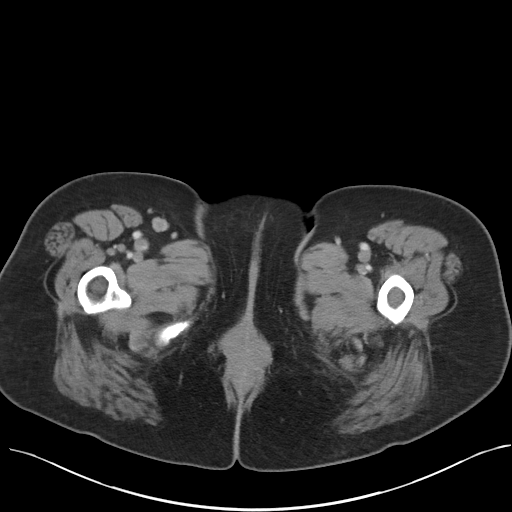
[im 6/86  bone]
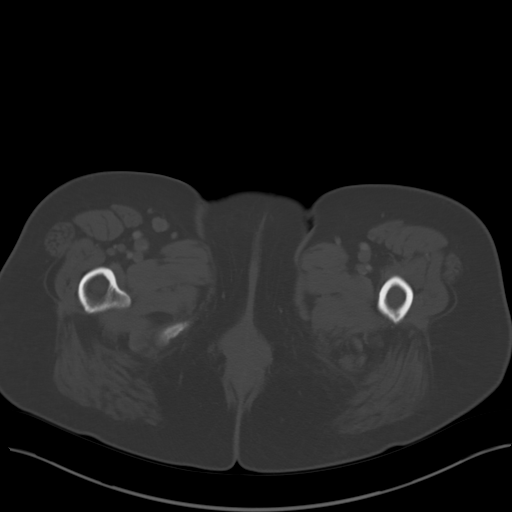
[im 11/86  soft-tissue]
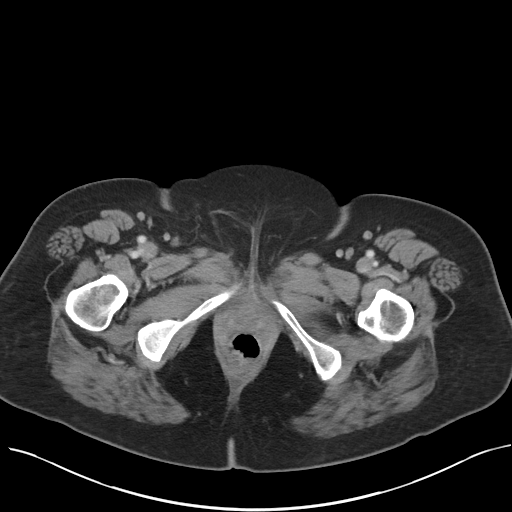
[im 16/86  soft-tissue]
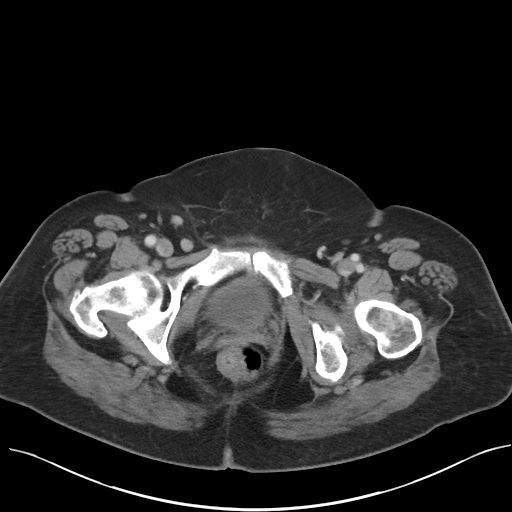
[im 22/86  soft-tissue]
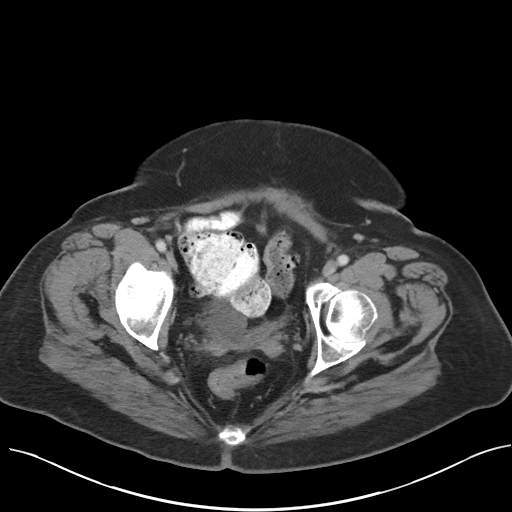
[im 27/86  soft-tissue]
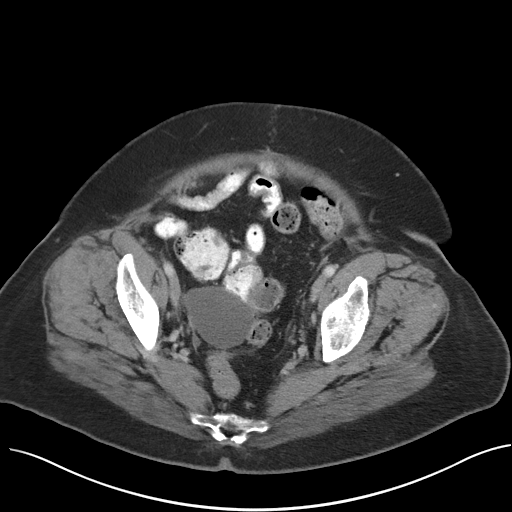
[im 32/86  soft-tissue]
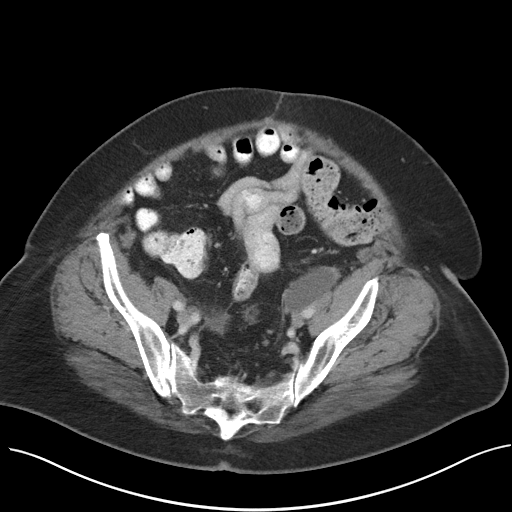
[im 38/86  soft-tissue]
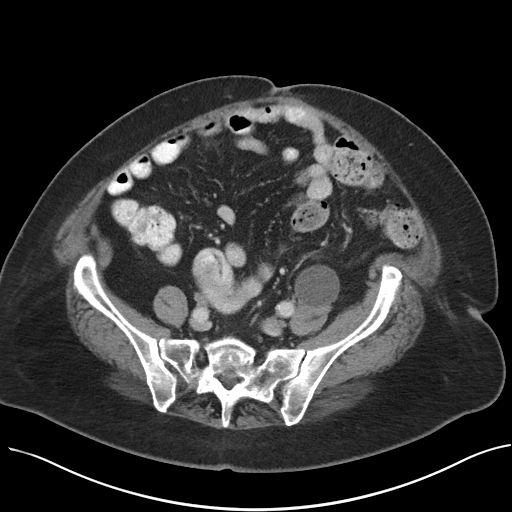
[im 48/86  soft-tissue]
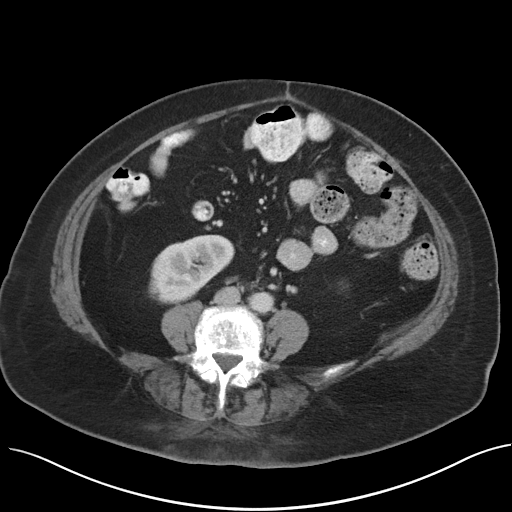
[im 54/86  soft-tissue]
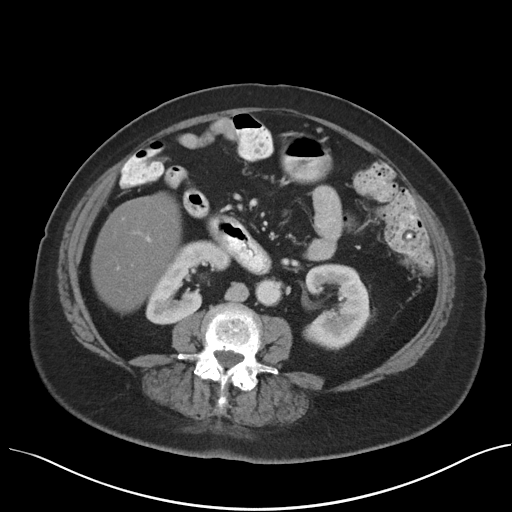
[im 54/86  bone]
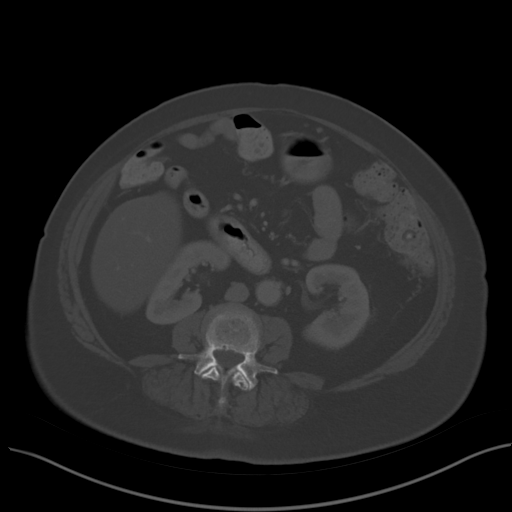
[im 59/86  soft-tissue]
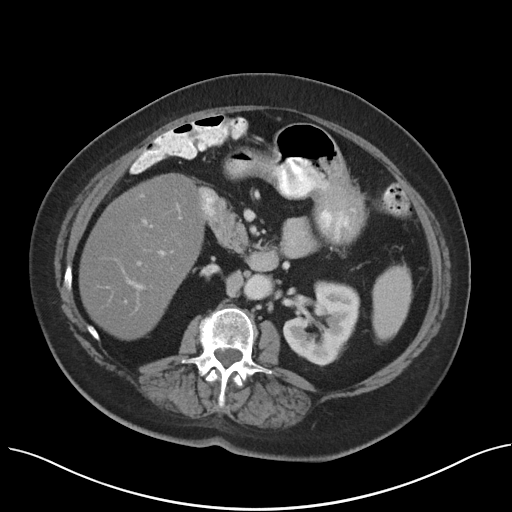
[im 64/86  soft-tissue]
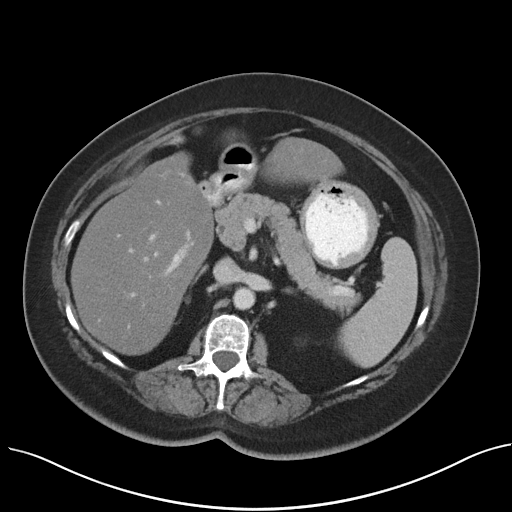
[im 70/86  soft-tissue]
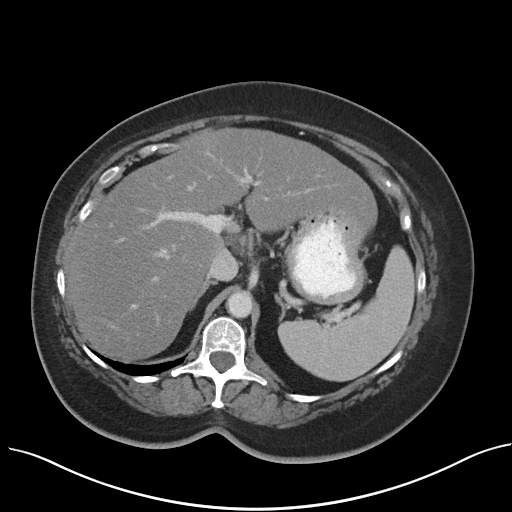
[im 75/86  soft-tissue]
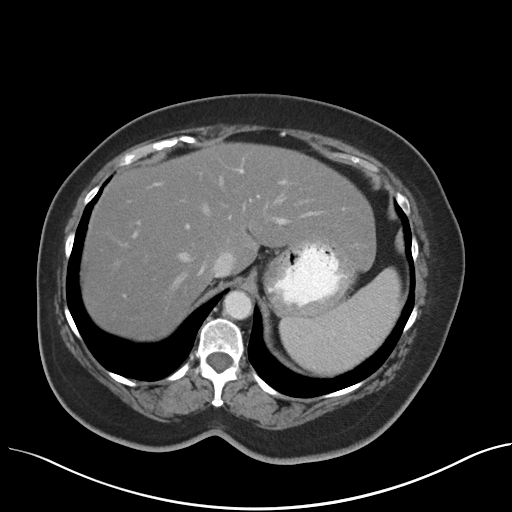
[im 80/86  soft-tissue]
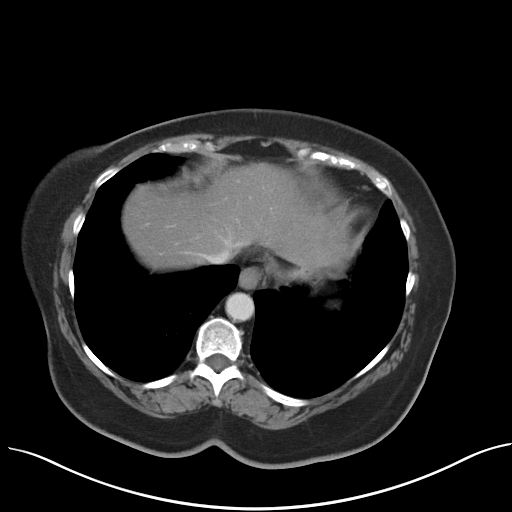

[Series 6: cor routine with · coronal · 0.82mm/px · 3 of 150 slices shown]
[im 50/150  soft-tissue]
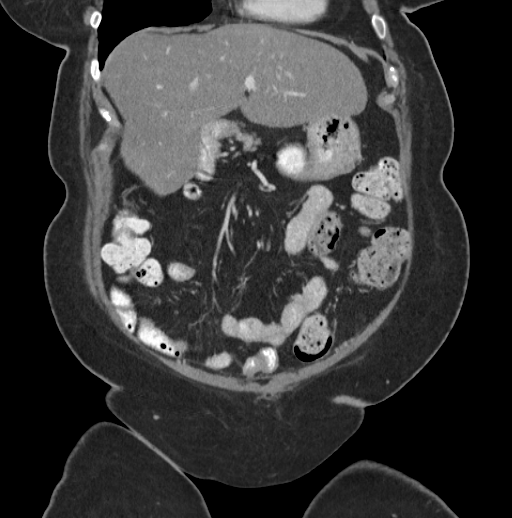
[im 67/150  soft-tissue]
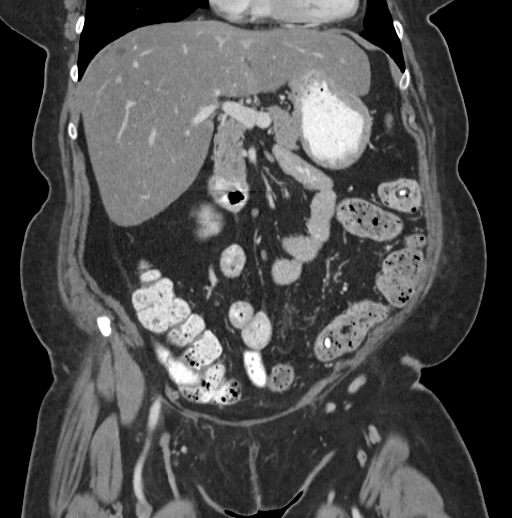
[im 83/150  soft-tissue]
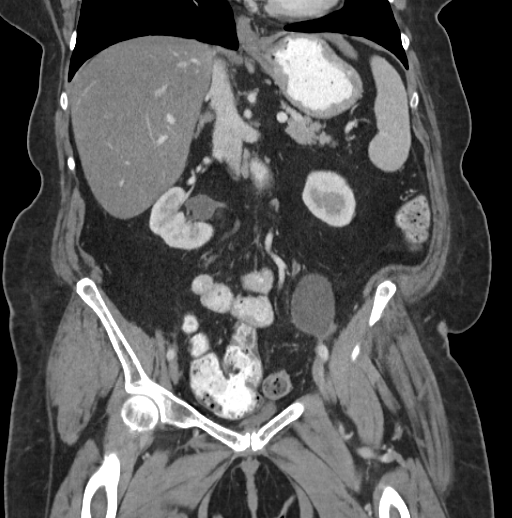

[17 of 46 positions shown; findings below may reference images not displayed]

FINDINGS: Lung bases show no acute findings. Heart size normal. No pericardial
or pleural effusion. Tiny lymph nodes are seen adjacent to the
distal esophagus.

Low-attenuation lesions in the liver measure up to 1.7 x 3.0 cm in
the hepatic dome, stable or smaller when compared with baseline
examination of 09/09/2006. Liver appears decreased in attenuation
diffusely. Cholecystectomy. Calcifications are seen in the lateral
limb right adrenal gland, as before. Adrenal glands, kidneys,
spleen, pancreas, stomach and small bowel are unremarkable. A fair
amount stool is seen in the colon.

Bladder is rather decompressed. A low-attenuation lesion in the
right adnexa measures 4.5 x 5.9 cm, new. Hysterectomy. A 3.2 x
cm low-attenuation lesion in the left adnexa is stable or slightly
smaller in size (previously 3.4 x 4.4 cm). No pathologically
enlarged lymph nodes. No free fluid. Minimal scattered
atherosclerotic calcification of the arterial vasculature without
abdominal aortic aneurysm. Mild laxity of the ventral abdominal
wall, at the umbilicus, without a discrete hernia. No worrisome
lytic or sclerotic lesions. Degenerative changes are seen in the
spine.
IMPRESSION: 1. Low-attenuation lesion in the right adnexa is new.
Low-attenuation lesion along the left adnexa is stable or slightly
smaller. Findings may represent postoperative seromas. No evidence
of omental caking or adenopathy.
2. Hepatic steatosis.
3. Fair amount of stool in the colon suggests constipation.

## 2016-01-21 ENCOUNTER — Other Ambulatory Visit: Payer: Self-pay

## 2016-01-21 DIAGNOSIS — Z8543 Personal history of malignant neoplasm of ovary: Secondary | ICD-10-CM

## 2016-01-24 ENCOUNTER — Inpatient Hospital Stay (HOSPITAL_BASED_OUTPATIENT_CLINIC_OR_DEPARTMENT_OTHER): Payer: Medicare Other | Admitting: Internal Medicine

## 2016-01-24 ENCOUNTER — Inpatient Hospital Stay: Payer: Medicare Other | Attending: Internal Medicine

## 2016-01-24 ENCOUNTER — Other Ambulatory Visit: Payer: Self-pay

## 2016-01-24 ENCOUNTER — Encounter: Payer: Self-pay | Admitting: Internal Medicine

## 2016-01-24 DIAGNOSIS — Z8543 Personal history of malignant neoplasm of ovary: Secondary | ICD-10-CM | POA: Insufficient documentation

## 2016-01-24 DIAGNOSIS — Z8041 Family history of malignant neoplasm of ovary: Secondary | ICD-10-CM | POA: Diagnosis not present

## 2016-01-24 DIAGNOSIS — K589 Irritable bowel syndrome without diarrhea: Secondary | ICD-10-CM | POA: Diagnosis not present

## 2016-01-24 DIAGNOSIS — Z8049 Family history of malignant neoplasm of other genital organs: Secondary | ICD-10-CM

## 2016-01-24 DIAGNOSIS — Z79899 Other long term (current) drug therapy: Secondary | ICD-10-CM | POA: Insufficient documentation

## 2016-01-24 DIAGNOSIS — Z90710 Acquired absence of both cervix and uterus: Secondary | ICD-10-CM | POA: Diagnosis not present

## 2016-01-24 DIAGNOSIS — Z9221 Personal history of antineoplastic chemotherapy: Secondary | ICD-10-CM

## 2016-01-24 DIAGNOSIS — I890 Lymphedema, not elsewhere classified: Secondary | ICD-10-CM

## 2016-01-24 DIAGNOSIS — Z801 Family history of malignant neoplasm of trachea, bronchus and lung: Secondary | ICD-10-CM | POA: Diagnosis not present

## 2016-01-24 DIAGNOSIS — K2270 Barrett's esophagus without dysplasia: Secondary | ICD-10-CM | POA: Diagnosis not present

## 2016-01-24 DIAGNOSIS — Z90722 Acquired absence of ovaries, bilateral: Secondary | ICD-10-CM | POA: Insufficient documentation

## 2016-01-24 DIAGNOSIS — Z8042 Family history of malignant neoplasm of prostate: Secondary | ICD-10-CM | POA: Insufficient documentation

## 2016-01-24 DIAGNOSIS — K219 Gastro-esophageal reflux disease without esophagitis: Secondary | ICD-10-CM

## 2016-01-24 DIAGNOSIS — J45909 Unspecified asthma, uncomplicated: Secondary | ICD-10-CM | POA: Insufficient documentation

## 2016-01-24 DIAGNOSIS — Z803 Family history of malignant neoplasm of breast: Secondary | ICD-10-CM

## 2016-01-24 DIAGNOSIS — I10 Essential (primary) hypertension: Secondary | ICD-10-CM

## 2016-01-24 DIAGNOSIS — C561 Malignant neoplasm of right ovary: Secondary | ICD-10-CM | POA: Insufficient documentation

## 2016-01-24 DIAGNOSIS — E039 Hypothyroidism, unspecified: Secondary | ICD-10-CM | POA: Insufficient documentation

## 2016-01-24 DIAGNOSIS — Z86010 Personal history of colonic polyps: Secondary | ICD-10-CM

## 2016-01-24 LAB — CBC WITH DIFFERENTIAL/PLATELET
BASOS PCT: 1 %
Basophils Absolute: 0 10*3/uL (ref 0–0.1)
EOS ABS: 0.2 10*3/uL (ref 0–0.7)
Eosinophils Relative: 3 %
HCT: 40.3 % (ref 35.0–47.0)
HEMOGLOBIN: 14.1 g/dL (ref 12.0–16.0)
Lymphocytes Relative: 24 %
Lymphs Abs: 1.6 10*3/uL (ref 1.0–3.6)
MCH: 30.4 pg (ref 26.0–34.0)
MCHC: 34.9 g/dL (ref 32.0–36.0)
MCV: 86.9 fL (ref 80.0–100.0)
MONO ABS: 0.3 10*3/uL (ref 0.2–0.9)
MONOS PCT: 5 %
NEUTROS PCT: 67 %
Neutro Abs: 4.5 10*3/uL (ref 1.4–6.5)
Platelets: 146 10*3/uL — ABNORMAL LOW (ref 150–440)
RBC: 4.64 MIL/uL (ref 3.80–5.20)
RDW: 14.6 % — ABNORMAL HIGH (ref 11.5–14.5)
WBC: 6.7 10*3/uL (ref 3.6–11.0)

## 2016-01-24 LAB — COMPREHENSIVE METABOLIC PANEL
ALK PHOS: 82 U/L (ref 38–126)
ALT: 54 U/L (ref 14–54)
AST: 51 U/L — ABNORMAL HIGH (ref 15–41)
Albumin: 4.1 g/dL (ref 3.5–5.0)
Anion gap: 9 (ref 5–15)
BUN: 15 mg/dL (ref 6–20)
CALCIUM: 9.7 mg/dL (ref 8.9–10.3)
CO2: 30 mmol/L (ref 22–32)
CREATININE: 0.94 mg/dL (ref 0.44–1.00)
Chloride: 98 mmol/L — ABNORMAL LOW (ref 101–111)
GFR, EST NON AFRICAN AMERICAN: 58 mL/min — AB (ref >60)
Glucose, Bld: 151 mg/dL — ABNORMAL HIGH (ref 65–99)
Potassium: 3.7 mmol/L (ref 3.5–5.1)
Sodium: 137 mmol/L (ref 135–145)
Total Bilirubin: 0.8 mg/dL (ref 0.3–1.2)
Total Protein: 7.1 g/dL (ref 6.5–8.1)

## 2016-01-24 NOTE — Assessment & Plan Note (Addendum)
#   STAGE II right-sided Ovarian cancer status post TAH/BSO followed by adjuvant chemotherapy. Clinically NED. Follow up CA125. If elevated imaging.   # Follow-up in 6 months/ patient also follow-up with GYN oncology.

## 2016-01-24 NOTE — Progress Notes (Signed)
[facility] OFFICE PROGRESS NOTE  Patient Care Team: Mark F Miller, MD as PCP - General (Internal Medicine)  No matching staging information was found for the patient.  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 [Dr.Secord]  # BRCA 1& 2- NEG      Ovarian cancer, right (HCC)   01/24/2016 Initial Diagnosis    Ovarian cancer, right (HCC)      Oncology History   #  2014  RIGHT ovarian cancer stage II s/p TAH & BSO; carbo-abraxane x 6 months. [Dr.Secord]  # BRCA 1& 2- NEG      Ovarian cancer, right (HCC)   01/24/2016 Initial Diagnosis    Ovarian cancer, right (HCC)       This is my first interaction with the patient as patient's primary oncologist has been Dr.Choksi. I reviewed the patient's prior charts/pertinent labs/imaging in detail; findings are summarized above.     INTERVAL HISTORY:  Leah Santos 75 y.o.  female pleasant patient above history of Ovarian cancer stage II surgery followed by adjuvant chemotherapy is here for follow-up.  Patient denies any abdominal pain. Denies any abdominal distention. No nausea or vomiting. No constipation.  REVIEW OF SYSTEMS:  A complete 10 point review of system is done which is negative except mentioned above/history of present illness.   PAST MEDICAL HISTORY :  Past Medical History:  Diagnosis Date  . Asthma   . Barrett's esophagus   . Cellulitis    Right lower extremity  . Chronic acquired lymphedema    since childhood  . GERD (gastroesophageal reflux disease)   . History of cellulitis   . History of chemotherapy    finished total 6 cycles of chemotherapy(initially with carboplatinum Taxol followed by carboplatinum and gemcitabine(February, 2015)  . History of colon polyps   . History of colonoscopy with polypectomy 11/07/13  . History of genital warts 1986  . History of mammogram 04/29/2014  . HTN (hypertension)   . Hypothyroidism   . IBS  (irritable bowel syndrome)   . Ovarian cancer on left (HCC)    No BRCA mutation-Left ovarian cancer, adenocarcinoma.pT2 pNO  M0     PAST SURGICAL HISTORY :   Past Surgical History:  Procedure Laterality Date  . BREAST BIOPSY Right 1994   neg  . BREAST LUMPECTOMY  1962  . CHOLECYSTECTOMY  1996  . COLONOSCOPY W/ POLYPECTOMY  11/07/13  . ESOPHAGOGASTRODUODENOSCOPY ENDOSCOPY  11/07/13  . Exp. Laparotomy, TAH, Salpingo-oophorectomy, Appendectomy  12/2012  . HEMORRHOID SURGERY  1963  . TUBAL LIGATION      FAMILY HISTORY :   Family History  Problem Relation Age of Onset  . Ovarian cancer Mother 62  . Breast cancer Maternal Aunt   . Breast cancer Maternal Aunt 70  . Lung cancer Maternal Uncle 75  . Prostate cancer Maternal Uncle 65  . Uterine cancer Maternal Aunt 41    SOCIAL HISTORY:   Social History  Substance Use Topics  . Smoking status: Never Smoker  . Smokeless tobacco: Never Used  . Alcohol use No    ALLERGIES:  is allergic to statins; taxol [paclitaxel]; sulfa antibiotics; and tape.  MEDICATIONS:  Current Outpatient Prescriptions  Medication Sig Dispense Refill  . acetaminophen (TYLENOL) 325 MG tablet Take 650 mg by mouth every 6 (six) hours as needed for mild pain. Reported on 10/24/2015    . Cholecalciferol (VITAMIN D3) 1000 UNITS CAPS Take   1 tablet by mouth daily.    Marland Kitchen ibuprofen (ADVIL,MOTRIN) 600 MG tablet Take 1 tablet (600 mg total) by mouth every 6 (six) hours as needed. 30 tablet 0  . lansoprazole (PREVACID) 30 MG capsule Take by mouth.    . levothyroxine (SYNTHROID, LEVOTHROID) 50 MCG tablet Take 50 mcg by mouth daily before breakfast.    . Multiple Vitamins-Minerals (CENTRUM SILVER ADULT 50+ PO) Take 1 tablet by mouth daily.    . potassium chloride (KLOR-CON 10) 10 MEQ tablet Take 3 tablets by mouth daily.    . sertraline (ZOLOFT) 100 MG tablet Take 1 tablet by mouth daily.    Marland Kitchen torsemide (DEMADEX) 20 MG tablet Take 20 mg by mouth daily. Reported on 10/24/2015     . triamterene-hydrochlorothiazide (MAXZIDE-25) 37.5-25 MG per tablet Take 1 tablet by mouth daily.    . mometasone (NASONEX) 50 MCG/ACT nasal spray Place 2 sprays into the nose daily.     No current facility-administered medications for this visit.    Facility-Administered Medications Ordered in Other Visits  Medication Dose Route Frequency Provider Last Rate Last Dose  . heparin lock flush 100 unit/mL  500 Units Intravenous Once Forest Gleason, MD      . sodium chloride 0.9 % injection 10 mL  10 mL Intravenous PRN Forest Gleason, MD   10 mL at 11/21/14 1108  . sodium chloride 0.9 % injection 10 mL  10 mL Intravenous PRN Forest Gleason, MD   10 mL at 03/21/15 1044    PHYSICAL EXAMINATION: ECOG PERFORMANCE STATUS: 0 - Asymptomatic  BP 133/79 (BP Location: Right Arm, Patient Position: Sitting)   Pulse 73   Temp 97.2 F (36.2 C) (Tympanic)   Resp 18   Ht 5' 3" (1.6 m)   Wt 185 lb 9.6 oz (84.2 kg)   BMI 32.88 kg/m   Filed Weights   01/24/16 1124  Weight: 185 lb 9.6 oz (84.2 kg)    GENERAL: Well-nourished well-developed; Alert, no distress and comfortable.   Alone. EYES: no pallor or icterus OROPHARYNX: no thrush or ulceration; good dentition  NECK: supple, no masses felt LYMPH:  no palpable lymphadenopathy in the cervical, axillary or inguinal regions LUNGS: clear to auscultation and  No wheeze or crackles HEART/CVS: regular rate & rhythm and no murmurs; No lower extremity edema ABDOMEN:abdomen soft, non-tender and normal bowel sounds Musculoskeletal:no cyanosis of digits and no clubbing  PSYCH: alert & oriented x 3 with fluent speech NEURO: no focal motor/sensory deficits SKIN:  no rashes or significant lesions  LABORATORY DATA:  I have reviewed the data as listed    Component Value Date/Time   NA 137 01/24/2016 1100   NA 140 05/24/2014 1034   K 3.7 01/24/2016 1100   K 3.3 (L) 05/24/2014 1034   CL 98 (L) 01/24/2016 1100   CL 97 (L) 05/24/2014 1034   CO2 30 01/24/2016  1100   CO2 30 05/24/2014 1034   GLUCOSE 151 (H) 01/24/2016 1100   GLUCOSE 152 (H) 05/24/2014 1034   BUN 15 01/24/2016 1100   BUN 19 (H) 05/24/2014 1034   CREATININE 0.94 01/24/2016 1100   CREATININE 1.55 (H) 05/24/2014 1034   CALCIUM 9.7 01/24/2016 1100   CALCIUM 9.6 05/24/2014 1034   PROT 7.1 01/24/2016 1100   PROT 7.6 05/24/2014 1034   ALBUMIN 4.1 01/24/2016 1100   ALBUMIN 3.8 05/24/2014 1034   AST 51 (H) 01/24/2016 1100   AST 30 05/24/2014 1034   ALT 54 01/24/2016 1100   ALT  55 05/24/2014 1034   ALKPHOS 82 01/24/2016 1100   ALKPHOS 98 05/24/2014 1034   BILITOT 0.8 01/24/2016 1100   BILITOT 0.5 05/24/2014 1034   GFRNONAA 58 (L) 01/24/2016 1100   GFRNONAA 35 (L) 05/24/2014 1034   GFRNONAA 60 (L) 11/08/2013 1115   GFRAA >60 01/24/2016 1100   GFRAA 42 (L) 05/24/2014 1034   GFRAA >60 11/08/2013 1115    No results found for: SPEP, UPEP  Lab Results  Component Value Date   WBC 6.7 01/24/2016   NEUTROABS 4.5 01/24/2016   HGB 14.1 01/24/2016   HCT 40.3 01/24/2016   MCV 86.9 01/24/2016   PLT 146 (L) 01/24/2016      Chemistry      Component Value Date/Time   NA 137 01/24/2016 1100   NA 140 05/24/2014 1034   K 3.7 01/24/2016 1100   K 3.3 (L) 05/24/2014 1034   CL 98 (L) 01/24/2016 1100   CL 97 (L) 05/24/2014 1034   CO2 30 01/24/2016 1100   CO2 30 05/24/2014 1034   BUN 15 01/24/2016 1100   BUN 19 (H) 05/24/2014 1034   CREATININE 0.94 01/24/2016 1100   CREATININE 1.55 (H) 05/24/2014 1034      Component Value Date/Time   CALCIUM 9.7 01/24/2016 1100   CALCIUM 9.6 05/24/2014 1034   ALKPHOS 82 01/24/2016 1100   ALKPHOS 98 05/24/2014 1034   AST 51 (H) 01/24/2016 1100   AST 30 05/24/2014 1034   ALT 54 01/24/2016 1100   ALT 55 05/24/2014 1034   BILITOT 0.8 01/24/2016 1100   BILITOT 0.5 05/24/2014 1034       RADIOGRAPHIC STUDIES: I have personally reviewed the radiological images as listed and agreed with the findings in the report. No results found.    ASSESSMENT & PLAN:  Ovarian cancer, right (HCC) # STAGE II right-sided Ovarian cancer status post TAH/BSO followed by adjuvant chemotherapy. Clinically NED. Follow up CA125. If elevated imaging.   # Follow-up in 6 months/ patient also follow-up with GYN oncology.    Orders Placed This Encounter  Procedures  . CBC with Differential    Standing Status:   Future    Standing Expiration Date:   01/23/2017  . Comprehensive metabolic panel    Standing Status:   Future    Standing Expiration Date:   01/23/2017  . CA 125    Standing Status:   Future    Standing Expiration Date:   01/23/2017   All questions were answered. The patient knows to call the clinic with any problems, questions or concerns.      Govinda R Brahmanday, MD 01/25/2016 7:47 AM  

## 2016-01-24 NOTE — Progress Notes (Signed)
No major changes or concerns today

## 2016-01-25 LAB — CA 125: CA 125: 15.4 U/mL (ref 0.0–38.1)

## 2016-01-30 ENCOUNTER — Telehealth: Payer: Self-pay | Admitting: *Deleted

## 2016-01-30 NOTE — Telephone Encounter (Signed)
Patient called and left vm. Inquired if ca125 results back. I left a vm stating that I was returning her phone call as patient does not like her results to be left on her vm.

## 2016-01-31 NOTE — Telephone Encounter (Signed)
Contacted patient - reassured patient that ca 125 results were stable. She thanked me for calling her.

## 2016-05-07 ENCOUNTER — Inpatient Hospital Stay: Payer: Medicare Other

## 2016-05-21 ENCOUNTER — Inpatient Hospital Stay: Payer: Medicare Other

## 2016-05-21 ENCOUNTER — Inpatient Hospital Stay: Payer: Medicare Other | Attending: Obstetrics and Gynecology | Admitting: Obstetrics and Gynecology

## 2016-05-21 ENCOUNTER — Encounter: Payer: Self-pay | Admitting: Obstetrics and Gynecology

## 2016-05-21 VITALS — BP 136/80 | HR 70 | Temp 97.2°F | Resp 18 | Ht 63.0 in | Wt 184.5 lb

## 2016-05-21 DIAGNOSIS — B377 Candidal sepsis: Secondary | ICD-10-CM | POA: Diagnosis not present

## 2016-05-21 DIAGNOSIS — Z8543 Personal history of malignant neoplasm of ovary: Secondary | ICD-10-CM | POA: Insufficient documentation

## 2016-05-21 MED ORDER — NYSTATIN 100000 UNIT/GM EX POWD: Freq: Three times a day (TID) | CUTANEOUS | 0 refills | Status: DC | PRN

## 2016-05-21 NOTE — Progress Notes (Signed)
Gynecologic Oncology Interval Visit   Primary Care Provider: Mark F Miller, MD [address] [facility] [place], [place] [address] [phone]  Referring MD: Dr. Weaver-Lee (no longer here)  Mark F Miller, MD as PCP - General (Internal Medicine)  Govinda R Brahmanday, MD - Oncology  Chief Concern: Ovarian cancer surveillance  Subjective:  Leah Santos is a 75 y.o. female who is seen for ovarian cancer surveillance.   She was seen in the ER on 05/06/2015 for foot pain, diagnosed with a fracture of fifth metatarsal bone, left, closed, and treated with a ACE wrap and conservative measures. She is doing better and did not complain of foot pain.   She was seen by Dr. Brahmanday on 01/24/2016 with a negative exam and normal CA125.     Lab Results  Component Value Date   CA125 15.4 01/24/2016   Mammogram: 08/03/2015 BI-RADS CATEGORY  1: Negative.   Dr. Miller is doing her breast cancer screening and the patient will follow up with him.   She has been diagnosed with DM now and is on diet control. She is trying to lose weight.   See ROS for other complaints.    Gynecologic Oncologym History Leah Santos was diagnosed with left ovarian cancer, adenocarcinoma.pT2 pNO  M0, FIGOSTAGING II B. Status post bilateral oopherectomy, omentectomy, lymph node dissection on December 21, 2012.  -Started on carboplatinum and Taxol from October, 2014. -Allergic reaction to Taxol on 2nd treatment.  Taxol was discontinued and started on gemcitabine -Patient has finished total 6 cycles of chemotherapy(initially with carboplatinum Taxol followed by carboplatinum and gemcitabine(February, 2015).  Pelvic MRI 11/21/2013  FINDINGS: Previous hysterectomy noted. Simple appearing cyst in the right adnexa shows significant decrease in size since previous study, currently measuring 1.2 x 2.5 cm on image 23 of series 9 compared to 4.5 x 5.9 cm previously. This is  consistent with a resolving postoperative fluid collection such as a lymphocele or seroma. Another simple appearing cyst in the left adnexa measures 3.0 x 4.4 cm. This remains stable in size and appearance since prior exam, and is also suspicious for a postoperative fluid collection. No internal septations or solid mural nodules are visualized.  IMPRESSION: No acute findings or radiographic signs of pelvic metastatic disease.  Near complete resolution of right adnexal fluid collection, consistent with resolving postoperative lymphocele or seroma.  Stable simple appearing left adnexal cyst, most consistent with persistent postoperative lymphocele or seroma.   Genetic testing: No BRCA mutation.  Health care maintenance: Dr. Choksi ordered her mammograms and performed cinical breast exams last past year .   Problem List: Patient Active Problem List   Diagnosis Date Noted  . Ovarian cancer, right (HCC) 01/24/2016  . Ovarian cancer (HCC) 10/24/2015  . Avitaminosis D 02/21/2015  . Acid reflux 12/27/2014  . History of colon polyps 12/27/2014  . HLD (hyperlipidemia) 12/27/2014  . BP (high blood pressure) 12/27/2014  . Obstructive apnea 12/27/2014  . H/O ovarian cancer 02/16/2014  . Acquired hypothyroidism 02/16/2014    Past Medical History: Past Medical History:  Diagnosis Date  . Asthma   . Barrett's esophagus   . Cellulitis    Right lower extremity  . Chronic acquired lymphedema    since childhood  . GERD (gastroesophageal reflux disease)   . History of cellulitis   . History of chemotherapy    finished total 6 cycles of chemotherapy(initially with carboplatinum Taxol followed by carboplatinum and gemcitabine(February, 2015)  . History of colon polyps   .   History of colonoscopy with polypectomy 11/07/13  . History of genital warts 1986  . History of mammogram 04/29/2014  . HTN (hypertension)   . Hypothyroidism   . IBS (irritable bowel syndrome)   . Ovarian cancer on  left (HCC)    No BRCA mutation-Left ovarian cancer, adenocarcinoma.pT2 pNO  M0     Past Surgical History: Past Surgical History:  Procedure Laterality Date  . BREAST BIOPSY Right 1994   neg  . BREAST LUMPECTOMY  1962  . CHOLECYSTECTOMY  1996  . COLONOSCOPY W/ POLYPECTOMY  11/07/13  . ESOPHAGOGASTRODUODENOSCOPY ENDOSCOPY  11/07/13  . Exp. Laparotomy, TAH, Salpingo-oophorectomy, Appendectomy  12/2012  . HEMORRHOID SURGERY  1963  . TUBAL LIGATION      Past Gynecologic History:  As per HPI  OB History:  OB History  Gravida Para Term Preterm AB Living  3 3 3        SAB TAB Ectopic Multiple Live Births               # Outcome Date GA Lbr Len/2nd Weight Sex Delivery Anes PTL Lv  3 Term           2 Term           1 Term             Obstetric Comments  Onset of menses at age 12    Family History: Family History  Problem Relation Age of Onset  . Ovarian cancer Mother 62  . Breast cancer Maternal Aunt   . Breast cancer Maternal Aunt 70  . Lung cancer Maternal Uncle 75  . Prostate cancer Maternal Uncle 65  . Uterine cancer Maternal Aunt 41    Social History: Social History   Social History  . Marital status: Married    Spouse name: N/A  . Number of children: N/A  . Years of education: N/A   Occupational History  . Not on file.   Social History Main Topics  . Smoking status: Never Smoker  . Smokeless tobacco: Never Used  . Alcohol use No  . Drug use: No  . Sexual activity: Not on file   Other Topics Concern  . Not on file   Social History Narrative  . No narrative on file    Allergies: Allergies  Allergen Reactions  . Statins Other (See Comments)    Muscle cramps  . Taxol [Paclitaxel]     Allergic reaction to Taxol ON 2nd treatment.  Taxol was discontinued and started on gemcitabine   . Sulfa Antibiotics Rash  . Tape Rash    Skin irritation with prolonged use (bandages with latex)    Current Medications: Current Outpatient Prescriptions   Medication Sig Dispense Refill  . acetaminophen (TYLENOL) 325 MG tablet Take 650 mg by mouth every 6 (six) hours as needed for mild pain. Reported on 10/24/2015    . Cholecalciferol (VITAMIN D3) 1000 UNITS CAPS Take 1 tablet by mouth daily.    . lansoprazole (PREVACID) 30 MG capsule Take by mouth.    . levothyroxine (SYNTHROID, LEVOTHROID) 50 MCG tablet Take 50 mcg by mouth daily before breakfast.    . mometasone (NASONEX) 50 MCG/ACT nasal spray Place 2 sprays into the nose daily as needed.     . Multiple Vitamins-Minerals (CENTRUM SILVER ADULT 50+ PO) Take 1 tablet by mouth daily.    . potassium chloride (KLOR-CON 10) 10 MEQ tablet Take 3 tablets by mouth daily.    . sertraline (ZOLOFT) 100 MG tablet   Take 1 tablet by mouth daily.    . triamterene-hydrochlorothiazide (MAXZIDE-25) 37.5-25 MG per tablet Take 1 tablet by mouth daily.     No current facility-administered medications for this visit.    Facility-Administered Medications Ordered in Other Visits  Medication Dose Route Frequency Provider Last Rate Last Dose  . heparin lock flush 100 unit/mL  500 Units Intravenous Once Janak Choksi, MD      . sodium chloride 0.9 % injection 10 mL  10 mL Intravenous PRN Janak Choksi, MD   10 mL at 11/21/14 1108  . sodium chloride 0.9 % injection 10 mL  10 mL Intravenous PRN Janak Choksi, MD   10 mL at 03/21/15 1044    Review of Systems General: fatigue  HEENT: no complaints  Lungs: no complaints  Cardiac: no complaints  GI: bloating otherwise no complaints and this has been longstanding  GU: history of yeast infections that she self medicated with topical anti-fungals.   Musculoskeletal: no complaints  Extremities: no complaints  Skin: occasional yeast infections under the breast and groin that she treats with topical cream Dr. Miller gave her.   Neuro: no complaints  Endocrine: no complaints  Psych: no complaints                           Objective:  Physical Examination:  BP  136/80   Pulse 70   Temp 97.2 F (36.2 C) (Tympanic)   Resp 18   Ht 5' 3" (1.6 m)   Wt 184 lb 8.4 oz (83.7 kg)   BMI 32.69 kg/m    ECOG Performance Status: 0 - Asymptomatic  General appearance: alert, cooperative and appears stated age HEENT:PERRLA, extra ocular movement intact and sclera clear, anicteric Lymph node survey: non-palpable, axillary, inguinal, supraclavicular Cardiovascular: regular rate and rhythm Respiratory: normal air entry, lungs clear to auscultation Breast exam: Not done Abdomen: soft, non-tender, without masses or organomegaly, hernia at umbilicus and now extending superiorly and inferiorly at least 12 cm in length and well healed incision. No ascites Extremities: extremities normal, atraumatic, no cyanosis or edema Neurological exam reveals alert, oriented, normal speech, no focal findings or movement disorder noted.  Pelvic: exam chaperoned by nurse;  Vulva: normal appearing vulva with no masses, tenderness, the previously seen of white epithelium at the vaginal introitus that is not raised and wipes off easily; Vagina: normal vagina; Adnexa: surgically absent; Uterus: surgically absent, vaginal cuff well healed; Cervix: absent; Rectal: normal rectal, no masses    Lab Review Labs on site today: pending CA125  Radiologic Imaging: None    Assessment:  Leah Santos is a 75 y.o. female diagnosed with stage II   ovarian cancer. Clinically NED. Vulvar white area due to candidiasis, currently asymptomatic. Candidiasis, skin, controlled with topical therapy.  Body mass index is 32.69 kg/m. Incisional ventral hernia, asymptomatic.    Medical co-morbidities complicating care: HTN, obesity and prior abdominal surgery.  Plan:   Problem List Items Addressed This Visit    Candidiasis, skin    Visit Diagnoses    History of ovarian cancer    -  Primary      Clinically NED. Follow up CA125 which has been ordered today. If elevated imaging.   At this  point she is over 3 years out from completion of chemotherapy and without disease. Therefore we can extend her surveillance times to 6 months intervals. If she is NED in 5 years we will refer to gynecology for   assistance with surveillance.  Candidiasis most likely due to diabetes. Information provided and she will use topical agent provided by Dr. Sabra Heck and we also provided a prescription for Nystatin powder. She will check with Dr. Sabra Heck and make sure it is okay for her to use this medication. She can not remember the name of the topical agent Dr. Sabra Heck ordered so we can not put into the system.   Mammogram and breast cancer screening. completed by Dr. Sabra Heck.   We also discussed her weight and need for weight loss, stressed good nutrition, and exercise. She was given CARE pamphlet today.    Gillis Ends, MD    CC:  Rusty Aus, MD [facility] [address] [place], [place] [address] [phone]

## 2016-05-21 NOTE — Progress Notes (Signed)
  Oncology Nurse Navigator Documentation Chaperoned pelvic exam. Follow up with Dr. Theora Gianotti in one year. Navigator Location: [facility] (05/21/16 0900)   )Navigator Encounter Type: Follow-up Appt (05/21/16 0900)                     Patient Visit Type: GynOnc (05/21/16 0900)                              Time Spent with Patient: 15 (05/21/16 0900)

## 2016-05-21 NOTE — Progress Notes (Signed)
She is concerned about yeast infections in the future because she had one before

## 2016-05-21 NOTE — Patient Instructions (Signed)
Skin Yeast Infection Skin yeast infection is a condition in which there is an overgrowth of yeast (candida) that normally lives on the skin. This condition usually occurs in areas of the skin that are constantly warm and moist, such as the armpits or the groin. What are the causes? This condition is caused by a change in the normal balance of the yeast and bacteria that live on the skin. What increases the risk? This condition is more likely to develop in:  People who are obese.  Pregnant women.  Women who take birth control pills.  People who have diabetes.  People who take antibiotic medicines.  People who take steroid medicines.  People who are malnourished.  People who have a weak defense (immune) system.  People who are 75 years of age or older. What are the signs or symptoms? Symptoms of this condition include:  A red, swollen area of the skin.  Bumps on the skin.  Itchiness. How is this diagnosed? This condition is diagnosed with a medical history and physical exam. Your health care provider may check for yeast by taking light scrapings of the skin to be viewed under a microscope. How is this treated? This condition is treated with medicine. Medicines may be prescribed or be available over-the-counter. The medicines may be:  Taken by mouth (orally).  Applied as a cream. Follow these instructions at home:  Take or apply over-the-counter and prescription medicines only as told by your health care provider.  Eat more yogurt. This may help to keep your yeast infection from returning.  Maintain a healthy weight. If you need help losing weight, talk with your health care provider.  Keep your skin clean and dry.  If you have diabetes, keep your blood sugar under control. Contact a health care provider if:  Your symptoms go away and then return.  Your symptoms do not get better with treatment.  Your symptoms get worse.  Your rash spreads.  You have a fever  or chills.  You have new symptoms.  You have new warmth or redness of your skin. This information is not intended to replace advice given to you by your health care provider. Make sure you discuss any questions you have with your health care provider. Document Released: 12/24/2010 Document Revised: 12/02/2015 Document Reviewed: 10/09/2014 Elsevier Interactive Patient Education  2017 Elsevier Inc.   Nystatin topical powder What is this medicine? NYSTATIN (nye STAT in) is an antifungal medicine. It is used to treat certain kinds of fungal or yeast infections of the skin. This medicine may be used for other purposes; ask your health care provider or pharmacist if you have questions. COMMON BRAND NAME(S): Mycostatin, Nyamyc, Nyata, Nystop, Pedi-Dri What should I tell my health care provider before I take this medicine? They need to know if you have any of these conditions: -an unusual or allergic reaction to nystatin, other foods, dyes or preservatives -pregnant or trying to get pregnant -breast-feeding How should I use this medicine? This medicine is for external use on the skin only. Follow the directions on the prescription label. Dust the powder on the affected area (or into socks and shoes). If you are treating diaper rash, do not use tight-fitting diapers or plastic pants. Do not get the medicine in your eyes. If you do, rinse out with plenty of cool tap water. Do not breathe in the powder. Do not use your medicine more often than directed. Use your doses at regular intervals. Finish the full course prescribed  by your doctor or health care professional even if you think your condition is better. Do not stop using except on the advice of your doctor or health care professional. Talk to your pediatrician regarding the use of this medicine in children. Special care may be needed. Overdosage: If you think you have taken too much of this medicine contact a poison control center or emergency room  at once. NOTE: This medicine is only for you. Do not share this medicine with others. What if I miss a dose? If you miss a dose, use it as soon as you can. If it is almost time for your next dose, use only that dose. Do not use double or extra doses. What may interact with this medicine? Interactions are not expected. Do not use any other skin products on the affected area without telling your doctor or health care professional. This list may not describe all possible interactions. Give your health care provider a list of all the medicines, herbs, non-prescription drugs, or dietary supplements you use. Also tell them if you smoke, drink alcohol, or use illegal drugs. Some items may interact with your medicine. What should I watch for while using this medicine? Tell your doctor or health care professional if your symptoms do not improve after 3 days. After bathing make sure that your skin is very dry. Fungal infections like moist conditions. Do not walk around barefoot. To help prevent reinfection, wear freshly washed cotton, not synthetic, clothing. What side effects may I notice from receiving this medicine? Side effects that you should report to your doctor or health care professional as soon as possible: -allergic reactions like skin rash, itching or hives, swelling of the face, lips, or tongue Side effects that usually do not require medical attention (report to your doctor or health care professional if they continue or are bothersome): -skin irritation This list may not describe all possible side effects. Call your doctor for medical advice about side effects. You may report side effects to FDA at 1-800-FDA-1088. Where should I keep my medicine? Keep out of the reach of children. Store at room temperature between 15 and 30 degrees C (59 and 86 degrees F). Throw away any unused medicine after the expiration date. NOTE: This sheet is a summary. It may not cover all possible information. If  you have questions about this medicine, talk to your doctor, pharmacist, or health care provider.  2017 Elsevier/Gold Standard (2015-05-10 10:36:02)

## 2016-05-22 LAB — CA 125: CA 125: 15.9 U/mL (ref 0.0–38.1)

## 2016-05-23 ENCOUNTER — Telehealth: Payer: Self-pay

## 2016-05-23 NOTE — Telephone Encounter (Signed)
  Oncology Nurse Navigator Documentation Notified Ms. Deiss of normal CA-125 result of 15.9 Navigator Location: [facility] (05/23/16 1000)   )Navigator Encounter Type: Telephone;Diagnostic Results (05/23/16 1000)                                                    Time Spent with Patient: 15 (05/23/16 1000)

## 2016-07-24 ENCOUNTER — Ambulatory Visit: Payer: Medicare Other | Admitting: Internal Medicine

## 2016-07-24 ENCOUNTER — Other Ambulatory Visit: Payer: Medicare Other

## 2016-07-25 ENCOUNTER — Other Ambulatory Visit: Payer: Medicare Other

## 2016-07-25 ENCOUNTER — Ambulatory Visit: Payer: Medicare Other | Admitting: Internal Medicine

## 2016-07-31 ENCOUNTER — Inpatient Hospital Stay: Payer: Medicare Other | Admitting: Internal Medicine

## 2016-07-31 ENCOUNTER — Inpatient Hospital Stay: Payer: Medicare Other

## 2016-08-14 ENCOUNTER — Inpatient Hospital Stay: Payer: Medicare Other

## 2016-08-14 ENCOUNTER — Inpatient Hospital Stay: Payer: Medicare Other | Admitting: Internal Medicine

## 2016-08-20 ENCOUNTER — Inpatient Hospital Stay: Payer: Medicare Other | Attending: Internal Medicine

## 2016-08-20 ENCOUNTER — Inpatient Hospital Stay (HOSPITAL_BASED_OUTPATIENT_CLINIC_OR_DEPARTMENT_OTHER): Payer: Medicare Other | Admitting: Internal Medicine

## 2016-08-20 DIAGNOSIS — Z79899 Other long term (current) drug therapy: Secondary | ICD-10-CM | POA: Diagnosis not present

## 2016-08-20 DIAGNOSIS — Z803 Family history of malignant neoplasm of breast: Secondary | ICD-10-CM | POA: Diagnosis not present

## 2016-08-20 DIAGNOSIS — Z9221 Personal history of antineoplastic chemotherapy: Secondary | ICD-10-CM

## 2016-08-20 DIAGNOSIS — K219 Gastro-esophageal reflux disease without esophagitis: Secondary | ICD-10-CM

## 2016-08-20 DIAGNOSIS — Z90722 Acquired absence of ovaries, bilateral: Secondary | ICD-10-CM | POA: Insufficient documentation

## 2016-08-20 DIAGNOSIS — E876 Hypokalemia: Secondary | ICD-10-CM | POA: Insufficient documentation

## 2016-08-20 DIAGNOSIS — Z86010 Personal history of colonic polyps: Secondary | ICD-10-CM

## 2016-08-20 DIAGNOSIS — R7303 Prediabetes: Secondary | ICD-10-CM

## 2016-08-20 DIAGNOSIS — K589 Irritable bowel syndrome without diarrhea: Secondary | ICD-10-CM | POA: Diagnosis not present

## 2016-08-20 DIAGNOSIS — K2270 Barrett's esophagus without dysplasia: Secondary | ICD-10-CM | POA: Diagnosis not present

## 2016-08-20 DIAGNOSIS — J45909 Unspecified asthma, uncomplicated: Secondary | ICD-10-CM

## 2016-08-20 DIAGNOSIS — I890 Lymphedema, not elsewhere classified: Secondary | ICD-10-CM | POA: Insufficient documentation

## 2016-08-20 DIAGNOSIS — Z801 Family history of malignant neoplasm of trachea, bronchus and lung: Secondary | ICD-10-CM | POA: Diagnosis not present

## 2016-08-20 DIAGNOSIS — Z8543 Personal history of malignant neoplasm of ovary: Secondary | ICD-10-CM | POA: Insufficient documentation

## 2016-08-20 DIAGNOSIS — C561 Malignant neoplasm of right ovary: Secondary | ICD-10-CM

## 2016-08-20 DIAGNOSIS — Z90710 Acquired absence of both cervix and uterus: Secondary | ICD-10-CM | POA: Insufficient documentation

## 2016-08-20 DIAGNOSIS — I10 Essential (primary) hypertension: Secondary | ICD-10-CM | POA: Diagnosis not present

## 2016-08-20 DIAGNOSIS — E039 Hypothyroidism, unspecified: Secondary | ICD-10-CM

## 2016-08-20 DIAGNOSIS — Z8041 Family history of malignant neoplasm of ovary: Secondary | ICD-10-CM

## 2016-08-20 DIAGNOSIS — Z8042 Family history of malignant neoplasm of prostate: Secondary | ICD-10-CM

## 2016-08-20 LAB — CBC WITH DIFFERENTIAL/PLATELET
BASOS ABS: 0 10*3/uL (ref 0–0.1)
BASOS PCT: 1 %
EOS ABS: 0.2 10*3/uL (ref 0–0.7)
Eosinophils Relative: 3 %
HCT: 41.1 % (ref 35.0–47.0)
Hemoglobin: 14.4 g/dL (ref 12.0–16.0)
LYMPHS PCT: 19 %
Lymphs Abs: 1.4 10*3/uL (ref 1.0–3.6)
MCH: 30.3 pg (ref 26.0–34.0)
MCHC: 35.1 g/dL (ref 32.0–36.0)
MCV: 86.2 fL (ref 80.0–100.0)
Monocytes Absolute: 0.3 10*3/uL (ref 0.2–0.9)
Monocytes Relative: 5 %
Neutro Abs: 5.5 10*3/uL (ref 1.4–6.5)
Neutrophils Relative %: 72 %
PLATELETS: 162 10*3/uL (ref 150–440)
RBC: 4.77 MIL/uL (ref 3.80–5.20)
RDW: 14.8 % — ABNORMAL HIGH (ref 11.5–14.5)
WBC: 7.5 10*3/uL (ref 3.6–11.0)

## 2016-08-20 LAB — COMPREHENSIVE METABOLIC PANEL
ALBUMIN: 4 g/dL (ref 3.5–5.0)
ALT: 40 U/L (ref 14–54)
AST: 39 U/L (ref 15–41)
Alkaline Phosphatase: 78 U/L (ref 38–126)
Anion gap: 11 (ref 5–15)
BUN: 20 mg/dL (ref 6–20)
CHLORIDE: 99 mmol/L — AB (ref 101–111)
CO2: 25 mmol/L (ref 22–32)
CREATININE: 0.93 mg/dL (ref 0.44–1.00)
Calcium: 9.6 mg/dL (ref 8.9–10.3)
GFR calc Af Amer: >60 mL/min (ref >60)
GFR calc non Af Amer: 59 mL/min — ABNORMAL LOW (ref >60)
Glucose, Bld: 203 mg/dL — ABNORMAL HIGH (ref 65–99)
POTASSIUM: 3.1 mmol/L — AB (ref 3.5–5.1)
SODIUM: 135 mmol/L (ref 135–145)
Total Bilirubin: 0.8 mg/dL (ref 0.3–1.2)
Total Protein: 7.3 g/dL (ref 6.5–8.1)

## 2016-08-20 NOTE — Progress Notes (Signed)
Patient h/o barrett's esophagus - she states that she is due for an other upper endoscopy. She will contact [facility] to set up this procedure up.  Over all, her general health and wellbeing has improved but she states that she experiences chronic fatigue.  She would like to start back with exercise (walking) to improve chronic fatigue. Pt discussed the importance of improving her health-she states that her hgb al1c has been elevated. She will f/u with Dr. Emily Filbert in a few weeks to reevaluation her diabetes/hgba1c level.  RN discussed her currently level of stress with patient. She states that her husband was recently dx with bladder cancer and will be under going treatment for this. She states that this has increased her personal level of stress in the last few weeks.

## 2016-08-20 NOTE — Progress Notes (Signed)
[place] OFFICE PROGRESS NOTE  Patient Care Team: Rusty Aus, MD as PCP - General (Internal Medicine)  Cancer Staging No matching staging information was found for the patient.  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 [Dr.Secord]  # BRCA 1& 2- NEG      Ovarian cancer, right ([place])   01/24/2016 Initial Diagnosis    Ovarian cancer, right [facility])      Oncology History   #  2014  RIGHT ovarian cancer stage II s/p TAH & BSO; carbo-abraxane x 6 months. [Dr.Secord]  # BRCA 1& 2- NEG      Ovarian cancer, right ([place])   01/24/2016 Initial Diagnosis    Ovarian cancer, right ([place])       INTERVAL HISTORY:  Leah Santos 76 y.o.  female pleasant patient above history of Ovarian cancer stage II surgery followed by adjuvant chemotherapy is here for follow-up.  Patient is currently under a lot of stress secondary to her husband diagnosed with bladder cancer. Otherwise patient denies any abdominal pain. Denies any abdominal distention. No nausea or vomiting. No constipation. Denies any difficulty swallowing or pain with swallowing.  REVIEW OF SYSTEMS:  A complete 10 point review of system is done which is negative except mentioned above/history of present illness.   PAST MEDICAL HISTORY :  Past Medical History:  Diagnosis Date  . Asthma   . Barrett's esophagus   . Cellulitis    Right lower extremity  . Chronic acquired lymphedema    since childhood  . GERD (gastroesophageal reflux disease)   . History of cellulitis   . History of chemotherapy    finished total 6 cycles of chemotherapy(initially with carboplatinum Taxol followed by carboplatinum and gemcitabine(February, 2015)  . History of colon polyps   . History of colonoscopy with polypectomy 11/07/13  . History of genital warts 1986  . History of mammogram 04/29/2014  . HTN (hypertension)   . Hypothyroidism   . IBS (irritable bowel syndrome)    . Ovarian cancer on left ([facility])    No BRCA mutation-Left ovarian cancer, adenocarcinoma.pT2 pNO  M0     PAST SURGICAL HISTORY :   Past Surgical History:  Procedure Laterality Date  . BREAST BIOPSY Right 1994   neg  . BREAST LUMPECTOMY  1962  . CHOLECYSTECTOMY  1996  . COLONOSCOPY W/ POLYPECTOMY  11/07/13  . ESOPHAGOGASTRODUODENOSCOPY ENDOSCOPY  11/07/13  . Exp. Laparotomy, TAH, Salpingo-oophorectomy, Appendectomy  12/2012  . [place]  . TUBAL LIGATION      FAMILY HISTORY :   Family History  Problem Relation Age of Onset  . Ovarian cancer Mother 27  . Breast cancer Maternal Aunt   . Breast cancer Maternal Aunt 70  . Lung cancer Maternal Uncle 75  . Prostate cancer Maternal Uncle 65  . Uterine cancer Maternal Aunt 55    SOCIAL HISTORY:   Social History  Substance Use Topics  . Smoking status: Never Smoker  . Smokeless tobacco: Never Used  . Alcohol use No    ALLERGIES:  is allergic to statins; taxol [paclitaxel]; sulfa antibiotics; and tape.  MEDICATIONS:  Current Outpatient Prescriptions  Medication Sig Dispense Refill  . Cholecalciferol (VITAMIN D3) 1000 UNITS CAPS Take 1 tablet by mouth daily.    Marland Kitchen ibuprofen (ADVIL,MOTRIN) 200 MG tablet Take 200 mg by mouth every 6 (six) hours as needed.    . lansoprazole (PREVACID)  30 MG capsule Take 30 mg by mouth daily at 12 noon.     Marland Kitchen levothyroxine (SYNTHROID, LEVOTHROID) 50 MCG tablet Take 50 mcg by mouth daily before breakfast.    . mometasone (NASONEX) 50 MCG/ACT nasal spray Place 2 sprays into the nose daily as needed.     . Multiple Vitamins-Minerals (CENTRUM SILVER ADULT 50+ PO) Take 1 tablet by mouth daily.    . potassium chloride (KLOR-CON 10) 10 MEQ tablet Take 4 tablets by mouth daily.     . sertraline (ZOLOFT) 100 MG tablet Take 1 tablet by mouth daily.    Marland Kitchen triamterene-hydrochlorothiazide (MAXZIDE-25) 37.5-25 MG per tablet Take 1 tablet by mouth daily.     No current facility-administered medications  for this visit.    Facility-Administered Medications Ordered in Other Visits  Medication Dose Route Frequency Provider Last Rate Last Dose  . heparin lock flush 100 unit/mL  500 Units Intravenous Once Forest Gleason, MD      . sodium chloride 0.9 % injection 10 mL  10 mL Intravenous PRN Forest Gleason, MD   10 mL at 11/21/14 1108  . sodium chloride 0.9 % injection 10 mL  10 mL Intravenous PRN Forest Gleason, MD   10 mL at 03/21/15 1044    PHYSICAL EXAMINATION: ECOG PERFORMANCE STATUS: 0 - Asymptomatic  BP 130/86 (BP Location: Right Arm, Patient Position: Sitting)   Pulse 76   Temp 98.1 F (36.7 C) (Tympanic)   Resp 20   Ht _0  (1.6 m)   Wt 183 lb (83 kg)   BMI 32.42 kg/m   Filed Weights   08/20/16 0854  Weight: 183 lb (83 kg)    GENERAL: Well-nourished well-developed; Alert, no distress and comfortable.   Alone. EYES: no pallor or icterus OROPHARYNX: no thrush or ulceration; good dentition  NECK: supple, no masses felt LYMPH:  no palpable lymphadenopathy in the cervical, axillary or inguinal regions LUNGS: clear to auscultation and  No wheeze or crackles HEART/CVS: regular rate & rhythm and no murmurs; No lower extremity edema ABDOMEN:abdomen soft, non-tender and normal bowel sounds Musculoskeletal:no cyanosis of digits and no clubbing  PSYCH: alert & oriented x 3 with fluent speech NEURO: no focal motor/sensory deficits SKIN:  no rashes or significant lesions  LABORATORY DATA:  I have reviewed the data as listed    Component Value Date/Time   NA 135 08/20/2016 0808   NA 140 05/24/2014 1034   K 3.1 (L) 08/20/2016 0808   K 3.3 (L) 05/24/2014 1034   CL 99 (L) 08/20/2016 0808   CL 97 (L) 05/24/2014 1034   CO2 25 08/20/2016 0808   CO2 30 05/24/2014 1034   GLUCOSE 203 (H) 08/20/2016 0808   GLUCOSE 152 (H) 05/24/2014 1034   BUN 20 08/20/2016 0808   BUN 19 (H) 05/24/2014 1034   CREATININE 0.93 08/20/2016 0808   CREATININE 1.55 (H) 05/24/2014 1034   CALCIUM 9.6  08/20/2016 0808   CALCIUM 9.6 05/24/2014 1034   PROT 7.3 08/20/2016 0808   PROT 7.6 05/24/2014 1034   ALBUMIN 4.0 08/20/2016 0808   ALBUMIN 3.8 05/24/2014 1034   AST 39 08/20/2016 0808   AST 30 05/24/2014 1034   ALT 40 08/20/2016 0808   ALT 55 05/24/2014 1034   ALKPHOS 78 08/20/2016 0808   ALKPHOS 98 05/24/2014 1034   BILITOT 0.8 08/20/2016 0808   BILITOT 0.5 05/24/2014 1034   GFRNONAA 59 (L) 08/20/2016 0808   GFRNONAA 35 (L) 05/24/2014 1034   GFRNONAA 60 (L)  11/08/2013 1115   GFRAA >60 08/20/2016 0808   GFRAA 42 (L) 05/24/2014 1034   GFRAA >60 11/08/2013 1115    No results found for: SPEP, UPEP  Lab Results  Component Value Date   WBC 7.5 08/20/2016   NEUTROABS 5.5 08/20/2016   HGB 14.4 08/20/2016   HCT 41.1 08/20/2016   MCV 86.2 08/20/2016   PLT 162 08/20/2016      Chemistry      Component Value Date/Time   NA 135 08/20/2016 0808   NA 140 05/24/2014 1034   K 3.1 (L) 08/20/2016 0808   K 3.3 (L) 05/24/2014 1034   CL 99 (L) 08/20/2016 0808   CL 97 (L) 05/24/2014 1034   CO2 25 08/20/2016 0808   CO2 30 05/24/2014 1034   BUN 20 08/20/2016 0808   BUN 19 (H) 05/24/2014 1034   CREATININE 0.93 08/20/2016 0808   CREATININE 1.55 (H) 05/24/2014 1034      Component Value Date/Time   CALCIUM 9.6 08/20/2016 0808   CALCIUM 9.6 05/24/2014 1034   ALKPHOS 78 08/20/2016 0808   ALKPHOS 98 05/24/2014 1034   AST 39 08/20/2016 0808   AST 30 05/24/2014 1034   ALT 40 08/20/2016 0808   ALT 55 05/24/2014 1034   BILITOT 0.8 08/20/2016 0808   BILITOT 0.5 05/24/2014 1034       RADIOGRAPHIC STUDIES: I have personally reviewed the radiological images as listed and agreed with the findings in the report. No results found.   ASSESSMENT & PLAN:  Ovarian cancer, right ([place]) # STAGE II right-sided Ovarian cancer status post TAH/BSO followed by adjuvant chemotherapy. Clinically NED. Follow up CA125. If elevated imaging.   # Hx Barrett's- awaiting EGD; due this year.   # Mild  hypokalemia- 3.1; on diuretic; on K supp.   # Elevated blood sugars- 203-[pre-diabetic] recommend fu with PCP.   # Follow-up in Jan 2019 months [sees Dr.Secord in Oct 2017]. With labs.   Orders Placed This Encounter  Procedures  . CBC with Differential    Standing Status:   Future    Standing Expiration Date:   08/20/2017  . Comprehensive metabolic panel    Standing Status:   Future    Standing Expiration Date:   08/20/2017  . CA 125    Standing Status:   Future    Standing Expiration Date:   08/20/2017   All questions were answered. The patient knows to call the clinic with any problems, questions or concerns.      Cammie Sickle, MD 08/20/2016 1:44 PM

## 2016-08-20 NOTE — Assessment & Plan Note (Signed)
#   STAGE II right-sided Ovarian cancer status post TAH/BSO followed by adjuvant chemotherapy. Clinically NED. Follow up CA125. If elevated imaging.   # Hx Barrett's- awaiting EGD; due this year.   # Mild hypokalemia- 3.1; on diuretic; on K supp.   # Elevated blood sugars- 203-[pre-diabetic] recommend fu with PCP.   # Follow-up in Jan 2019 months [sees Dr.Secord in Oct 2017]. With labs.

## 2016-08-21 LAB — CA 125: CA 125: 15.6 U/mL (ref 0.0–38.1)

## 2016-09-08 ENCOUNTER — Telehealth: Payer: Self-pay

## 2016-09-08 NOTE — Telephone Encounter (Signed)
Pt wants to get test results from CA125.  Please call.

## 2016-09-16 ENCOUNTER — Encounter: Payer: Self-pay | Admitting: *Deleted

## 2016-09-16 NOTE — Telephone Encounter (Signed)
msg received by RN on 09/16/16. Left vm for patient that I returned her phone call. Will also send a mychart msg to patient regarding her results.

## 2017-02-18 ENCOUNTER — Inpatient Hospital Stay: Payer: Medicare Other

## 2017-03-04 ENCOUNTER — Ambulatory Visit: Payer: Medicare Other

## 2017-03-09 NOTE — Progress Notes (Signed)
Gynecologic Oncology Interval Visit   Primary Care Provider: Rusty Aus, MD [facility] [place], [place] [address] [phone]  Referring MD: Dr. Ferne Reus (no longer here)  Rusty Aus, MD as PCP - General (Internal Medicine)  Cammie Sickle, MD - Oncology  Chief Concern: Ovarian cancer surveillance  Subjective:  Leah Santos is a 76 y.o. female who is seen for ovarian cancer surveillance.    She was seen by Dr. Rogue Bussing on 08/20/2017 with a negative exam and normal CA125. She has several complaints today that are worrisome for recurrence vs persistent IBS with predominant diarrhea component - see ROS.     Lab Results  Component Value Date   CA125 15.6 08/20/2016    Mammogram: 08/03/2015 BI-RADS CATEGORY  1: Negative.   Dr. Sabra Heck is doing her breast cancer screening and the patient will follow up with him.   She has been diagnosed with DM now and is on diet control. She is trying to lose weight.   See ROS for other complaints.    Gynecologic Oncologym History Leah Santos was diagnosed with left ovarian cancer, adenocarcinoma.pT2 pNO  M0, FIGOSTAGING II B. Status post bilateral oopherectomy, omentectomy, lymph node dissection on December 21, 2012.  -Started on carboplatinum and Taxol from October, 2014. -Allergic reaction to Taxol on 2nd treatment.  Taxol was discontinued and started on gemcitabine -Patient has finished total 6 cycles of chemotherapy(initially with carboplatinum Taxol followed by carboplatinum and gemcitabine(February, 2015).  Pelvic MRI 11/21/2013  FINDINGS: Previous hysterectomy noted. Simple appearing cyst in the right adnexa shows significant decrease in size since previous study, currently measuring 1.2 x 2.5 cm on image 23 of series 9 compared to 4.5 x 5.9 cm previously. This is consistent with a resolving postoperative fluid collection such as a lymphocele or seroma. Another  simple appearing cyst in the left adnexa measures 3.0 x 4.4 cm. This remains stable in size and appearance since prior exam, and is also suspicious for a postoperative fluid collection. No internal septations or solid mural nodules are visualized.  IMPRESSION: No acute findings or radiographic signs of pelvic metastatic disease.  Near complete resolution of right adnexal fluid collection, consistent with resolving postoperative lymphocele or seroma.  Stable simple appearing left adnexal cyst, most consistent with persistent postoperative lymphocele or seroma.   Genetic testing: No BRCA mutation.  Health care maintenance: Dr. Oliva Bustard ordered her mammograms and performed cinical breast exams last past year .   Problem List: Patient Active Problem List   Diagnosis Date Noted  . Ovarian cancer, right ([place]) 01/24/2016  . Ovarian cancer ([place]) 10/24/2015  . Avitaminosis D 02/21/2015  . Acid reflux 12/27/2014  . History of colon polyps 12/27/2014  . HLD (hyperlipidemia) 12/27/2014  . BP (high blood pressure) 12/27/2014  . Obstructive apnea 12/27/2014  . H/O ovarian cancer 02/16/2014  . Acquired hypothyroidism 02/16/2014    Past Medical History: Past Medical History:  Diagnosis Date  . Asthma   . Barrett's esophagus   . Cellulitis    Right lower extremity  . Chronic acquired lymphedema    since childhood  . GERD (gastroesophageal reflux disease)   . History of cellulitis   . History of chemotherapy    finished total 6 cycles of chemotherapy(initially with carboplatinum Taxol followed by carboplatinum and gemcitabine(February, 2015)  . History of colon polyps   . History of colonoscopy with polypectomy 11/07/13  . History of genital warts 1986  . History of mammogram  04/29/2014  . HTN (hypertension)   . Hypothyroidism   . IBS (irritable bowel syndrome)   . Ovarian cancer on left ([facility])    No BRCA mutation-Left ovarian cancer, adenocarcinoma.pT2 pNO  M0     Past Surgical  History: Past Surgical History:  Procedure Laterality Date  . BREAST BIOPSY Right 1994   neg  . BREAST LUMPECTOMY  1962  . CHOLECYSTECTOMY  1996  . COLONOSCOPY W/ POLYPECTOMY  11/07/13  . ESOPHAGOGASTRODUODENOSCOPY ENDOSCOPY  11/07/13  . Exp. Laparotomy, TAH, Salpingo-oophorectomy, Appendectomy  12/2012  . [place]  . TUBAL LIGATION      Past Gynecologic History:  As per HPI  OB History:  OB History  Gravida Para Term Preterm AB Living  _0 SAB TAB Ectopic Multiple Live Births               # Outcome Date GA Lbr Len/2nd Weight Sex Delivery Anes PTL Lv  3 Term           2 Term           1 Term             Obstetric Comments  Onset of menses at age 46    Family History: Family History  Problem Relation Age of Onset  . Ovarian cancer Mother 81  . Breast cancer Maternal Aunt   . Breast cancer Maternal Aunt 70  . Lung cancer Maternal Uncle 75  . Prostate cancer Maternal Uncle 65  . Uterine cancer Maternal Aunt 41    Social History: Social History   Socioeconomic History  . Marital status: Married    Spouse name: Not on file  . Number of children: Not on file  . Years of education: Not on file  . Highest education level: Not on file  Social Needs  . Financial resource strain: Not on file  . Food insecurity - worry: Not on file  . Food insecurity - inability: Not on file  . Transportation needs - medical: Not on file  . Transportation needs - non-medical: Not on file  Occupational History  . Not on file  Tobacco Use  . Smoking status: Never Smoker  . Smokeless tobacco: Never Used  Substance and Sexual Activity  . Alcohol use: No    Alcohol/week: 0.0 oz  . Drug use: No  . Sexual activity: Not on file  Other Topics Concern  . Not on file  Social History Narrative  . Not on file    Allergies: Allergies  Allergen Reactions  . Statins Other (See Comments)    Muscle cramps  . Taxol [Paclitaxel]     Allergic reaction to Taxol ON  2nd treatment.  Taxol was discontinued and started on gemcitabine   . Sulfa Antibiotics Rash  . Tape Rash    Skin irritation with prolonged use (bandages with latex)    Current Medications: Current Outpatient Medications  Medication Sig Dispense Refill  . albuterol (PROVENTIL HFA) 108 (90 Base) MCG/ACT inhaler Inhale 2 puffs into the lungs every 4 (four) hours as needed.    . Cholecalciferol (VITAMIN D3) 1000 UNITS CAPS Take 1 tablet by mouth daily.    . fluticasone (FLONASE) 50 MCG/ACT nasal spray Place 2 sprays into both nostrils daily as needed for allergies or rhinitis.    Marland Kitchen ibuprofen (ADVIL,MOTRIN) 200 MG tablet Take 200 mg by mouth every 6 (six) hours as needed.    Marland Kitchen levothyroxine (  SYNTHROID, LEVOTHROID) 50 MCG tablet Take 75 mcg by mouth daily before breakfast.     . Multiple Vitamins-Minerals (CENTRUM SILVER ADULT 50+ PO) Take 1 tablet by mouth daily.    . potassium chloride (KLOR-CON 10) 10 MEQ tablet Take 4 tablets by mouth daily.     . sertraline (ZOLOFT) 100 MG tablet Take 1 tablet by mouth daily.    Marland Kitchen triamterene-hydrochlorothiazide (MAXZIDE-25) 37.5-25 MG per tablet Take 1 tablet by mouth daily.    . lansoprazole (PREVACID) 30 MG capsule Take 30 mg by mouth daily at 12 noon.     . mometasone (NASONEX) 50 MCG/ACT nasal spray Place 2 sprays into the nose daily as needed.      No current facility-administered medications for this visit.    Facility-Administered Medications Ordered in Other Visits  Medication Dose Route Frequency Provider Last Rate Last Dose  . heparin lock flush 100 unit/mL  500 Units Intravenous Once Choksi, Janak, MD      . sodium chloride 0.9 % injection 10 mL  10 mL Intravenous PRN Forest Gleason, MD   10 mL at 11/21/14 1108  . sodium chloride 0.9 % injection 10 mL  10 mL Intravenous PRN Forest Gleason, MD   10 mL at 03/21/15 1044    Review of Systems General: fatigue, weakness  HEENT: no complaints  Lungs: SOB, cough  Cardiac: no complaints  GI:  bloating, abdominal pain, diarrhea; early satiety/distension which seems worse. She has a long-standing GI complaints and diagnosis of IBS with predominance diarrhea symptoms  GU: history of yeast infections that she has self medicated with topical anti-fungals.   Musculoskeletal: back pain  Extremities: leg swelling  Skin: occasional yeast infections under the breast and groin that she treats with topical cream Dr. Sabra Heck gave her.   Neuro: no complaints  Endocrine: no complaints  Psych: depression                           Objective:  Physical Examination:  BP (!) 149/83 (BP Location: Left Arm, Patient Position: Sitting)   Pulse 82   Temp 98.2 F (36.8 C) (Tympanic)   Resp 20   Ht _0  (1.6 m)   Wt 180 lb 3.2 oz (81.7 kg)   BMI 31.92 kg/m    ECOG Performance Status: 0 - Asymptomatic  General appearance: alert, cooperative and appears stated age HEENT:PERRLA, extra ocular movement intact and sclera clear, anicteric Lymph node survey: non-palpable, axillary, inguinal, supraclavicular Cardiovascular: regular rate and rhythm Respiratory: normal air entry, lungs clear to auscultation Breast exam: Not done Abdomen: soft, non-tender, without masses or organomegaly, hernia at umbilicus and now extending superiorly and inferiorly at least 12 cm in length and well healed incision. No ascites Extremities: extremities normal, atraumatic, no cyanosis or edema Neurological exam reveals alert, oriented, normal speech, no focal findings or movement disorder noted.  Pelvic: exam chaperoned by nurse;  Vulva: normal appearing vulva with no masses,; Vagina: normal vagina; Adnexa: surgically absent; Uterus: surgically absent, vaginal cuff well healed; Cervix: absent; Rectal: normal rectal, no masses    Lab Review Labs on site today: pending CA125  Radiologic Imaging: CT C/A/P ordered    Assessment:  Leah Santos is a 76 y.o. female diagnosed with stage II   ovarian  cancer. Clinically NED but symptoms concerning for recurrence. She has baseline IBS but the early satiety and increased distension is concerning. She assumed it was due to the fast food she  ate but her symptoms should have improved.    Body mass index is 31.92 kg/m. Incisional ventral hernia, asymptomatic.    Medical co-morbidities complicating care: HTN, obesity and prior abdominal surgery.  Plan:   Problem List Items Addressed This Visit    Candidiasis, skin    Visit Diagnoses    History of ovarian cancer    -  Primary      Follow up CA125, CT scan C/A/P, creatinine which has been ordered today. If abnormal she will see Dr. Rogue Bussing next month. If negative and her symptoms are not improving or are worsening we can order a PET scan. If negative scan and symptoms consistent with baseline IBS the follow up with Dr. Rogue Bussing in 6 months and Gyn Onc in one year.    At this point she is over 4 years out from completion of chemotherapy and without disease. Therefore we can extend her surveillance times to 6 months intervals. If she is NED in 5 years we will refer to gynecology for assistance with surveillance.   Mammogram and breast cancer screening per her PCP.   In the past we have discussed her weight and need for weight loss, stressed good nutrition, and exercise. She has been previously given a Radiation protection practitioner.   I personally reviewed the patient's history, completed key elements of her exam, and was involved in decision making in conjunction with Ms. Allen.   Gillis Ends, MD     CC:  Rusty Aus, MD [facility] [place], [place] [address] [phone]

## 2017-03-11 ENCOUNTER — Inpatient Hospital Stay: Payer: Medicare Other | Attending: Obstetrics and Gynecology | Admitting: Obstetrics and Gynecology

## 2017-03-11 ENCOUNTER — Other Ambulatory Visit: Payer: Self-pay

## 2017-03-11 ENCOUNTER — Inpatient Hospital Stay: Payer: Medicare Other

## 2017-03-11 VITALS — BP 149/83 | HR 82 | Temp 98.2°F | Resp 20 | Ht 63.0 in | Wt 180.2 lb

## 2017-03-11 DIAGNOSIS — Z8543 Personal history of malignant neoplasm of ovary: Secondary | ICD-10-CM

## 2017-03-11 DIAGNOSIS — E039 Hypothyroidism, unspecified: Secondary | ICD-10-CM

## 2017-03-11 DIAGNOSIS — Z9221 Personal history of antineoplastic chemotherapy: Secondary | ICD-10-CM | POA: Diagnosis not present

## 2017-03-11 DIAGNOSIS — K432 Incisional hernia without obstruction or gangrene: Secondary | ICD-10-CM | POA: Diagnosis not present

## 2017-03-11 DIAGNOSIS — Z6831 Body mass index (BMI) 31.0-31.9, adult: Secondary | ICD-10-CM | POA: Diagnosis not present

## 2017-03-11 DIAGNOSIS — Z79899 Other long term (current) drug therapy: Secondary | ICD-10-CM

## 2017-03-11 DIAGNOSIS — E785 Hyperlipidemia, unspecified: Secondary | ICD-10-CM | POA: Diagnosis not present

## 2017-03-11 DIAGNOSIS — Z90722 Acquired absence of ovaries, bilateral: Secondary | ICD-10-CM | POA: Diagnosis not present

## 2017-03-11 DIAGNOSIS — C561 Malignant neoplasm of right ovary: Secondary | ICD-10-CM

## 2017-03-11 DIAGNOSIS — I10 Essential (primary) hypertension: Secondary | ICD-10-CM

## 2017-03-11 DIAGNOSIS — R197 Diarrhea, unspecified: Secondary | ICD-10-CM | POA: Diagnosis not present

## 2017-03-11 DIAGNOSIS — E669 Obesity, unspecified: Secondary | ICD-10-CM | POA: Diagnosis not present

## 2017-03-11 DIAGNOSIS — E559 Vitamin D deficiency, unspecified: Secondary | ICD-10-CM | POA: Diagnosis not present

## 2017-03-11 DIAGNOSIS — K219 Gastro-esophageal reflux disease without esophagitis: Secondary | ICD-10-CM | POA: Diagnosis not present

## 2017-03-11 LAB — CREATININE, SERUM
CREATININE: 0.88 mg/dL (ref 0.44–1.00)
GFR calc Af Amer: >60 mL/min (ref >60)
GFR calc non Af Amer: >60 mL/min (ref >60)

## 2017-03-11 NOTE — Progress Notes (Signed)
  Oncology Nurse Navigator Documentation  Navigator Location: [facility] (03/11/17 1400)   )Navigator Encounter Type: Follow-up Appt (03/11/17 1400)  Requested CT scan be scheduled before she leaves today. Labs today. Follow up with Dr. Rogue Bussing in May for 6 month follow up and Dr. Theora Gianotti in one year.                   Patient Visit Type: GynOnc (03/11/17 1400)                              Time Spent with Patient: 30 (03/11/17 1400)

## 2017-03-12 LAB — CA 125: CANCER ANTIGEN (CA) 125: 18.3 U/mL (ref 0.0–38.1)

## 2017-03-20 ENCOUNTER — Ambulatory Visit: Payer: Medicare Other

## 2017-03-31 ENCOUNTER — Ambulatory Visit: Admission: RE | Admit: 2017-03-31 | Payer: Medicare Other | Source: Ambulatory Visit

## 2017-04-22 ENCOUNTER — Ambulatory Visit: Payer: Medicare Other | Admitting: Internal Medicine

## 2017-04-22 ENCOUNTER — Other Ambulatory Visit: Payer: Medicare Other

## 2017-05-04 ENCOUNTER — Ambulatory Visit: Admission: RE | Admit: 2017-05-04 | Payer: Medicare Other | Source: Ambulatory Visit

## 2017-05-12 ENCOUNTER — Encounter: Payer: Self-pay | Admitting: *Deleted

## 2017-05-13 ENCOUNTER — Ambulatory Visit: Admission: RE | Admit: 2017-05-13 | Payer: Medicare Other | Source: Ambulatory Visit | Admitting: Internal Medicine

## 2017-05-13 ENCOUNTER — Encounter: Admission: RE | Payer: Self-pay | Source: Ambulatory Visit

## 2017-05-13 ENCOUNTER — Encounter: Payer: Self-pay | Admitting: Anesthesiology

## 2017-05-13 SURGERY — ESOPHAGOGASTRODUODENOSCOPY (EGD) WITH PROPOFOL
Anesthesia: General

## 2017-06-17 ENCOUNTER — Ambulatory Visit
Admission: RE | Admit: 2017-06-17 | Discharge: 2017-06-17 | Disposition: A | Discharge status: Home / Self Care | Payer: Medicare Other | Source: Ambulatory Visit | Attending: Internal Medicine | Admitting: Internal Medicine

## 2017-06-17 ENCOUNTER — Other Ambulatory Visit: Payer: Self-pay | Admitting: Internal Medicine

## 2017-06-17 DIAGNOSIS — G319 Degenerative disease of nervous system, unspecified: Secondary | ICD-10-CM | POA: Insufficient documentation

## 2017-06-17 DIAGNOSIS — H539 Unspecified visual disturbance: Secondary | ICD-10-CM

## 2017-06-17 DIAGNOSIS — R51 Headache: Secondary | ICD-10-CM | POA: Insufficient documentation

## 2017-06-17 DIAGNOSIS — R519 Headache, unspecified: Secondary | ICD-10-CM

## 2017-08-19 ENCOUNTER — Other Ambulatory Visit: Payer: Self-pay

## 2017-08-19 ENCOUNTER — Inpatient Hospital Stay: Payer: Medicare Other | Attending: Internal Medicine

## 2017-08-19 ENCOUNTER — Encounter: Payer: Self-pay | Admitting: Internal Medicine

## 2017-08-19 ENCOUNTER — Inpatient Hospital Stay (HOSPITAL_BASED_OUTPATIENT_CLINIC_OR_DEPARTMENT_OTHER): Payer: Medicare Other | Admitting: Internal Medicine

## 2017-08-19 VITALS — BP 120/70 | Resp 16 | Wt 179.2 lb

## 2017-08-19 DIAGNOSIS — R140 Abdominal distension (gaseous): Secondary | ICD-10-CM | POA: Insufficient documentation

## 2017-08-19 DIAGNOSIS — C561 Malignant neoplasm of right ovary: Secondary | ICD-10-CM

## 2017-08-19 LAB — CBC WITH DIFFERENTIAL/PLATELET
BASOS ABS: 0.1 10*3/uL (ref 0–0.1)
Basophils Relative: 1 %
Eosinophils Absolute: 0.3 10*3/uL (ref 0–0.7)
Eosinophils Relative: 3 %
HEMATOCRIT: 43.3 % (ref 35.0–47.0)
Hemoglobin: 15.4 g/dL (ref 12.0–16.0)
LYMPHS PCT: 26 %
Lymphs Abs: 2.4 10*3/uL (ref 1.0–3.6)
MCH: 31 pg (ref 26.0–34.0)
MCHC: 35.5 g/dL (ref 32.0–36.0)
MCV: 87.3 fL (ref 80.0–100.0)
MONO ABS: 0.6 10*3/uL (ref 0.2–0.9)
MONOS PCT: 7 %
NEUTROS ABS: 6 10*3/uL (ref 1.4–6.5)
Neutrophils Relative %: 63 %
Platelets: 198 10*3/uL (ref 150–440)
RBC: 4.96 MIL/uL (ref 3.80–5.20)
RDW: 15.7 % — AB (ref 11.5–14.5)
WBC: 9.4 10*3/uL (ref 3.6–11.0)

## 2017-08-19 LAB — COMPREHENSIVE METABOLIC PANEL
ALBUMIN: 4.2 g/dL (ref 3.5–5.0)
ALT: 34 U/L (ref 14–54)
ANION GAP: 13 (ref 5–15)
AST: 31 U/L (ref 15–41)
Alkaline Phosphatase: 77 U/L (ref 38–126)
BUN: 17 mg/dL (ref 6–20)
CO2: 25 mmol/L (ref 22–32)
Calcium: 10.1 mg/dL (ref 8.9–10.3)
Chloride: 101 mmol/L (ref 101–111)
Creatinine, Ser: 0.9 mg/dL (ref 0.44–1.00)
GFR calc Af Amer: >60 mL/min (ref >60)
GFR calc non Af Amer: >60 mL/min (ref >60)
GLUCOSE: 111 mg/dL — AB (ref 65–99)
POTASSIUM: 3.3 mmol/L — AB (ref 3.5–5.1)
Sodium: 139 mmol/L (ref 135–145)
Total Bilirubin: 0.9 mg/dL (ref 0.3–1.2)
Total Protein: 7.6 g/dL (ref 6.5–8.1)

## 2017-08-19 NOTE — Assessment & Plan Note (Addendum)
#   STAGE II right-sided Ovarian cancer status post TAH/BSO followed by adjuvant chemotherapy.  #Given the abdominal distention/discomfort progressive-recommend CT scan chest and pelvis [as previously ordered by Dr. Theora Gianotti gynecology oncology also await CEA 125.]  #Fatigue-caregiver stress Vernie Shanks been sick/patient primary caregiver].   # follow up in 12 months cbc/cmp/ca-125- CT scans thru Dr.Secord-will schedule now.

## 2017-08-19 NOTE — Progress Notes (Signed)
[facility] OFFICE PROGRESS NOTE  Patient Care Team: Rusty Aus, MD as PCP - General (Internal Medicine) Clent Jacks, RN as Registered Nurse  Cancer Staging No matching staging information was found for the patient.  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 [Dr.Secord]  # BRCA 1& 2- NEG      Ovarian cancer, right ([place])   01/24/2016 Initial Diagnosis    Ovarian cancer, right [facility])       Oncology History   #  2014  RIGHT ovarian cancer stage II s/p TAH & BSO; carbo-abraxane x 6 months. [Dr.Secord]  # BRCA 1& 2- NEG      Ovarian cancer, right ([place])   01/24/2016 Initial Diagnosis    Ovarian cancer, right ([place])        INTERVAL HISTORY:  Leah Santos 77 y.o.  female pleasant patient above history of Ovarian cancer stage II surgery followed by adjuvant chemotherapy is here for follow-up.    Patient states her husband has been sick since April 2018 when her husband had CABG and aortic valve replacement.  Patient has been her husband's primary caregiver.   Patient was evaluated by gynecology oncology fall of last year-recommended CT given the concerns for recurrence abdominal distention/discomfort./Patient did not have that scheduled because of caregiver duties.  She was also supposed to get a EGD-4 history of Barrett's/ And also mammogram which were not done because of her busy schedule.  Patient complains of abdominal distention.  Mild nausea no vomiting.  No constipation.  Appetite is good. Denies any difficulty swallowing or pain with swallowing.  REVIEW OF SYSTEMS:  A complete 10 point review of system is done which is negative except mentioned above/history of present illness.   PAST MEDICAL HISTORY :  Past Medical History:  Diagnosis Date  . Asthma   . Barrett's esophagus   . Cellulitis    Right lower extremity  . Chronic acquired lymphedema    since childhood  . GERD  (gastroesophageal reflux disease)   . History of cellulitis   . History of chemotherapy    finished total 6 cycles of chemotherapy(initially with carboplatinum Taxol followed by carboplatinum and gemcitabine(February, 2015)  . History of colon polyps   . History of colonoscopy with polypectomy 11/07/13  . History of genital warts 1986  . History of mammogram 04/29/2014  . HTN (hypertension)   . Hypothyroidism   . IBS (irritable bowel syndrome)   . Ovarian cancer on left ([facility]CC)    No BRCA mutation-Left ovarian cancer, adenocarcinoma.pT2 pNO  M0     PAST SURGICAL HISTORY :   Past Surgical History:  Procedure Laterality Date  . BREAST BIOPSY Right 1994   neg  . BREAST LUMPECTOMY  1962  . CHOLECYSTECTOMY  1996  . COLONOSCOPY W/ POLYPECTOMY  11/07/13  . ESOPHAGOGASTRODUODENOSCOPY ENDOSCOPY  11/07/13  . Exp. Laparotomy, TAH, Salpingo-oophorectomy, Appendectomy  12/2012  . [place]  . TUBAL LIGATION      FAMILY HISTORY :   Family History  Problem Relation Age of Onset  . Ovarian cancer Mother 59  . Breast cancer Maternal Aunt   . Breast cancer Maternal Aunt 70  . Lung cancer Maternal Uncle 75  . Prostate cancer Maternal Uncle 65  . Uterine cancer Maternal Aunt 37    SOCIAL HISTORY:   Social History   Tobacco Use  . Smoking status: Never Smoker  .  Smokeless tobacco: Never Used  Substance Use Topics  . Alcohol use: No    Alcohol/week: 0.0 oz  . Drug use: No    ALLERGIES:  is allergic to statins; taxol [paclitaxel]; sulfa antibiotics; and tape.  MEDICATIONS:  Current Outpatient Medications  Medication Sig Dispense Refill  . Cholecalciferol (VITAMIN D3) 1000 UNITS CAPS Take 1 tablet by mouth daily.    . fluticasone (FLONASE) 50 MCG/ACT nasal spray Place 2 sprays into both nostrils daily as needed for allergies or rhinitis.    Marland Kitchen levothyroxine (SYNTHROID, LEVOTHROID) 50 MCG tablet Take 75 mcg by mouth daily before breakfast.     . Multiple Vitamins-Minerals  (CENTRUM SILVER ADULT 50+ PO) Take 1 tablet by mouth daily.    . potassium chloride (KLOR-CON 10) 10 MEQ tablet Take 4 tablets by mouth daily.     Marland Kitchen venlafaxine XR (EFFEXOR-XR) 75 MG 24 hr capsule Take by mouth.    Marland Kitchen albuterol (PROVENTIL HFA) 108 (90 Base) MCG/ACT inhaler Inhale 2 puffs into the lungs every 4 (four) hours as needed.    Marland Kitchen ibuprofen (ADVIL,MOTRIN) 200 MG tablet Take 200 mg by mouth every 6 (six) hours as needed.    . lansoprazole (PREVACID) 30 MG capsule Take 30 mg by mouth daily at 12 noon.     . mometasone (NASONEX) 50 MCG/ACT nasal spray Place 2 sprays into the nose daily as needed.     . sertraline (ZOLOFT) 100 MG tablet Take 1 tablet by mouth daily.    Marland Kitchen triamterene-hydrochlorothiazide (MAXZIDE-25) 37.5-25 MG per tablet Take 1 tablet by mouth daily.     No current facility-administered medications for this visit.    Facility-Administered Medications Ordered in Other Visits  Medication Dose Route Frequency Provider Last Rate Last Dose  . heparin lock flush 100 unit/mL  500 Units Intravenous Once Choksi, Janak, MD      . sodium chloride 0.9 % injection 10 mL  10 mL Intravenous PRN Forest Gleason, MD   10 mL at 11/21/14 1108  . sodium chloride 0.9 % injection 10 mL  10 mL Intravenous PRN Forest Gleason, MD   10 mL at 03/21/15 1044    PHYSICAL EXAMINATION: ECOG PERFORMANCE STATUS: 0 - Asymptomatic  BP 120/70 (BP Location: Left Arm, Patient Position: Sitting)   Resp 16   Wt 179 lb 3.2 oz (81.3 kg)   BMI 31.74 kg/m   Filed Weights   08/19/17 1422  Weight: 179 lb 3.2 oz (81.3 kg)    GENERAL: Well-nourished well-developed; Alert, no distress and comfortable.   Alone. EYES: no pallor or icterus OROPHARYNX: no thrush or ulceration; good dentition  NECK: supple, no masses felt LYMPH:  no palpable lymphadenopathy in the cervical, axillary or inguinal regions LUNGS: clear to auscultation and  No wheeze or crackles HEART/CVS: regular rate & rhythm and no murmurs; No lower  extremity edema ABDOMEN:abdomen soft, non-tender and normal bowel sounds Musculoskeletal:no cyanosis of digits and no clubbing  PSYCH: alert & oriented x 3 with fluent speech NEURO: no focal motor/sensory deficits SKIN:  no rashes or significant lesions  LABORATORY DATA:  I have reviewed the data as listed    Component Value Date/Time   NA 139 08/19/2017 1337   NA 140 05/24/2014 1034   K 3.3 (L) 08/19/2017 1337   K 3.3 (L) 05/24/2014 1034   CL 101 08/19/2017 1337   CL 97 (L) 05/24/2014 1034   CO2 25 08/19/2017 1337   CO2 30 05/24/2014 1034   GLUCOSE 111 (H) 08/19/2017  1337   GLUCOSE 152 (H) 05/24/2014 1034   BUN 17 08/19/2017 1337   BUN 19 (H) 05/24/2014 1034   CREATININE 0.90 08/19/2017 1337   CREATININE 1.55 (H) 05/24/2014 1034   CALCIUM 10.1 08/19/2017 1337   CALCIUM 9.6 05/24/2014 1034   PROT 7.6 08/19/2017 1337   PROT 7.6 05/24/2014 1034   ALBUMIN 4.2 08/19/2017 1337   ALBUMIN 3.8 05/24/2014 1034   AST 31 08/19/2017 1337   AST 30 05/24/2014 1034   ALT 34 08/19/2017 1337   ALT 55 05/24/2014 1034   ALKPHOS 77 08/19/2017 1337   ALKPHOS 98 05/24/2014 1034   BILITOT 0.9 08/19/2017 1337   BILITOT 0.5 05/24/2014 1034   GFRNONAA >60 08/19/2017 1337   GFRNONAA 35 (L) 05/24/2014 1034   GFRNONAA 60 (L) 11/08/2013 1115   GFRAA >60 08/19/2017 1337   GFRAA 42 (L) 05/24/2014 1034   GFRAA >60 11/08/2013 1115    No results found for: SPEP, UPEP  Lab Results  Component Value Date   WBC 9.4 08/19/2017   NEUTROABS 6.0 08/19/2017   HGB 15.4 08/19/2017   HCT 43.3 08/19/2017   MCV 87.3 08/19/2017   PLT 198 08/19/2017      Chemistry      Component Value Date/Time   NA 139 08/19/2017 1337   NA 140 05/24/2014 1034   K 3.3 (L) 08/19/2017 1337   K 3.3 (L) 05/24/2014 1034   CL 101 08/19/2017 1337   CL 97 (L) 05/24/2014 1034   CO2 25 08/19/2017 1337   CO2 30 05/24/2014 1034   BUN 17 08/19/2017 1337   BUN 19 (H) 05/24/2014 1034   CREATININE 0.90 08/19/2017 1337    CREATININE 1.55 (H) 05/24/2014 1034      Component Value Date/Time   CALCIUM 10.1 08/19/2017 1337   CALCIUM 9.6 05/24/2014 1034   ALKPHOS 77 08/19/2017 1337   ALKPHOS 98 05/24/2014 1034   AST 31 08/19/2017 1337   AST 30 05/24/2014 1034   ALT 34 08/19/2017 1337   ALT 55 05/24/2014 1034   BILITOT 0.9 08/19/2017 1337   BILITOT 0.5 05/24/2014 1034       RADIOGRAPHIC STUDIES: I have personally reviewed the radiological images as listed and agreed with the findings in the report. No results found.   ASSESSMENT & PLAN:  Ovarian cancer, right ([place]) # STAGE II right-sided Ovarian cancer status post TAH/BSO followed by adjuvant chemotherapy.  #Given the abdominal distention/discomfort progressive-recommend CT scan chest and pelvis [as previously ordered by Dr. Theora Gianotti gynecology oncology also await CEA 125.]  #Fatigue-caregiver stress Leah Santos been sick/patient primary caregiver].   # follow up in 12 months cbc/cmp/ca-125- CT scans thru Dr.Secord-will schedule now.   Orders Placed This Encounter  Procedures  . CBC with Differential/Platelet    Standing Status:   Future    Standing Expiration Date:   08/20/2018  . Comprehensive metabolic panel    Standing Status:   Future    Standing Expiration Date:   08/20/2018  . CA 125    Standing Status:   Future    Standing Expiration Date:   08/19/2018   All questions were answered. The patient knows to call the clinic with any problems, questions or concerns.      Cammie Sickle, MD 08/19/2017 3:16 PM

## 2017-08-20 LAB — CA 125: Cancer Antigen (CA) 125: 16.5 U/mL (ref 0.0–38.1)

## 2017-08-26 ENCOUNTER — Ambulatory Visit: Admission: RE | Admit: 2017-08-26 | Payer: Medicare Other | Source: Ambulatory Visit

## 2017-08-26 ENCOUNTER — Ambulatory Visit
Admission: RE | Admit: 2017-08-26 | Discharge: 2017-08-26 | Disposition: A | Discharge status: Home / Self Care | Payer: Medicare Other | Source: Ambulatory Visit | Attending: Nurse Practitioner | Admitting: Nurse Practitioner

## 2017-08-26 DIAGNOSIS — I700 Atherosclerosis of aorta: Secondary | ICD-10-CM | POA: Insufficient documentation

## 2017-08-26 DIAGNOSIS — K769 Liver disease, unspecified: Secondary | ICD-10-CM | POA: Insufficient documentation

## 2017-08-26 DIAGNOSIS — C561 Malignant neoplasm of right ovary: Secondary | ICD-10-CM | POA: Diagnosis not present

## 2017-08-26 MED ORDER — IOPAMIDOL (ISOVUE-300) INJECTION 61%
100.0000 mL | Freq: Once | INTRAVENOUS | Status: AC | PRN
  Administered 2017-08-26: 100 mL via INTRAVENOUS

## 2017-09-21 ENCOUNTER — Telehealth: Payer: Self-pay | Admitting: Internal Medicine

## 2017-09-21 NOTE — Telephone Encounter (Signed)
Leah Santos-please inform patient that CT scan negative for any recurrence of her cancer;stable cysts seen in liver [stable from 2015- likely benign] follow-up as planned no new recommendations. Thx

## 2017-09-22 ENCOUNTER — Telehealth: Payer: Self-pay | Admitting: Nurse Practitioner

## 2017-09-22 NOTE — Telephone Encounter (Signed)
Attempted to contact patient regarding results.   CT scan negative for any recurrence of her cancer;stable cysts seen in liver [stable from 2015- likely benign] follow-up as planned no new recommendations.  Message left to return call.

## 2017-09-23 NOTE — Telephone Encounter (Signed)
Per Ander Purpura- np left msg for patient to return her phone call.

## 2017-12-21 ENCOUNTER — Encounter: Payer: Self-pay | Admitting: *Deleted

## 2017-12-21 ENCOUNTER — Emergency Department: Payer: Medicare Other

## 2017-12-21 ENCOUNTER — Other Ambulatory Visit: Payer: Self-pay

## 2017-12-21 ENCOUNTER — Inpatient Hospital Stay
Admission: EM | Admit: 2017-12-21 | Discharge: 2017-12-23 | DRG: 872 | Disposition: A | Discharge status: Home / Self Care | Payer: Medicare Other | Attending: Internal Medicine | Admitting: Internal Medicine

## 2017-12-21 DIAGNOSIS — Z9049 Acquired absence of other specified parts of digestive tract: Secondary | ICD-10-CM

## 2017-12-21 DIAGNOSIS — E039 Hypothyroidism, unspecified: Secondary | ICD-10-CM | POA: Diagnosis present

## 2017-12-21 DIAGNOSIS — A419 Sepsis, unspecified organism: Secondary | ICD-10-CM | POA: Diagnosis present

## 2017-12-21 DIAGNOSIS — I10 Essential (primary) hypertension: Secondary | ICD-10-CM | POA: Diagnosis present

## 2017-12-21 DIAGNOSIS — K219 Gastro-esophageal reflux disease without esophagitis: Secondary | ICD-10-CM | POA: Diagnosis present

## 2017-12-21 DIAGNOSIS — Z79899 Other long term (current) drug therapy: Secondary | ICD-10-CM | POA: Diagnosis not present

## 2017-12-21 DIAGNOSIS — K589 Irritable bowel syndrome without diarrhea: Secondary | ICD-10-CM | POA: Diagnosis present

## 2017-12-21 DIAGNOSIS — Z8543 Personal history of malignant neoplasm of ovary: Secondary | ICD-10-CM | POA: Diagnosis not present

## 2017-12-21 DIAGNOSIS — R509 Fever, unspecified: Secondary | ICD-10-CM | POA: Diagnosis present

## 2017-12-21 DIAGNOSIS — Z9221 Personal history of antineoplastic chemotherapy: Secondary | ICD-10-CM | POA: Diagnosis not present

## 2017-12-21 DIAGNOSIS — L03115 Cellulitis of right lower limb: Secondary | ICD-10-CM

## 2017-12-21 DIAGNOSIS — E119 Type 2 diabetes mellitus without complications: Secondary | ICD-10-CM | POA: Diagnosis present

## 2017-12-21 DIAGNOSIS — Z79890 Hormone replacement therapy (postmenopausal): Secondary | ICD-10-CM

## 2017-12-21 DIAGNOSIS — J45909 Unspecified asthma, uncomplicated: Secondary | ICD-10-CM | POA: Diagnosis present

## 2017-12-21 DIAGNOSIS — E876 Hypokalemia: Secondary | ICD-10-CM | POA: Diagnosis present

## 2017-12-21 LAB — COMPREHENSIVE METABOLIC PANEL
ALBUMIN: 3.7 g/dL (ref 3.5–5.0)
ALK PHOS: 70 U/L (ref 38–126)
ALT: 25 U/L (ref 0–44)
ANION GAP: 10 (ref 5–15)
AST: 24 U/L (ref 15–41)
BUN: 17 mg/dL (ref 8–23)
CO2: 30 mmol/L (ref 22–32)
Calcium: 9.4 mg/dL (ref 8.9–10.3)
Chloride: 98 mmol/L (ref 98–111)
Creatinine, Ser: 0.97 mg/dL (ref 0.44–1.00)
GFR calc Af Amer: >60 mL/min (ref >60)
GFR calc non Af Amer: 55 mL/min — ABNORMAL LOW (ref >60)
GLUCOSE: 197 mg/dL — AB (ref 70–99)
POTASSIUM: 2.8 mmol/L — AB (ref 3.5–5.1)
SODIUM: 138 mmol/L (ref 135–145)
Total Bilirubin: 0.8 mg/dL (ref 0.3–1.2)
Total Protein: 7.1 g/dL (ref 6.5–8.1)

## 2017-12-21 LAB — CBC WITH DIFFERENTIAL/PLATELET
Basophils Absolute: 0.1 10*3/uL (ref 0–0.1)
Basophils Relative: 1 %
Eosinophils Absolute: 0.2 10*3/uL (ref 0–0.7)
Eosinophils Relative: 2 %
HEMATOCRIT: 38.9 % (ref 35.0–47.0)
HEMOGLOBIN: 13.8 g/dL (ref 12.0–16.0)
LYMPHS ABS: 2.2 10*3/uL (ref 1.0–3.6)
Lymphocytes Relative: 18 %
MCH: 31.3 pg (ref 26.0–34.0)
MCHC: 35.4 g/dL (ref 32.0–36.0)
MCV: 88.5 fL (ref 80.0–100.0)
MONO ABS: 0.7 10*3/uL (ref 0.2–0.9)
Monocytes Relative: 5 %
NEUTROS ABS: 9.2 10*3/uL — AB (ref 1.4–6.5)
NEUTROS PCT: 74 %
PLATELETS: 183 10*3/uL (ref 150–440)
RBC: 4.4 MIL/uL (ref 3.80–5.20)
RDW: 14.2 % (ref 11.5–14.5)
WBC: 12.3 10*3/uL — ABNORMAL HIGH (ref 3.6–11.0)

## 2017-12-21 LAB — URINALYSIS, COMPLETE (UACMP) WITH MICROSCOPIC
BACTERIA UA: NONE SEEN
BILIRUBIN URINE: NEGATIVE
Glucose, UA: NEGATIVE mg/dL
HGB URINE DIPSTICK: NEGATIVE
KETONES UR: NEGATIVE mg/dL
Leukocytes, UA: NEGATIVE
Nitrite: NEGATIVE
PH: 7 (ref 5.0–8.0)
Protein, ur: NEGATIVE mg/dL
SPECIFIC GRAVITY, URINE: 1.015 (ref 1.005–1.030)

## 2017-12-21 LAB — LACTIC ACID, PLASMA: Lactic Acid, Venous: 2 mmol/L (ref 0.5–1.9)

## 2017-12-21 LAB — TROPONIN I: Troponin I: <0.03 ng/mL (ref <0.03)

## 2017-12-21 MED ORDER — ACETAMINOPHEN 500 MG PO TABS
ORAL_TABLET | ORAL | Status: AC
  Filled 2017-12-21: qty 2

## 2017-12-21 MED ORDER — ACETAMINOPHEN 500 MG PO TABS
1000.0000 mg | ORAL_TABLET | Freq: Once | ORAL | Status: AC
  Administered 2017-12-21: 1000 mg via ORAL

## 2017-12-21 MED ORDER — SODIUM CHLORIDE 0.9 % IV SOLN
2.0000 g | INTRAVENOUS | Status: DC
  Administered 2017-12-21: 2 g via INTRAVENOUS
  Filled 2017-12-21: qty 2

## 2017-12-21 MED ORDER — SODIUM CHLORIDE 0.9 % IV BOLUS
500.0000 mL | Freq: Once | INTRAVENOUS | Status: AC
  Administered 2017-12-21: 500 mL via INTRAVENOUS

## 2017-12-21 NOTE — ED Notes (Signed)
Pt went to ultrasound.

## 2017-12-21 NOTE — ED Triage Notes (Signed)
Pt to triage via wheelchair. Pt has pain and swelling to right foot   No known injury.  Red swollen area to right foot.  Pt alert.

## 2017-12-21 NOTE — ED Notes (Signed)
Leah Such RN helped the Pt to the bedpan. Pt voided.

## 2017-12-21 NOTE — ED Provider Notes (Signed)
[facility] Emergency Department Provider Note    First MD Initiated Contact with Patient 12/21/17 1952     (approximate)  I have reviewed the triage vital signs and the nursing notes.   HISTORY  Chief Complaint Foot Pain    HPI Leah Santos is a 77 y.o. female presents the ER for evaluation of right leg pain fevers and chills.  States she is also had some burning when she urinates.  States she has had a history of cellulitis of right leg.  Does have ah/o diabetes.  Denies any injury to the right leg.  States the pain is mild to moderate.  States that she is having urinary increased frequency.  Denies any abdominal pain.  No flank pain.  No chest pain or shortness of breath.    Past Medical History:  Diagnosis Date  . Asthma   . Barrett's esophagus   . Cellulitis    Right lower extremity  . Chronic acquired lymphedema    since childhood  . GERD (gastroesophageal reflux disease)   . History of cellulitis   . History of chemotherapy    finished total 6 cycles of chemotherapy(initially with carboplatinum Taxol followed by carboplatinum and gemcitabine(February, 2015)  . History of colon polyps   . History of colonoscopy with polypectomy 11/07/13  . History of genital warts 1986  . History of mammogram 04/29/2014  . HTN (hypertension)   . Hypothyroidism   . IBS (irritable bowel syndrome)   . 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 Ovarian cancer Mother 39  . Breast cancer Maternal Aunt   . Breast cancer Maternal Aunt 70  . Lung cancer Maternal Uncle 75  . Prostate cancer Maternal Uncle 65  . Uterine cancer Maternal Aunt 41   Past Surgical History:  Procedure Laterality Date  . BREAST BIOPSY Right 1994   neg  . BREAST LUMPECTOMY  1962  . CHOLECYSTECTOMY  1996  . COLONOSCOPY W/ POLYPECTOMY  11/07/13  . ESOPHAGOGASTRODUODENOSCOPY  ENDOSCOPY  11/07/13  . Exp. Laparotomy, TAH, Salpingo-oophorectomy, Appendectomy  12/2012  . [place]  . TUBAL LIGATION     Patient Active Problem List   Diagnosis Date Noted  . Ovarian cancer, right ([place]) 01/24/2016  . Ovarian cancer ([place]) 10/24/2015  . Avitaminosis D 02/21/2015  . Acid reflux 12/27/2014  . History of colon polyps 12/27/2014  . HLD (hyperlipidemia) 12/27/2014  . BP (high blood pressure) 12/27/2014  . Obstructive apnea 12/27/2014  . H/O ovarian cancer 02/16/2014  . Acquired hypothyroidism 02/16/2014      Prior to Admission medications   Medication Sig Start Date End Date Taking? Authorizing Provider  Cholecalciferol (VITAMIN D3) 1000 UNITS CAPS Take 1 tablet by mouth daily.    [provider]  fluticasone (FLONASE) 50 MCG/ACT nasal spray Place 2 sprays into both nostrils daily as needed for allergies or rhinitis.    [provider]  ibuprofen (ADVIL,MOTRIN) 200 MG tablet Take 200 mg by mouth every 6 (six) hours as needed.    [provider]  lansoprazole (PREVACID) 30 MG capsule Take 30 mg by mouth daily at 12 noon.  02/21/15   [provider]  levothyroxine (SYNTHROID, LEVOTHROID) 50 MCG tablet Take 75 mcg by mouth daily before breakfast.     [provider]  mometasone (NASONEX) 50 MCG/ACT nasal spray Place  2 sprays into the nose daily as needed.     [provider]  Multiple Vitamins-Minerals (CENTRUM SILVER ADULT 50+ PO) Take 1 tablet by mouth daily.    [provider]  potassium chloride (KLOR-CON 10) 10 MEQ tablet Take 4 tablets by mouth daily.  03/23/14   [provider]  sertraline (ZOLOFT) 100 MG tablet Take 1 tablet by mouth daily. 03/23/14   [provider]  triamterene-hydrochlorothiazide (MAXZIDE-25) 37.5-25 MG per tablet Take 1 tablet by mouth daily. 03/23/14   [provider]  venlafaxine XR (EFFEXOR-XR) 75 MG 24 hr capsule Take by mouth. 06/17/17 06/17/18   [provider]    Allergies Statins; Taxol [paclitaxel]; Sulfa antibiotics; and Tape    Social History Social History   Tobacco Use  . Smoking status: Never Smoker  . Smokeless tobacco: Never Used  Substance Use Topics  . Alcohol use: No    Alcohol/week: 0.0 standard drinks  . Drug use: No    Review of Systems Patient denies headaches, rhinorrhea, blurry vision, numbness, shortness of breath, chest pain, edema, cough, abdominal pain, nausea, vomiting, diarrhea, dysuria, fevers, rashes or hallucinations unless otherwise stated above in HPI. ____________________________________________   PHYSICAL EXAM:  VITAL SIGNS: Vitals:   12/21/17 2200 12/21/17 2230  BP: 126/74 126/63  Pulse: 65 67  Resp: 18 16  Temp:    SpO2: 97% 99%    Constitutional: Alert and oriented.  Eyes: Conjunctivae are normal.  Head: Atraumatic. Nose: No congestion/rhinnorhea. Mouth/Throat: Mucous membranes are moist.   Neck: No stridor. Painless ROM.  Cardiovascular: Normal rate, regular rhythm. Grossly normal heart sounds.  Good peripheral circulation. Respiratory: Normal respiratory effort.  No retractions. Lungs CTAB. Gastrointestinal: Soft and nontender. No distention. No abdominal bruits. No CVA tenderness. Genitourinary: deferred Musculoskeletal: Streaky cellulitis and erythema of the right lateral foot with no obvious fluctuance or laceration.  Distal extremities well-perfused.  No joint effusions. Neurologic:  Normal speech and language. No gross focal neurologic deficits are appreciated. No facial droop Skin:  Skin is warm, dry and intact. No rash noted. Psychiatric: Mood and affect are normal. Speech and behavior are normal.  ____________________________________________   LABS (all labs ordered are listed, but only abnormal results are displayed)  Results for orders placed or performed during the hospital encounter of 12/21/17 (from the past 24 hour(s))  Lactic acid, plasma      Status: Abnormal   Collection Time: 12/21/17  8:02 PM  Result Value Ref Range   Lactic Acid, Venous 2.0 (HH) 0.5 - 1.9 mmol/L  Comprehensive metabolic panel     Status: Abnormal   Collection Time: 12/21/17  8:02 PM  Result Value Ref Range   Sodium 138 135 - 145 mmol/L   Potassium 2.8 (L) 3.5 - 5.1 mmol/L   Chloride 98 98 - 111 mmol/L   CO2 30 22 - 32 mmol/L   Glucose, Bld 197 (H) 70 - 99 mg/dL   BUN 17 8 - 23 mg/dL   Creatinine, Ser 0.97 0.44 - 1.00 mg/dL   Calcium 9.4 8.9 - 10.3 mg/dL   Total Protein 7.1 6.5 - 8.1 g/dL   Albumin 3.7 3.5 - 5.0 g/dL   AST 24 15 - 41 U/L   ALT 25 0 - 44 U/L   Alkaline Phosphatase 70 38 - 126 U/L   Total Bilirubin 0.8 0.3 - 1.2 mg/dL   GFR calc non Af Amer 55 (L) >60 mL/min   GFR calc Af Amer >60 >60 mL/min  Anion gap 10 5 - 15  Troponin I     Status: None   Collection Time: 12/21/17  8:02 PM  Result Value Ref Range   Troponin I <0.03 <0.03 ng/mL  CBC WITH DIFFERENTIAL     Status: Abnormal   Collection Time: 12/21/17  8:02 PM  Result Value Ref Range   WBC 12.3 (H) 3.6 - 11.0 K/uL   RBC 4.40 3.80 - 5.20 MIL/uL   Hemoglobin 13.8 12.0 - 16.0 g/dL   HCT 38.9 35.0 - 47.0 %   MCV 88.5 80.0 - 100.0 fL   MCH 31.3 26.0 - 34.0 pg   MCHC 35.4 32.0 - 36.0 g/dL   RDW 14.2 11.5 - 14.5 %   Platelets 183 150 - 440 K/uL   Neutrophils Relative % 74 %   Neutro Abs 9.2 (H) 1.4 - 6.5 K/uL   Lymphocytes Relative 18 %   Lymphs Abs 2.2 1.0 - 3.6 K/uL   Monocytes Relative 5 %   Monocytes Absolute 0.7 0.2 - 0.9 K/uL   Eosinophils Relative 2 %   Eosinophils Absolute 0.2 0 - 0.7 K/uL   Basophils Relative 1 %   Basophils Absolute 0.1 0 - 0.1 K/uL  Urinalysis, Complete w Microscopic     Status: Abnormal   Collection Time: 12/21/17  9:27 PM  Result Value Ref Range   Color, Urine YELLOW (A) YELLOW   APPearance CLEAR (A) CLEAR   Specific Gravity, Urine 1.015 1.005 - 1.030   pH 7.0 5.0 - 8.0   Glucose, UA NEGATIVE NEGATIVE mg/dL   Hgb urine dipstick NEGATIVE  NEGATIVE   Bilirubin Urine NEGATIVE NEGATIVE   Ketones, ur NEGATIVE NEGATIVE mg/dL   Protein, ur NEGATIVE NEGATIVE mg/dL   Nitrite NEGATIVE NEGATIVE   Leukocytes, UA NEGATIVE NEGATIVE   RBC / HPF 0-5 0 - 5 RBC/hpf   WBC, UA 0-5 0 - 5 WBC/hpf   Bacteria, UA NONE SEEN NONE SEEN   Squamous Epithelial / LPF 0-5 0 - 5   ____________________________________________  EKG My review and personal interpretation at Time: 19:55   Indication: tachycardia  Rate: 80  Rhythm: sinus Axis: normal Other:  Normal intervals, poor r wave progression ____________________________________________  RADIOLOGY  I personally reviewed all radiographic images ordered to evaluate for the above acute complaints and reviewed radiology reports and findings.  These findings were personally discussed with the patient.  Please see medical record for radiology report.  ____________________________________________   PROCEDURES  Procedure(s) performed:  Procedures    Critical Care performed: no ____________________________________________   INITIAL IMPRESSION / ASSESSMENT AND PLAN / ED COURSE  Pertinent labs & imaging results that were available during my care of the patient were reviewed by me and considered in my medical decision making (see chart for details).   DDX: uti, cellulitis, pna, abscess, osteo  Leah Santos is a 77 y.o. who presents to the ED with leg pain and cellulitis as described above.  Patient arrives febrile and tachycardic.  Letter was sent off to evaluate for sepsis.  She is normotensive and otherwise nontoxic-appearing.  Will provide gentle IV hydration.  Clinical Course as of Dec 21 2257  Mon Dec 21, 2017  2251 Patient has received IV antibiotics.  Based on her ability age and the fact that she met septic criteria upon arrival to the ER do feel she would benefit from observation in the hospital for additional doses of IV antibiotics and reassessment.   [PR]    Clinical Course  User Index [PR] Merlyn Lot, MD     As part of my medical decision making, I reviewed the following data within the [facility] notes reviewed and incorporated, Labs reviewed, notes from prior ED visits and [place] Controlled Substance Database   ____________________________________________   FINAL CLINICAL IMPRESSION(S) / ED DIAGNOSES  Final diagnoses:  Sepsis, due to unspecified organism ([place])  Cellulitis of right lower extremity      NEW MEDICATIONS STARTED DURING THIS VISIT:  New Prescriptions   No medications on file     Note:  This document was prepared using Dragon voice recognition software and may include unintentional dictation errors.    Merlyn Lot, MD 12/21/17 2259

## 2017-12-22 ENCOUNTER — Other Ambulatory Visit: Payer: Self-pay

## 2017-12-22 LAB — BASIC METABOLIC PANEL
Anion gap: 12 (ref 5–15)
BUN: 15 mg/dL (ref 8–23)
CO2: 29 mmol/L (ref 22–32)
CREATININE: 0.92 mg/dL (ref 0.44–1.00)
Calcium: 9 mg/dL (ref 8.9–10.3)
Chloride: 101 mmol/L (ref 98–111)
GFR calc Af Amer: >60 mL/min (ref >60)
GFR, EST NON AFRICAN AMERICAN: 59 mL/min — AB (ref >60)
Glucose, Bld: 154 mg/dL — ABNORMAL HIGH (ref 70–99)
POTASSIUM: 2.8 mmol/L — AB (ref 3.5–5.1)
SODIUM: 142 mmol/L (ref 135–145)

## 2017-12-22 LAB — MAGNESIUM
MAGNESIUM: 1.8 mg/dL (ref 1.7–2.4)
Magnesium: 1.9 mg/dL (ref 1.7–2.4)

## 2017-12-22 LAB — CBC
HCT: 36.8 % (ref 35.0–47.0)
Hemoglobin: 13.3 g/dL (ref 12.0–16.0)
MCH: 32.3 pg (ref 26.0–34.0)
MCHC: 36 g/dL (ref 32.0–36.0)
MCV: 89.6 fL (ref 80.0–100.0)
PLATELETS: 158 10*3/uL (ref 150–440)
RBC: 4.11 MIL/uL (ref 3.80–5.20)
RDW: 13.9 % (ref 11.5–14.5)
WBC: 10 10*3/uL (ref 3.6–11.0)

## 2017-12-22 LAB — LACTIC ACID, PLASMA: LACTIC ACID, VENOUS: 1.4 mmol/L (ref 0.5–1.9)

## 2017-12-22 LAB — GLUCOSE, CAPILLARY
GLUCOSE-CAPILLARY: 149 mg/dL — AB (ref 70–99)
Glucose-Capillary: 138 mg/dL — ABNORMAL HIGH (ref 70–99)
Glucose-Capillary: 130 mg/dL — ABNORMAL HIGH (ref 70–99)
Glucose-Capillary: 150 mg/dL — ABNORMAL HIGH (ref 70–99)

## 2017-12-22 MED ORDER — LEVOTHYROXINE SODIUM 50 MCG PO TABS
75.0000 ug | ORAL_TABLET | Freq: Every day | ORAL | Status: DC
  Administered 2017-12-22 – 2017-12-23 (×2): 75 ug via ORAL
  Filled 2017-12-22 (×2): qty 2

## 2017-12-22 MED ORDER — ONDANSETRON HCL 4 MG PO TABS: 4.0000 mg | ORAL_TABLET | Freq: Four times a day (QID) | ORAL | Status: DC | PRN

## 2017-12-22 MED ORDER — FLUTICASONE PROPIONATE 50 MCG/ACT NA SUSP
2.0000 | Freq: Every day | NASAL | Status: DC | PRN
  Filled 2017-12-22: qty 16

## 2017-12-22 MED ORDER — SODIUM CHLORIDE 0.9 % IV SOLN
INTRAVENOUS | Status: DC
  Administered 2017-12-22 – 2017-12-23 (×4): via INTRAVENOUS

## 2017-12-22 MED ORDER — PANTOPRAZOLE SODIUM 40 MG PO TBEC
40.0000 mg | DELAYED_RELEASE_TABLET | Freq: Every day | ORAL | Status: DC
  Administered 2017-12-22 – 2017-12-23 (×2): 40 mg via ORAL
  Filled 2017-12-22 (×2): qty 1

## 2017-12-22 MED ORDER — HEPARIN SODIUM (PORCINE) 5000 UNIT/ML IJ SOLN
5000.0000 [IU] | Freq: Three times a day (TID) | INTRAMUSCULAR | Status: DC
  Administered 2017-12-22: 05:00:00 5000 [IU] via SUBCUTANEOUS
  Filled 2017-12-22: qty 1

## 2017-12-22 MED ORDER — ACETAMINOPHEN 650 MG RE SUPP: 650.0000 mg | Freq: Four times a day (QID) | RECTAL | Status: DC | PRN

## 2017-12-22 MED ORDER — ACETAMINOPHEN 325 MG PO TABS
650.0000 mg | ORAL_TABLET | Freq: Four times a day (QID) | ORAL | Status: DC | PRN
  Administered 2017-12-22: 20:00:00 650 mg via ORAL
  Filled 2017-12-22: qty 2

## 2017-12-22 MED ORDER — ADULT MULTIVITAMIN W/MINERALS CH
1.0000 | ORAL_TABLET | Freq: Every day | ORAL | Status: DC
  Administered 2017-12-22 – 2017-12-23 (×2): 1 via ORAL
  Filled 2017-12-22 (×2): qty 1

## 2017-12-22 MED ORDER — VITAMIN D 1000 UNITS PO TABS
1000.0000 [IU] | ORAL_TABLET | Freq: Every day | ORAL | Status: DC
  Administered 2017-12-22 – 2017-12-23 (×2): 1000 [IU] via ORAL
  Filled 2017-12-22 (×2): qty 1

## 2017-12-22 MED ORDER — POTASSIUM CHLORIDE 10 MEQ/100ML IV SOLN
10.0000 meq | INTRAVENOUS | Status: AC
  Administered 2017-12-22: 14:00:00 10 meq via INTRAVENOUS
  Filled 2017-12-22 (×2): qty 100

## 2017-12-22 MED ORDER — SODIUM CHLORIDE 0.9 % IV SOLN
3.0000 g | Freq: Four times a day (QID) | INTRAVENOUS | Status: DC
  Administered 2017-12-22 – 2017-12-23 (×6): 3 g via INTRAVENOUS
  Filled 2017-12-22 (×8): qty 3

## 2017-12-22 MED ORDER — DOCUSATE SODIUM 100 MG PO CAPS
100.0000 mg | ORAL_CAPSULE | Freq: Two times a day (BID) | ORAL | Status: DC
  Administered 2017-12-22 – 2017-12-23 (×2): 100 mg via ORAL
  Filled 2017-12-22 (×3): qty 1

## 2017-12-22 MED ORDER — POTASSIUM CHLORIDE 10 MEQ/100ML IV SOLN
10.0000 meq | Freq: Once | INTRAVENOUS | Status: AC
  Administered 2017-12-22: 19:00:00 10 meq via INTRAVENOUS
  Filled 2017-12-22: qty 100

## 2017-12-22 MED ORDER — TRAZODONE HCL 50 MG PO TABS: 25.0000 mg | ORAL_TABLET | Freq: Every evening | ORAL | Status: DC | PRN

## 2017-12-22 MED ORDER — BISACODYL 5 MG PO TBEC: 5.0000 mg | DELAYED_RELEASE_TABLET | Freq: Every day | ORAL | Status: DC | PRN

## 2017-12-22 MED ORDER — HYDROCODONE-ACETAMINOPHEN 5-325 MG PO TABS: 1.0000 | ORAL_TABLET | ORAL | Status: DC | PRN

## 2017-12-22 MED ORDER — ENOXAPARIN SODIUM 40 MG/0.4ML ~~LOC~~ SOLN
40.0000 mg | SUBCUTANEOUS | Status: DC
  Administered 2017-12-22: 23:00:00 40 mg via SUBCUTANEOUS
  Filled 2017-12-22: qty 0.4

## 2017-12-22 MED ORDER — ONDANSETRON HCL 4 MG/2ML IJ SOLN: 4.0000 mg | Freq: Four times a day (QID) | INTRAMUSCULAR | Status: DC | PRN

## 2017-12-22 MED ORDER — POTASSIUM CHLORIDE CRYS ER 20 MEQ PO TBCR
40.0000 meq | EXTENDED_RELEASE_TABLET | Freq: Every day | ORAL | Status: DC
  Administered 2017-12-22 – 2017-12-23 (×2): 40 meq via ORAL
  Filled 2017-12-22 (×2): qty 2

## 2017-12-22 MED ORDER — SERTRALINE HCL 50 MG PO TABS
100.0000 mg | ORAL_TABLET | Freq: Every day | ORAL | Status: DC
  Filled 2017-12-22: qty 2

## 2017-12-22 MED ORDER — POTASSIUM CHLORIDE 10 MEQ/100ML IV SOLN
10.0000 meq | INTRAVENOUS | Status: AC
  Administered 2017-12-22: 08:00:00 10 meq via INTRAVENOUS
  Filled 2017-12-22 (×2): qty 100

## 2017-12-22 NOTE — Progress Notes (Signed)
Pharmacy Antibiotic Note  Leah Santos is a 77 y.o. female admitted on 12/21/2017 with cellulitis.  Pharmacy has been consulted for Unasyn dosing.  Plan: Unasyn 3 grams q 6 hours ordered  Height: 5\' 3"  (160 cm) Weight: 180 lb (81.6 kg) IBW/kg (Calculated) : 52.4  Temp (24hrs), Avg:100.7 F (38.2 C), Min:99.2 F (37.3 C), Max:102.1 F (38.9 C)  Recent Labs  Lab 12/21/17 2002  WBC 12.3*  CREATININE 0.97  LATICACIDVEN 2.0*    Estimated Creatinine Clearance: 49.9 mL/min (by C-G formula based on SCr of 0.97 mg/dL).    Allergies  Allergen Reactions  . Statins Other (See Comments)    Muscle cramps  . Taxol [Paclitaxel]     Allergic reaction to Taxol ON 2nd treatment.  Taxol was discontinued and started on gemcitabine   . Sulfa Antibiotics Rash  . Tape Rash    Skin irritation with prolonged use (bandages with latex)    Antimicrobials this admission: Ceftriaxone x1; Unasyn 9/3  >>    >>   Dose adjustments this admission:   Microbiology results: 9/2 BCx: pendoing 9/2 UCx: pending        9/2 UA: (-)  Thank you for allowing pharmacy to be a part of this patient's care.  Leah Santos S 12/22/2017 12:29 AM

## 2017-12-22 NOTE — ED Notes (Signed)
Tephanie Escorcia RN helped the Pt to the bedside commode. Pt had difficulty ambulating to the commode. Pt voided.

## 2017-12-22 NOTE — Progress Notes (Signed)
[facility] NAME: Leah Santos    MR#:  [id number]  DATE OF BIRTH:  [birth year]  SUBJECTIVE:  CHIEF COMPLAINT:   Chief Complaint  Patient presents with  . Foot Pain    Came with foot swelling and pain. Still have same complains. Slight better, No fever or chills today.  REVIEW OF SYSTEMS:  CONSTITUTIONAL: No fever, fatigue or weakness.  EYES: No blurred or double vision.  EARS, NOSE, AND THROAT: No tinnitus or ear pain.  RESPIRATORY: No cough, shortness of breath, wheezing or hemoptysis.  CARDIOVASCULAR: No chest pain, orthopnea, edema.  GASTROINTESTINAL: No nausea, vomiting, diarrhea or abdominal pain.  GENITOURINARY: No dysuria, hematuria.  ENDOCRINE: No polyuria, nocturia,  HEMATOLOGY: No anemia, easy bruising or bleeding SKIN: No rash or lesion. MUSCULOSKELETAL: No joint pain or arthritis.   NEUROLOGIC: No tingling, numbness, weakness.  PSYCHIATRY: No anxiety or depression.   ROS  DRUG ALLERGIES:   Allergies  Allergen Reactions  . Statins Other (See Comments)    Muscle cramps  . Taxol [Paclitaxel]     Allergic reaction to Taxol ON 2nd treatment.  Taxol was discontinued and started on gemcitabine   . Sulfa Antibiotics Rash  . Tape Rash    Skin irritation with prolonged use (bandages with latex)    VITALS:  Blood pressure (!) 143/58, pulse 66, temperature 98.8 F (37.1 C), temperature source Oral, resp. rate 19, height 5\' 3"  (1.6 m), weight 81.6 kg, SpO2 94 %.  PHYSICAL EXAMINATION:  GENERAL:  77 y.o.-year-old patient lying in the bed with no acute distress.  EYES: Pupils equal, round, reactive to light and accommodation. No scleral icterus. Extraocular muscles intact.  HEENT: Head atraumatic, normocephalic. Oropharynx and nasopharynx clear.  NECK:  Supple, no jugular venous distention. No thyroid enlargement, no tenderness.  LUNGS: Normal breath sounds bilaterally, no wheezing, rales,rhonchi or crepitation. No  use of accessory muscles of respiration.  CARDIOVASCULAR: S1, S2 normal. No murmurs, rubs, or gallops.  ABDOMEN: Soft, nontender, nondistended. Bowel sounds present. No organomegaly or mass.  EXTREMITIES: No pedal edema, cyanosis, or clubbing. Right foot and ankle swelling and some redness. NEUROLOGIC: Cranial nerves II through XII are intact. Muscle strength 5/5 in all extremities. Sensation intact. Gait not checked.  PSYCHIATRIC: The patient is alert and oriented x 3.  SKIN: No obvious rash, lesion, or ulcer.   Physical Exam LABORATORY PANEL:   CBC Recent Labs  Lab 12/22/17 0412  WBC 10.0  HGB 13.3  HCT 36.8  PLT 158   ------------------------------------------------------------------------------------------------------------------  Chemistries  Recent Labs  Lab 12/21/17 2002 12/22/17 0412  NA 138 142  K 2.8* 2.8*  CL 98 101  CO2 30 29  GLUCOSE 197* 154*  BUN 17 15  CREATININE 0.97 0.92  CALCIUM 9.4 9.0  MG  --  1.8  1.9  AST 24  --   ALT 25  --   ALKPHOS 70  --   BILITOT 0.8  --    ------------------------------------------------------------------------------------------------------------------  Cardiac Enzymes Recent Labs  Lab 12/21/17 2002  TROPONINI <0.03   ------------------------------------------------------------------------------------------------------------------  RADIOLOGY:  Dg Chest 2 View  Result Date: 12/21/2017 CLINICAL DATA:  Fever EXAM: CHEST - 2 VIEW COMPARISON:  CT chest 08/26/2017 FINDINGS: No acute airspace disease or pleural effusion. Normal cardiomediastinal silhouette with aortic atherosclerosis. Degenerative changes of the spine. IMPRESSION: No active cardiopulmonary disease. Electronically Signed   By: Donavan Foil M.D.   On: 12/21/2017 21:04   US Venous Img Lower  Unilateral Right  Result Date: 12/21/2017 CLINICAL DATA:  Right foot edema and pain EXAM: Right LOWER EXTREMITY VENOUS DOPPLER ULTRASOUND TECHNIQUE: Gray-scale  sonography with graded compression, as well as color Doppler and duplex ultrasound were performed to evaluate the lower extremity deep venous systems from the level of the common femoral vein and including the common femoral, femoral, profunda femoral, popliteal and calf veins including the posterior tibial, peroneal and gastrocnemius veins when visible. The superficial great saphenous vein was also interrogated. Spectral Doppler was utilized to evaluate flow at rest and with distal augmentation maneuvers in the common femoral, femoral and popliteal veins. COMPARISON:  Radiograph 12/21/2017 FINDINGS: Contralateral Common Femoral Vein: Respiratory phasicity is normal and symmetric with the symptomatic side. No evidence of thrombus. Normal compressibility. Common Femoral Vein: No evidence of thrombus. Normal compressibility, respiratory phasicity and response to augmentation. Saphenofemoral Junction: No evidence of thrombus. Normal compressibility and flow on color Doppler imaging. Profunda Femoral Vein: No evidence of thrombus. Normal compressibility and flow on color Doppler imaging. Femoral Vein: No evidence of thrombus. Normal compressibility, respiratory phasicity and response to augmentation. Popliteal Vein: No evidence of thrombus. Normal compressibility, respiratory phasicity and response to augmentation. Calf Veins: No evidence of thrombus. Normal compressibility and flow on color Doppler imaging. Other Findings: Hypoechoic complex cyst in the popliteal fossa measuring 4.3 x 1 x 2.7 cm IMPRESSION: No evidence of deep venous thrombosis. 4.3 cm slightly complex popliteal fossa cyst. Electronically Signed   By: Donavan Foil M.D.   On: 12/21/2017 23:56   Dg Foot Complete Right  Result Date: 12/21/2017 CLINICAL DATA:  Pain swelling and redness to the right foot EXAM: RIGHT FOOT COMPLETE - 3+ VIEW COMPARISON:  05/19/2011 FINDINGS: No fracture or malalignment. No periostitis or bone destruction. Dorsal soft  tissue swelling without radiopaque foreign body or emphysema IMPRESSION: No acute osseous abnormality Electronically Signed   By: Donavan Foil M.D.   On: 12/21/2017 21:05    ASSESSMENT AND PLAN:   Active Problems:   Sepsis ([place])  1.  Sepsis, likely secondary to right foot cellulitis.   IV fluid resuscitation and broad-spectrum IV antibiotics.    blood cultures and follow lactic acid level.  No DVT. 2.  Acute right foot cellulitis.  IV Unasyn.   No DVT 3.  Hypokalemia.    replace potassium per protocol. 4.  Prediabetes.   on low carb diet and monitor blood sugars before meals and at bedtime.  Will use insulin treatment as needed to correct hyperglycemia.  All the records are reviewed and case discussed with Care Management/Social Workerr. Management plans discussed with the patient, family and they are in agreement.  CODE STATUS: Full.  TOTAL TIME TAKING CARE OF THIS PATIENT: 35 minutes.    POSSIBLE D/C IN 1-2 DAYS, DEPENDING ON CLINICAL CONDITION.   Vaughan Basta M.D on 12/22/2017   Between 7am to 6pm - Pager - [phone]  After 6pm go to [email or web address] - password [email or web address] [facility]  Office  [phone]  CC: Primary care physician; Rusty Aus, MD  Note: This dictation was prepared with Dragon dictation along with smaller phrase technology. Any transcriptional errors that result from this process are unintentional.

## 2017-12-22 NOTE — ED Notes (Signed)
Leah Doyle RN helped the Pt to the bedside commode. Pt had difficulty ambulating to the commode. Pt voided.

## 2017-12-22 NOTE — Plan of Care (Signed)
°  Problem: Education: °Goal: Knowledge of General Education information will improve °Description: Including pain rating scale, medication(s)/side effects and non-pharmacologic comfort measures °Outcome: Progressing °  °Problem: Health Behavior/Discharge Planning: °Goal: Ability to manage health-related needs will improve °Outcome: Progressing °  °Problem: Clinical Measurements: °Goal: Ability to maintain clinical measurements within normal limits will improve °Outcome: Progressing °  °Problem: Activity: °Goal: Risk for activity intolerance will decrease °Outcome: Progressing °  °Problem: Nutrition: °Goal: Adequate nutrition will be maintained °Outcome: Progressing °  °Problem: Safety: °Goal: Ability to remain free from injury will improve °Outcome: Progressing °  °

## 2017-12-22 NOTE — H&P (Signed)
[facility] NAME: Leah Santos    MR#:  [id number]  DATE OF BIRTH:  Sep 19, 1940  DATE OF ADMISSION:  12/21/2017  PRIMARY CARE PHYSICIAN: Rusty Aus, MD   REQUESTING/REFERRING PHYSICIAN:   CHIEF COMPLAINT:   Chief Complaint  Patient presents with  . Foot Pain    HISTORY OF PRESENT ILLNESS: Leah Santos  is a 77 y.o. female with a known history of asthma, HTN, prediabetes, IBS, Barrett's esophagus, cellulitis, chronic lymphedema and other comorbidities. Patient presented to emergency room for severe right foot pain, swelling and redness, going on for the past week, gradually getting worse.  She says the right foot feels hot and very tender especially with walking.  For the past 2 days patient has been extremely tired and with subjective fevers and chills.  She denies any recent trauma to her foot, no open wounds.  Patient has had right foot cellulitis in the past. Patient tells me that she has been under a lot of stress lately, she is the primary caregiver for her husband, who has severe dementia. At the arrival to emergency room patient was found to be febrile at 102.1.  Heart rate was 101 and respiratory rate 26. Blood test done emergency room are notable for elevated WBC at 12.3, elevated lactic acid at 2, low potassium level at 2.8. Right lower extremity venous Doppler is negative for DVT. Patient is admitted for further evaluation and treatment.  PAST MEDICAL HISTORY:   Past Medical History:  Diagnosis Date  . Asthma   . Barrett's esophagus   . Cellulitis    Right lower extremity  . Chronic acquired lymphedema    since childhood  . GERD (gastroesophageal reflux disease)   . History of cellulitis   . History of chemotherapy    finished total 6 cycles of chemotherapy(initially with carboplatinum Taxol followed by carboplatinum and gemcitabine(February, 2015)  . History of colon polyps   . History of colonoscopy  with polypectomy 11/07/13  . History of genital warts 1986  . History of mammogram 04/29/2014  . HTN (hypertension)   . Hypothyroidism   . IBS (irritable bowel syndrome)   . Ovarian cancer on left (HCC)    No BRCA mutation-Left ovarian cancer, adenocarcinoma.pT2 pNO  M0     PAST SURGICAL HISTORY:  Past Surgical History:  Procedure Laterality Date  . BREAST BIOPSY Right 1994   neg  . BREAST LUMPECTOMY  1962  . CHOLECYSTECTOMY  1996  . COLONOSCOPY W/ POLYPECTOMY  11/07/13  . ESOPHAGOGASTRODUODENOSCOPY ENDOSCOPY  11/07/13  . Exp. Laparotomy, TAH, Salpingo-oophorectomy, Appendectomy  12/2012  . [place]  . TUBAL LIGATION      SOCIAL HISTORY:  Social History   Tobacco Use  . Smoking status: Never Smoker  . Smokeless tobacco: Never Used  Substance Use Topics  . Alcohol use: No    Alcohol/week: 0.0 standard drinks    FAMILY HISTORY:  Family History  Problem Relation Age of Onset  . Ovarian cancer Mother 60  . Breast cancer Maternal Aunt   . Breast cancer Maternal Aunt 70  . Lung cancer Maternal Uncle 75  . Prostate cancer Maternal Uncle 65  . Uterine cancer Maternal Aunt 41    DRUG ALLERGIES:  Allergies  Allergen Reactions  . Statins Other (See Comments)    Muscle cramps  . Taxol [Paclitaxel]     Allergic reaction to Taxol ON 2nd treatment.  Taxol was discontinued and  started on gemcitabine   . Sulfa Antibiotics Rash  . Tape Rash    Skin irritation with prolonged use (bandages with latex)    REVIEW OF SYSTEMS:   CONSTITUTIONAL: Positive for fever, chills, fatigue and generalized weakness.  EYES: No changes in vision.  EARS, NOSE, AND THROAT: No tinnitus or ear pain.  RESPIRATORY: No cough, shortness of breath, wheezing or hemoptysis.  CARDIOVASCULAR: No chest pain, orthopnea, edema.  GASTROINTESTINAL: No nausea, vomiting, diarrhea or abdominal pain.  GENITOURINARY: No dysuria, hematuria.  ENDOCRINE: No polyuria, nocturia. HEMATOLOGY: No  bleeding. SKIN: No rash or lesion. MUSCULOSKELETAL: [place] for right foot pain and swelling.Marland Kitchen   NEUROLOGIC: No focal weakness.  PSYCHIATRY: No anxiety or depression.   MEDICATIONS AT HOME:  Prior to Admission medications   Medication Sig Start Date End Date Taking? Authorizing Provider  Cholecalciferol (VITAMIN D3) 1000 UNITS CAPS Take 1 tablet by mouth daily.    [provider]  fluticasone (FLONASE) 50 MCG/ACT nasal spray Place 2 sprays into both nostrils daily as needed for allergies or rhinitis.    [provider]  ibuprofen (ADVIL,MOTRIN) 200 MG tablet Take 200 mg by mouth every 6 (six) hours as needed.    [provider]  lansoprazole (PREVACID) 30 MG capsule Take 30 mg by mouth daily at 12 noon.  02/21/15   [provider]  levothyroxine (SYNTHROID, LEVOTHROID) 50 MCG tablet Take 75 mcg by mouth daily before breakfast.     [provider]  mometasone (NASONEX) 50 MCG/ACT nasal spray Place 2 sprays into the nose daily as needed.     [provider]  Multiple Vitamins-Minerals (CENTRUM SILVER ADULT 50+ PO) Take 1 tablet by mouth daily.    [provider]  potassium chloride (KLOR-CON 10) 10 MEQ tablet Take 4 tablets by mouth daily.  03/23/14   [provider]  sertraline (ZOLOFT) 100 MG tablet Take 1 tablet by mouth daily. 03/23/14   [provider]  triamterene-hydrochlorothiazide (MAXZIDE-25) 37.5-25 MG per tablet Take 1 tablet by mouth daily. 03/23/14   [provider]  venlafaxine XR (EFFEXOR-XR) 75 MG 24 hr capsule Take by mouth. 06/17/17 06/17/18  [provider]      PHYSICAL EXAMINATION:   VITAL SIGNS: Blood pressure 126/63, pulse 67, temperature 99.2 F (37.3 C), temperature source Oral, resp. rate 16, height '5\' 3"'$  (1.6 m), weight 81.6 kg, SpO2 99 %.  GENERAL:  77 y.o.-year-old patient lying in the bed with no acute distress.  EYES: Pupils equal, round, reactive to light and  accommodation. No scleral icterus. Extraocular muscles intact.  HEENT: Head atraumatic, normocephalic. Oropharynx and nasopharynx clear.  NECK:  Supple, no jugular venous distention. No thyroid enlargement, no tenderness.  LUNGS: Normal breath sounds bilaterally, no wheezing, rales,rhonchi or crepitation. No use of accessory muscles of respiration.  CARDIOVASCULAR: S1, S2 normal. No S3/S4.  ABDOMEN: Soft, nontender, nondistended. Bowel sounds present. No organomegaly or mass.  EXTREMITIES: Right foot is noted with erythema, edema and tenderness, worse at the dorsal and lateral area and spreading up to the ankle.  No fluctuance or wounds. NEUROLOGIC: No focal weakness. PSYCHIATRIC: The patient is alert and oriented x 3.  SKIN: No obvious lesion, or ulcer.  Right foot erythema is noted, as mentioned above.  LABORATORY PANEL:   CBC Recent Labs  Lab 12/21/17 2002  WBC 12.3*  HGB 13.8  HCT 38.9  PLT 183  MCV 88.5  MCH 31.3  MCHC 35.4  RDW 14.2  LYMPHSABS 2.2  MONOABS 0.7  EOSABS 0.2  BASOSABS 0.1   ------------------------------------------------------------------------------------------------------------------  Chemistries  Recent Labs  Lab 12/21/17 2002  NA 138  K 2.8*  CL 98  CO2 30  GLUCOSE 197*  BUN 17  CREATININE 0.97  CALCIUM 9.4  AST 24  ALT 25  ALKPHOS 70  BILITOT 0.8   ------------------------------------------------------------------------------------------------------------------ estimated creatinine clearance is 49.9 mL/min (by C-G formula based on SCr of 0.97 mg/dL). ------------------------------------------------------------------------------------------------------------------ No results for input(s): TSH, T4TOTAL, T3FREE, THYROIDAB in the last 72 hours.  Invalid input(s): FREET3   Coagulation profile No results for input(s): INR, PROTIME in the last 168  hours. ------------------------------------------------------------------------------------------------------------------- No results for input(s): DDIMER in the last 72 hours. -------------------------------------------------------------------------------------------------------------------  Cardiac Enzymes Recent Labs  Lab 12/21/17 2002  TROPONINI <0.03   ------------------------------------------------------------------------------------------------------------------ Invalid input(s): POCBNP  ---------------------------------------------------------------------------------------------------------------  Urinalysis    Component Value Date/Time   COLORURINE YELLOW (A) 12/21/2017 2127   APPEARANCEUR CLEAR (A) 12/21/2017 2127   APPEARANCEUR Clear 11/08/2013 1250   LABSPEC 1.015 12/21/2017 2127   LABSPEC 1.015 11/08/2013 1250   PHURINE 7.0 12/21/2017 2127   GLUCOSEU NEGATIVE 12/21/2017 2127   GLUCOSEU Negative 11/08/2013 1250   HGBUR NEGATIVE 12/21/2017 2127   BILIRUBINUR NEGATIVE 12/21/2017 2127   BILIRUBINUR Negative 11/08/2013 1250   KETONESUR NEGATIVE 12/21/2017 2127   PROTEINUR NEGATIVE 12/21/2017 2127   NITRITE NEGATIVE 12/21/2017 2127   LEUKOCYTESUR NEGATIVE 12/21/2017 2127   LEUKOCYTESUR 1+ 11/08/2013 1250     RADIOLOGY: Dg Chest 2 View  Result Date: 12/21/2017 CLINICAL DATA:  Fever EXAM: CHEST - 2 VIEW COMPARISON:  CT chest 08/26/2017 FINDINGS: No acute airspace disease or pleural effusion. Normal cardiomediastinal silhouette with aortic atherosclerosis. Degenerative changes of the spine. IMPRESSION: No active cardiopulmonary disease. Electronically Signed   By: Donavan Foil M.D.   On: 12/21/2017 21:04   US Venous Img Lower Unilateral Right  Result Date: 12/21/2017 CLINICAL DATA:  Right foot edema and pain EXAM: Right LOWER EXTREMITY VENOUS DOPPLER ULTRASOUND TECHNIQUE: Gray-scale sonography with graded compression, as well as color Doppler and duplex ultrasound  were performed to evaluate the lower extremity deep venous systems from the level of the common femoral vein and including the common femoral, femoral, profunda femoral, popliteal and calf veins including the posterior tibial, peroneal and gastrocnemius veins when visible. The superficial great saphenous vein was also interrogated. Spectral Doppler was utilized to evaluate flow at rest and with distal augmentation maneuvers in the common femoral, femoral and popliteal veins. COMPARISON:  Radiograph 12/21/2017 FINDINGS: Contralateral Common Femoral Vein: Respiratory phasicity is normal and symmetric with the symptomatic side. No evidence of thrombus. Normal compressibility. Common Femoral Vein: No evidence of thrombus. Normal compressibility, respiratory phasicity and response to augmentation. Saphenofemoral Junction: No evidence of thrombus. Normal compressibility and flow on color Doppler imaging. Profunda Femoral Vein: No evidence of thrombus. Normal compressibility and flow on color Doppler imaging. Femoral Vein: No evidence of thrombus. Normal compressibility, respiratory phasicity and response to augmentation. Popliteal Vein: No evidence of thrombus. Normal compressibility, respiratory phasicity and response to augmentation. Calf Veins: No evidence of thrombus. Normal compressibility and flow on color Doppler imaging. Other Findings: Hypoechoic complex cyst in the popliteal fossa measuring 4.3 x 1 x 2.7 cm IMPRESSION: No evidence of deep venous thrombosis. 4.3 cm slightly complex popliteal fossa cyst. Electronically Signed   By: Donavan Foil M.D.   On: 12/21/2017 23:56   Dg Foot Complete Right  Result Date: 12/21/2017 CLINICAL DATA:  Pain swelling and redness to the right foot EXAM:  RIGHT FOOT COMPLETE - 3+ VIEW COMPARISON:  05/19/2011 FINDINGS: No fracture or malalignment. No periostitis or bone destruction. Dorsal soft tissue swelling without radiopaque foreign body or emphysema IMPRESSION: No acute  osseous abnormality Electronically Signed   By: Donavan Foil M.D.   On: 12/21/2017 21:05    EKG: Orders placed or performed during the hospital encounter of 12/21/17  . ED EKG 12-Lead  . EKG 12-Lead  . EKG 12-Lead  . ED EKG 12-Lead    IMPRESSION AND PLAN:  1.  Sepsis, likely secondary to right foot cellulitis.  Will start IV fluid resuscitation and broad-spectrum IV antibiotics.  Will check blood cultures and follow lactic acid level. 2.  Acute right foot cellulitis.  Will start treatment with IV Unasyn. 3.  Hypokalemia.  Will replace potassium per protocol. 4.  Prediabetes.  We will start patient on low carb diet and monitor blood sugars before meals and at bedtime.  Will use insulin treatment as needed to correct hyperglycemia.  All the records are reviewed and case discussed with ED provider. Management plans discussed with the patient, who is in agreement.  CODE STATUS: Full Advance Directive Documentation     Most Recent Value  Type of Advance Directive  Living will  Pre-existing out of facility DNR order (yellow form or pink MOST form)  -  "MOST" Form in Place?  -       TOTAL TIME TAKING CARE OF THIS PATIENT: 45 minutes.    Amelia Jo M.D on 12/22/2017 at 12:04 AM  Between 7am to 6pm - Pager - [phone]  After 6pm go to [email or web address] - password [email or web address] [facility] [place] [facility]  [phone]  CC: Primary care physician; Rusty Aus, MD

## 2017-12-22 NOTE — Discharge Planning (Signed)
While conducting patient's PTA medication history, she reported having difficulty keeping her own medications in order and taking them appropriately. She cares for her husband who suffers from Alzheimer's and she repeatedly said that she spent so much time and effort tending to his needs (e.g. Medication) that she sometimes neglected her own.   Upon discharge, recommend providing information on programs or local pharmacies that offer daily/weekly/monthly dose packaging or other medication adherence services.  ** The above is intended solely for informational and/or communicative purposes. It should in no way be considered an endorsement of any specific treatment, therapy or action. **

## 2017-12-22 NOTE — Progress Notes (Signed)
Electrolyte replacement consult  9/3 AM K+ 2.8. 40 mEq KCl IV ordered. Recheck BMP with AM labs. Patient also receiving 40 mEq KCl PO daily.  Sim Boast, PharmD, BCPS  12/22/17 5:32 AM

## 2017-12-23 LAB — BASIC METABOLIC PANEL
Anion gap: 8 (ref 5–15)
BUN: 17 mg/dL (ref 8–23)
CO2: 28 mmol/L (ref 22–32)
CREATININE: 0.81 mg/dL (ref 0.44–1.00)
Calcium: 8.6 mg/dL — ABNORMAL LOW (ref 8.9–10.3)
Chloride: 108 mmol/L (ref 98–111)
GFR calc Af Amer: >60 mL/min (ref >60)
GFR calc non Af Amer: >60 mL/min (ref >60)
GLUCOSE: 142 mg/dL — AB (ref 70–99)
Potassium: 3.4 mmol/L — ABNORMAL LOW (ref 3.5–5.1)
Sodium: 144 mmol/L (ref 135–145)

## 2017-12-23 LAB — URINE CULTURE: Culture: <10000 — AB

## 2017-12-23 LAB — GLUCOSE, CAPILLARY
GLUCOSE-CAPILLARY: 132 mg/dL — AB (ref 70–99)
GLUCOSE-CAPILLARY: 139 mg/dL — AB (ref 70–99)

## 2017-12-23 MED ORDER — HYDROCODONE-ACETAMINOPHEN 5-325 MG PO TABS: 1.0000 | ORAL_TABLET | Freq: Four times a day (QID) | ORAL | 0 refills | Status: DC | PRN

## 2017-12-23 MED ORDER — AMOXICILLIN-POT CLAVULANATE 875-125 MG PO TABS: 1.0000 | ORAL_TABLET | Freq: Two times a day (BID) | ORAL | 0 refills | Status: AC

## 2017-12-23 NOTE — Progress Notes (Signed)
Patient discharged home on self. Discharge instruction given to patient. All questions answered and concerns addressed. Patient  Home meds returned to patient.Patient verbalized understanding.

## 2017-12-23 NOTE — Progress Notes (Signed)
Family Meeting Note  Advance Directive:no  Today a meeting took place with the Patient.   The following clinical team members were present during this meeting:MD  The following were discussed:Patient's diagnosis: cellulitis, hypertension , diabetes, Patient's progosis: Unable to determine and Goals for treatment: DNR  Additional follow-up to be provided: PMD  Time spent during discussion:20 minutes  Vaughan Basta, MD

## 2017-12-23 NOTE — Plan of Care (Signed)

## 2017-12-23 NOTE — Discharge Summary (Signed)
[name] NAME: Leah Santos    MR#:  [id number]  DATE OF BIRTH:  Oct 07, 1940  DATE OF ADMISSION:  12/21/2017 ADMITTING PHYSICIAN: Amelia Jo, MD  DATE OF DISCHARGE: 12/23/2017  1:28 PM  PRIMARY CARE PHYSICIAN: Rusty Aus, MD    ADMISSION DIAGNOSIS:  Cellulitis of right lower extremity [L03.115] Sepsis, due to unspecified organism ([place]) [A41.9]  DISCHARGE DIAGNOSIS:  Active Problems:   Sepsis ([place])   SECONDARY DIAGNOSIS:   Past Medical History:  Diagnosis Date  . Asthma   . Barrett's esophagus   . Cellulitis    Right lower extremity  . Chronic acquired lymphedema    since childhood  . GERD (gastroesophageal reflux disease)   . History of cellulitis   . History of chemotherapy    finished total 6 cycles of chemotherapy(initially with carboplatinum Taxol followed by carboplatinum and gemcitabine(February, 2015)  . History of colon polyps   . History of colonoscopy with polypectomy 11/07/13  . History of genital warts 1986  . History of mammogram 04/29/2014  . HTN (hypertension)   . Hypothyroidism   . IBS (irritable bowel syndrome)   . Ovarian cancer on left (HCC)    No BRCA mutation-Left ovarian cancer, adenocarcinoma.pT2 pNO  M0     HOSPITAL COURSE:   1.Sepsis, likely secondary to right foot cellulitis. IV fluid resuscitation and broad-spectrum IV antibiotics.  blood cultures and follow lactic acid level.  No DVT. 2.Acute right foot cellulitis. IV Unasyn.   No DVT   Much improved, able to walk without much difficulty- discharge home with oral augmentin. 3.Hypokalemia.  replace potassium per protocol. 4.Prediabetes. on low carb diet and monitor blood sugars before meals and at bedtime.Will use insulin treatment as needed to correct hyperglycemia.   DISCHARGE CONDITIONS:   Stable.  CONSULTS OBTAINED:    DRUG ALLERGIES:   Allergies  Allergen Reactions  . Statins Other  (See Comments)    Muscle cramps  . Taxol [Paclitaxel]     Allergic reaction to Taxol ON 2nd treatment.  Taxol was discontinued and started on gemcitabine   . Sulfa Antibiotics Rash  . Tape Rash    Skin irritation with prolonged use (bandages with latex)    DISCHARGE MEDICATIONS:   Allergies as of 12/23/2017      Reactions   Statins Other (See Comments)   Muscle cramps   Taxol [paclitaxel]    Allergic reaction to Taxol ON 2nd treatment.  Taxol was discontinued and started on gemcitabine   Sulfa Antibiotics Rash   Tape Rash   Skin irritation with prolonged use (bandages with latex)      Medication List    TAKE these medications   amoxicillin-clavulanate 875-125 MG tablet Commonly known as:  AUGMENTIN Take 1 tablet by mouth 2 (two) times daily for 7 days.   CENTRUM SILVER ADULT 50+ PO Take 1 tablet by mouth daily.   cycloSPORINE 0.05 % ophthalmic emulsion Commonly known as:  RESTASIS Place 1 drop into both eyes 2 (two) times daily.   escitalopram 10 MG tablet Commonly known as:  LEXAPRO Take 5-10 mg by mouth daily.   fluticasone 50 MCG/ACT nasal spray Commonly known as:  FLONASE Place 2 sprays into both nostrils daily as needed for allergies or rhinitis.   HYDROcodone-acetaminophen 5-325 MG tablet Commonly known as:  NORCO/VICODIN Take 1 tablet by mouth every 6 (six) hours as needed for moderate pain.   ibuprofen 200 MG tablet Commonly known as:  ADVIL,MOTRIN Take 200-400 mg by mouth every 6 (six) hours as needed for fever, mild pain or moderate pain.   KLOR-CON 10 10 MEQ tablet Generic drug:  potassium chloride Take 40 mEq by mouth daily.   lansoprazole 30 MG capsule Commonly known as:  PREVACID Take 30 mg by mouth daily at 12 noon.   levothyroxine 50 MCG tablet Commonly known as:  SYNTHROID, LEVOTHROID Take 75 mcg by mouth daily before breakfast.   triamterene-hydrochlorothiazide 37.5-25 MG tablet Commonly known as:  MAXZIDE-25 Take 1 tablet by mouth  daily.   Vitamin D3 1000 units Caps Take 1 tablet by mouth daily.        DISCHARGE INSTRUCTIONS:    Follow with PMD in 1-2 weeks.  If you experience worsening of your admission symptoms, develop shortness of breath, life threatening emergency, suicidal or homicidal thoughts you must seek medical attention immediately by calling 911 or calling your MD immediately  if symptoms less severe.  You Must read complete instructions/literature along with all the possible adverse reactions/side effects for all the Medicines you take and that have been prescribed to you. Take any new Medicines after you have completely understood and accept all the possible adverse reactions/side effects.   Please note  You were cared for by a hospitalist during your hospital stay. If you have any questions about your discharge medications or the care you received while you were in the hospital after you are discharged, you can call the unit and asked to speak with the hospitalist on call if the hospitalist that took care of you is not available. Once you are discharged, your primary care physician will handle any further medical issues. Please note that NO REFILLS for any discharge medications will be authorized once you are discharged, as it is imperative that you return to your primary care physician (or establish a relationship with a primary care physician if you do not have one) for your aftercare needs so that they can reassess your need for medications and monitor your lab values.    Today   CHIEF COMPLAINT:   Chief Complaint  Patient presents with  . Foot Pain    HISTORY OF PRESENT ILLNESS:  Leah Santos  is a 77 y.o. female with a known history of asthma, HTN, prediabetes, IBS, Barrett's esophagus, cellulitis, chronic lymphedema and other comorbidities. Patient presented to emergency room for severe right foot pain, swelling and redness, going on for the past week, gradually getting worse.  She  says the right foot feels hot and very tender especially with walking.  For the past 2 days patient has been extremely tired and with subjective fevers and chills.  She denies any recent trauma to her foot, no open wounds.  Patient has had right foot cellulitis in the past. Patient tells me that she has been under a lot of stress lately, she is the primary caregiver for her husband, who has severe dementia. At the arrival to emergency room patient was found to be febrile at 102.1.  Heart rate was 101 and respiratory rate 26. Blood test done emergency room are notable for elevated WBC at 12.3, elevated lactic acid at 2, low potassium level at 2.8. Right lower extremity venous Doppler is negative for DVT. Patient is admitted for further evaluation and treatment.  VITAL SIGNS:  Blood pressure (!) 116/31, pulse 64, temperature 98.5 F (36.9 C), temperature source Oral, resp. rate 18, height '5\' 3"'$  (1.6 m), weight 87.2 kg, SpO2 100 %.  I/O:  Intake/Output Summary (Last 24 hours) at 12/23/2017 2119 Last data filed at 12/23/2017 1314 Gross per 24 hour  Intake 2906.62 ml  Output -  Net 2906.62 ml    PHYSICAL EXAMINATION:  GENERAL:  77 y.o.-year-old patient lying in the bed with no acute distress.  EYES: Pupils equal, round, reactive to light and accommodation. No scleral icterus. Extraocular muscles intact.  HEENT: Head atraumatic, normocephalic. Oropharynx and nasopharynx clear.  NECK:  Supple, no jugular venous distention. No thyroid enlargement, no tenderness.  LUNGS: Normal breath sounds bilaterally, no wheezing, rales,rhonchi or crepitation. No use of accessory muscles of respiration.  CARDIOVASCULAR: S1, S2 normal. No murmurs, rubs, or gallops.  ABDOMEN: Soft, non-tender, non-distended. Bowel sounds present. No organomegaly or mass.  EXTREMITIES: No pedal edema, cyanosis, or clubbing.  NEUROLOGIC: Cranial nerves II through XII are intact. Muscle strength 5/5 in all extremities. Sensation  intact. Gait not checked.  PSYCHIATRIC: The patient is alert and oriented x 3.  SKIN: No obvious rash, lesion, or ulcer.   DATA REVIEW:   CBC Recent Labs  Lab 12/22/17 0412  WBC 10.0  HGB 13.3  HCT 36.8  PLT 158    Chemistries  Recent Labs  Lab 12/21/17 2002 12/22/17 0412 12/23/17 0812  NA 138 142 144  K 2.8* 2.8* 3.4*  CL 98 101 108  CO2 '30 29 28  '$ GLUCOSE 197* 154* 142*  BUN '17 15 17  '$ CREATININE 0.97 0.92 0.81  CALCIUM 9.4 9.0 8.6*  MG  --  1.8  1.9  --   AST 24  --   --   ALT 25  --   --   ALKPHOS 70  --   --   BILITOT 0.8  --   --     Cardiac Enzymes Recent Labs  Lab 12/21/17 2002  TROPONINI <0.03    Microbiology Results  Results for orders placed or performed during the hospital encounter of 12/21/17  Blood Culture (routine x 2)     Status: None (Preliminary result)   Collection Time: 12/21/17  8:03 PM  Result Value Ref Range Status   Specimen Description BLOOD LEFT HAND  Final   Special Requests   Final    BOTTLES DRAWN AEROBIC AND ANAEROBIC Blood Culture results may not be optimal due to an excessive volume of blood received in culture bottles   Culture   Final    NO GROWTH 2 DAYS Performed at [facility], [address] [place], [place] [address]    Report Status PENDING  Incomplete  Blood Culture (routine x 2)     Status: None (Preliminary result)   Collection Time: 12/21/17  8:03 PM  Result Value Ref Range Status   Specimen Description BLOOD LEFT ANTECUBITAL  Final   Special Requests   Final    BOTTLES DRAWN AEROBIC AND ANAEROBIC Blood Culture results may not be optimal due to an excessive volume of blood received in culture bottles   Culture   Final    NO GROWTH 2 DAYS Performed at [facility], [address], [place], [place] [address]    Report Status PENDING  Incomplete  Urine culture     Status: Abnormal   Collection Time: 12/21/17  9:27 PM  Result Value Ref Range Status   Specimen Description   Final     URINE, RANDOM Performed at [facility], [address] [place], [place] [address]    Special Requests   Final    NONE Performed at  [facility] [address] [place], [place] [address]    Culture (A)  Final    <10,000 COLONIES/mL INSIGNIFICANT GROWTH Performed at [facility] [address] [place], [place] [address]    Report Status 12/23/2017 FINAL  Final    RADIOLOGY:  US Venous Img Lower Unilateral Right  Result Date: 12/21/2017 CLINICAL DATA:  Right foot edema and pain EXAM: Right LOWER EXTREMITY VENOUS DOPPLER ULTRASOUND TECHNIQUE: Gray-scale sonography with graded compression, as well as color Doppler and duplex ultrasound were performed to evaluate the lower extremity deep venous systems from the level of the common femoral vein and including the common femoral, femoral, profunda femoral, popliteal and calf veins including the posterior tibial, peroneal and gastrocnemius veins when visible. The superficial great saphenous vein was also interrogated. Spectral Doppler was utilized to evaluate flow at rest and with distal augmentation maneuvers in the common femoral, femoral and popliteal veins. COMPARISON:  Radiograph 12/21/2017 FINDINGS: Contralateral Common Femoral Vein: Respiratory phasicity is normal and symmetric with the symptomatic side. No evidence of thrombus. Normal compressibility. Common Femoral Vein: No evidence of thrombus. Normal compressibility, respiratory phasicity and response to augmentation. Saphenofemoral Junction: No evidence of thrombus. Normal compressibility and flow on color Doppler imaging. Profunda Femoral Vein: No evidence of thrombus. Normal compressibility and flow on color Doppler imaging. Femoral Vein: No evidence of thrombus. Normal compressibility, respiratory phasicity and response to augmentation. Popliteal Vein: No evidence of thrombus. Normal compressibility, respiratory phasicity and response to augmentation. Calf Veins:  No evidence of thrombus. Normal compressibility and flow on color Doppler imaging. Other Findings: Hypoechoic complex cyst in the popliteal fossa measuring 4.3 x 1 x 2.7 cm IMPRESSION: No evidence of deep venous thrombosis. 4.3 cm slightly complex popliteal fossa cyst. Electronically Signed   By: Donavan Foil M.D.   On: 12/21/2017 23:56    EKG:   Orders placed or performed during the hospital encounter of 12/21/17  . ED EKG 12-Lead  . EKG 12-Lead  . EKG 12-Lead  . ED EKG 12-Lead      Management plans discussed with the patient, family and they are in agreement.  CODE STATUS:  Code Status History    Date Active Date Inactive Code Status Order ID Comments User Context   12/22/2017 0113 12/23/2017 1636 Full Code [id number]  Amelia Jo, MD ED    Advance Directive Documentation     Most Recent Value  Type of Advance Directive  Living will  Pre-existing out of facility DNR order (yellow form or pink MOST form)  -  "MOST" Form in Place?  -      TOTAL TIME TAKING CARE OF THIS PATIENT: 35 minutes.    Vaughan Basta M.D on 12/23/2017 at 9:19 PM  Between 7am to 6pm - Pager - [phone]  After 6pm go to [email or web address] - password [email or web address] [facility]  Office  [phone]  CC: Primary care physician; Rusty Aus, MD   Note: This dictation was prepared with Dragon dictation along with smaller phrase technology. Any transcriptional errors that result from this process are unintentional.

## 2017-12-26 LAB — CULTURE, BLOOD (ROUTINE X 2)
Culture: NO GROWTH
Culture: NO GROWTH

## 2018-03-17 ENCOUNTER — Ambulatory Visit: Payer: Medicare Other

## 2018-04-28 ENCOUNTER — Inpatient Hospital Stay: Payer: Medicare Other | Attending: Internal Medicine | Admitting: Obstetrics and Gynecology

## 2018-04-28 ENCOUNTER — Other Ambulatory Visit: Payer: Self-pay

## 2018-04-28 ENCOUNTER — Other Ambulatory Visit: Payer: Medicare Other

## 2018-04-28 VITALS — BP 127/79 | HR 67 | Temp 98.4°F | Resp 20 | Ht 63.0 in | Wt 181.6 lb

## 2018-04-28 DIAGNOSIS — Z90710 Acquired absence of both cervix and uterus: Secondary | ICD-10-CM | POA: Diagnosis not present

## 2018-04-28 DIAGNOSIS — Z08 Encounter for follow-up examination after completed treatment for malignant neoplasm: Secondary | ICD-10-CM

## 2018-04-28 DIAGNOSIS — Z8543 Personal history of malignant neoplasm of ovary: Secondary | ICD-10-CM | POA: Diagnosis not present

## 2018-04-28 DIAGNOSIS — Z90722 Acquired absence of ovaries, bilateral: Secondary | ICD-10-CM | POA: Diagnosis not present

## 2018-04-28 DIAGNOSIS — B373 Candidiasis of vulva and vagina: Secondary | ICD-10-CM

## 2018-04-28 DIAGNOSIS — B3731 Acute candidiasis of vulva and vagina: Secondary | ICD-10-CM

## 2018-04-28 DIAGNOSIS — Z9221 Personal history of antineoplastic chemotherapy: Secondary | ICD-10-CM | POA: Diagnosis not present

## 2018-04-28 MED ORDER — FLUCONAZOLE 150 MG PO TABS: ORAL_TABLET | ORAL | 0 refills | Status: DC

## 2018-04-28 NOTE — Progress Notes (Signed)
Gynecologic Oncology Interval Visit   Primary Care Provider: Rusty Aus, MD [place] [place], [place] [address] [phone]  Referring MD: Dr. Ferne Reus (no longer here). Rusty Aus, MD as PCP - General (Internal Medicine)  Cammie Sickle, MD - Oncology  Chief Concern: Ovarian Cancer surveillance  Subjective:  Leah Santos is a 78 y.o. female who returns to clinic today for ovarian cancer surveillance. who is seen for ovarian cancer surveillance.   She was last seen by Dr. Rogue Bussing on 08/19/2017 with a negative exam and normal CA125.  She was complaining of abdominal distention and bloating at that time which prompted imaging.    08/26/2017- CT Chest/Abdomen/Pelvis W Contrast IMPRESSION: 1. Stable hepatic lesions are noted compared to prior exam. 2. Bilateral cystic adnexal abnormalities noted on prior exam are no longer present. 3. Possible fatty infiltration of the liver. 4. No acute abnormality is seen in the abdomen or pelvis. 5. Aortic Atherosclerosis (ICD10-I70.0).  CA 125 08/19/17  16.5 03/11/2017 18.3 08/20/2016  15.6  Today, she reports hospitalization for sepsis few months ago due to foot infection treated with antibiotics and subsequent yeast infection.  She has been self treating with Monistat at home.  She continues to note some vulvar irritation and discharge.  She reports seeming to retain more fluid lately and reports poor diet over the holidays.  2 pound weight gain since May 2019 visit.  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   Gynecologic Oncology History:  Leah Santos was diagnosed with left ovarian cancer, adenocarcinoma.pT2 pNO  M0, FIGOSTAGING II B. Status post bilateral oopherectomy, omentectomy, lymph node dissection on December 21, 2012.  CA 125 01/11/2013 87.1  (elevated)  CA-19-9 01/11/2013 62 (elevated)  CEA 01/11/2013 1.0 (normal)  -Started on carboplatinum and Taxol from October, 2014. -Allergic reaction to Taxol on 2nd treatment.  Taxol was discontinued and started on gemcitabine -Patient has finished total 6 cycles of chemotherapy(initially with carboplatinum Taxol followed by carboplatinum and gemcitabine(February, 2015).  Pelvic MRI 11/21/2013  FINDINGS: Previous hysterectomy noted. Simple appearing cyst in the right adnexa shows significant decrease in size since previous study, currently measuring 1.2 x 2.5 cm on image 23 of series 9 compared to 4.5 x 5.9 cm previously. This is consistent with a resolving postoperative fluid collection such as a lymphocele or seroma. Another simple appearing cyst in the left adnexa measures 3.0 x 4.4 cm. This remains stable in size and appearance since prior exam, and is also suspicious for a postoperative fluid collection. No internal septations or solid mural nodules are visualized. IMPRESSION: 1. No acute findings or radiographic signs of pelvic metastatic disease. 2. Near complete resolution of right adnexal fluid collection, consistent with resolving postoperative lymphocele or seroma. 3. Stable simple appearing left adnexal cyst, most consistent with persistent postoperative lymphocele or seroma.  Genetic testing: No BRCA mutation  Health care maintenance: Mammogram: 08/03/2015 BI-RADS CATEGORY  1: Negative.  Dr. Sabra Heck is doing her breast cancer screening and the patient will follow up with him.  She has been diagnosed with DM now and is on diet control. She is trying to lose weight.   Problem List: Patient Active Problem List   Diagnosis Date Noted  . Sepsis ([place]) 12/21/2017  . Ovarian cancer, right ([place]) 01/24/2016  . Ovarian cancer ([place]) 10/24/2015  . Avitaminosis D 02/21/2015  . Acid  reflux 12/27/2014  . History of colon polyps 12/27/2014  . HLD (hyperlipidemia) 12/27/2014  . BP (high  blood pressure) 12/27/2014  . Obstructive apnea 12/27/2014  . H/O ovarian cancer 02/16/2014  . Acquired hypothyroidism 02/16/2014    Past Medical History: Past Medical History:  Diagnosis Date  . Asthma   . Barrett's esophagus   . Cellulitis    Right lower extremity  . Chronic acquired lymphedema    since childhood  . GERD (gastroesophageal reflux disease)   . History of cellulitis   . History of chemotherapy    finished total 6 cycles of chemotherapy(initially with carboplatinum Taxol followed by carboplatinum and gemcitabine(February, 2015)  . History of colon polyps   . History of colonoscopy with polypectomy 11/07/13  . History of genital warts 1986  . History of mammogram 04/29/2014  . HTN (hypertension)   . Hypothyroidism   . IBS (irritable bowel syndrome)   . Ovarian cancer on left (HCC)    No BRCA mutation-Left ovarian cancer, adenocarcinoma.pT2 pNO  M0     Past Surgical History: Past Surgical History:  Procedure Laterality Date  . BREAST BIOPSY Right 1994   neg  . BREAST LUMPECTOMY  1962  . CHOLECYSTECTOMY  1996  . COLONOSCOPY W/ POLYPECTOMY  11/07/13  . ESOPHAGOGASTRODUODENOSCOPY ENDOSCOPY  11/07/13  . Exp. Laparotomy, TAH, Salpingo-oophorectomy, Appendectomy  12/2012  . [place]  . TUBAL LIGATION      Past Gynecologic History:  As per HPI  OB History:  OB History  Gravida Para Term Preterm AB Living  _0 SAB TAB Ectopic Multiple Live Births               # Outcome Date GA Lbr Len/2nd Weight Sex Delivery Anes PTL Lv  3 Term           2 Term           1 Term             Obstetric Comments  Onset of menses at age 35    Family History: Family History  Problem Relation Age of Onset  . Ovarian cancer Mother 20  . Breast cancer Maternal Aunt   . Breast cancer Maternal Aunt 70  . Lung cancer Maternal Uncle 75  . Prostate cancer Maternal Uncle 65  . Uterine cancer Maternal Aunt 41    Social History: Social History    Socioeconomic History  . Marital status: Married    Spouse name: Not on file  . Number of children: Not on file  . Years of education: Not on file  . Highest education level: Not on file  Occupational History  . Not on file  Social Needs  . Financial resource strain: Not on file  . Food insecurity:    Worry: Not on file    Inability: Not on file  . Transportation needs:    Medical: Not on file    Non-medical: Not on file  Tobacco Use  . Smoking status: Never Smoker  . Smokeless tobacco: Never Used  Substance and Sexual Activity  . Alcohol use: No    Alcohol/week: 0.0 standard drinks  . Drug use: No  . Sexual activity: Not on file  Lifestyle  . Physical activity:    Days per week: Not on file    Minutes per session: Not on file  . Stress: Not on file  Relationships  . Social connections:  Talks on phone: Not on file    Gets together: Not on file    Attends religious service: Not on file    Active member of club or organization: Not on file    Attends meetings of clubs or organizations: Not on file    Relationship status: Not on file  . Intimate partner violence:    Fear of current or ex partner: Not on file    Emotionally abused: Not on file    Physically abused: Not on file    Forced sexual activity: Not on file  Other Topics Concern  . Not on file  Social History Narrative  . Not on file    Allergies: Allergies  Allergen Reactions  . Statins Other (See Comments)    Muscle cramps  . Taxol [Paclitaxel]     Allergic reaction to Taxol ON 2nd treatment.  Taxol was discontinued and started on gemcitabine   . Sulfa Antibiotics Rash  . Tape Rash    Skin irritation with prolonged use (bandages with latex)    Current Medications: Current Outpatient Medications  Medication Sig Dispense Refill  . Cholecalciferol (VITAMIN D3) 1000 UNITS CAPS Take 1 tablet by mouth daily.    . cycloSPORINE (RESTASIS) 0.05 % ophthalmic emulsion Place 1 drop into both eyes 2  (two) times daily.    Marland Kitchen escitalopram (LEXAPRO) 10 MG tablet Take 5-10 mg by mouth daily.    . fluticasone (FLONASE) 50 MCG/ACT nasal spray Place 2 sprays into both nostrils daily as needed for allergies or rhinitis.    Marland Kitchen ibuprofen (ADVIL,MOTRIN) 200 MG tablet Take 200-400 mg by mouth every 6 (six) hours as needed for fever, mild pain or moderate pain.     Marland Kitchen levothyroxine (SYNTHROID, LEVOTHROID) 50 MCG tablet Take 75 mcg by mouth daily before breakfast.     . Multiple Vitamins-Minerals (CENTRUM SILVER ADULT 50+ PO) Take 1 tablet by mouth daily.    . potassium chloride (KLOR-CON 10) 10 MEQ tablet Take 40 mEq by mouth daily.     Marland Kitchen triamterene-hydrochlorothiazide (MAXZIDE-25) 37.5-25 MG per tablet Take 1 tablet by mouth daily.     No current facility-administered medications for this visit.    Facility-Administered Medications Ordered in Other Visits  Medication Dose Route Frequency Provider Last Rate Last Dose  . heparin lock flush 100 unit/mL  500 Units Intravenous Once Choksi, Janak, MD      . sodium chloride 0.9 % injection 10 mL  10 mL Intravenous PRN Forest Gleason, MD   10 mL at 11/21/14 1108  . sodium chloride 0.9 % injection 10 mL  10 mL Intravenous PRN Forest Gleason, MD   10 mL at 03/21/15 1044    Review of Systems General: Fatigue, weakness Skin: no complaints Eyes: no complaints HEENT: no complaints Breasts: no complaints Pulmonary: no complaints Cardiac: Fluid retention Gastrointestinal: Bloating, abdominal pain, diarrhea-history of IBS with diarrhea; controlled Genitourinary/Sexual: no complaints Ob/Gyn: no complaints Musculoskeletal: no complaints Hematology: no complaints Neurologic/Psych: Numbness, tingling   Objective:  Physical Examination:  BP 127/79 (BP Location: Left Arm, Patient Position: Sitting)   Pulse 67   Temp 98.4 F (36.9 C) (Tympanic)   Resp 20   Ht 5' 3" (1.6 m)   Wt 181 lb 9.6 oz (82.4 kg)   BMI 32.17 kg/m    ECOG Performance Status: 0 -  Asymptomatic  General appearance: alert, cooperative and appears stated age HEENT:PERRLA, extra ocular movement intact and sclera clear, anicteric Lymph node survey: non-palpable, axillary, inguinal, supraclavicular Cardiovascular:  regular rate and rhythm Respiratory: normal air entry, lungs clear to auscultation Breast exam: Not done Abdomen: soft, non-tender, without masses or organomegaly, hernia at umbilicus and now extending superiorly and inferiorly at least 12 cm in length and well healed incision. No ascites Extremities: extremities normal, atraumatic, no cyanosis or edema Neurological exam reveals alert, oriented, normal speech, no focal findings or movement disorder noted.  Pelvic: exam chaperoned by nurse;  Vulva: normal appearing vulva with no masses,; Vagina: normal vagina; Adnexa: surgically absent; Uterus: surgically absent, vaginal cuff well healed; Cervix: absent; Rectal: normal rectal, no masses  Lab Review Labs on site today: pending CA 125  Radiologic Imagin No imaging ordered today    Assessment:  Leah Santos is a 78 y.o. female diagnosed with stage II left ovarian cancer, adenocarcinoma s/p bilateral oophorectomy, omentectomy, lymph node dissection on 12/21/2012 followed by carbo-Taxol 01/2013 (allergic reaction to Taxol and second cycle; switch to gemcitabine) completed total of 6 cycles 05/2013.   Clinically, no evidence of disease today. Vulvovaginal candidiasis.   Body mass index is 32.17 kg/m. Incisional ventral hernia, asymptomatic.    Medical co-morbidities complicating care: HTN, obesity and prior abdominal surgery.  Plan:   Problem List Items Addressed This Visit      Other   History of ovarian cancer - Primary   Relevant Orders   CA 125   CA 125    Other Visit Diagnoses    Vulvovaginal candidiasis       Relevant Medications   fluconazole (DIFLUCAN) 150 MG tablet     CA 125 today pending.   Prescription for diflucan 150 mg tablet on  day 1 and day 3 sent to pharmacy for yeast infection.   At this point she is over 4 years out from completion of chemotherapy and without disease. Therefore we can extend her surveillance times to 6 months intervals. If she is NED in 5 years we will refer to gynecology for assistance with surveillance.  Mammogram and breast cancer screening per her PCP.   In the past we have discussed her weight and need for weight loss, stressed good nutrition, and exercise. She has been previously given a Radiation protection practitioner.   Beckey Rutter, DNP, AGNP-C [place] [facility] [phone] (work cell) [phone] (office)  I personally interviewed and examined the patient. Agreed with the above/below plan of care. Patient/family questions were answered.  Mellody Drown, MD   CC:  Rusty Aus, MD [facility] [address] [place], [place] [address] [phone]

## 2018-04-28 NOTE — Progress Notes (Signed)
Patient here for follow up. She has been having multiple yeast infection secondary to multiple.  Abx. She has a vaginal discharge.

## 2018-04-29 ENCOUNTER — Inpatient Hospital Stay: Payer: Medicare Other

## 2018-04-29 DIAGNOSIS — Z8543 Personal history of malignant neoplasm of ovary: Secondary | ICD-10-CM

## 2018-04-29 DIAGNOSIS — Z08 Encounter for follow-up examination after completed treatment for malignant neoplasm: Secondary | ICD-10-CM | POA: Diagnosis not present

## 2018-04-30 ENCOUNTER — Telehealth: Payer: Self-pay | Admitting: Nurse Practitioner

## 2018-04-30 LAB — CA 125: Cancer Antigen (CA) 125: <0.6 U/mL (ref 0.0–38.1)

## 2018-04-30 NOTE — Telephone Encounter (Signed)
Called patient with results of CA 125. Pt thanked for call. Return visit in 2021 as scheduled.

## 2018-08-20 ENCOUNTER — Ambulatory Visit: Payer: Medicare Other | Admitting: Internal Medicine

## 2018-08-20 ENCOUNTER — Other Ambulatory Visit: Payer: Medicare Other

## 2018-11-25 ENCOUNTER — Other Ambulatory Visit: Payer: Self-pay

## 2018-11-25 ENCOUNTER — Other Ambulatory Visit
Admission: RE | Admit: 2018-11-25 | Discharge: 2018-11-25 | Disposition: A | Discharge status: Home / Self Care | Payer: Medicare Other | Source: Ambulatory Visit | Attending: Gastroenterology | Admitting: Gastroenterology

## 2018-11-25 DIAGNOSIS — Z20828 Contact with and (suspected) exposure to other viral communicable diseases: Secondary | ICD-10-CM | POA: Diagnosis not present

## 2018-11-25 DIAGNOSIS — Z01812 Encounter for preprocedural laboratory examination: Secondary | ICD-10-CM | POA: Insufficient documentation

## 2018-11-25 LAB — SARS CORONAVIRUS 2 (TAT 6-24 HRS): SARS Coronavirus 2: NEGATIVE

## 2018-11-26 ENCOUNTER — Inpatient Hospital Stay: Payer: Medicare Other

## 2018-11-26 ENCOUNTER — Inpatient Hospital Stay: Payer: Medicare Other | Admitting: Internal Medicine

## 2018-11-29 ENCOUNTER — Other Ambulatory Visit: Payer: Medicare Other

## 2018-11-29 ENCOUNTER — Encounter: Payer: Self-pay | Admitting: Anesthesiology

## 2018-11-29 ENCOUNTER — Ambulatory Visit: Payer: Medicare Other | Admitting: Internal Medicine

## 2018-11-29 ENCOUNTER — Ambulatory Visit: Payer: Medicare Other | Admitting: Anesthesiology

## 2018-11-29 ENCOUNTER — Ambulatory Visit
Admission: RE | Admit: 2018-11-29 | Discharge: 2018-11-29 | Disposition: A | Discharge status: Home / Self Care | Payer: Medicare Other | Attending: Gastroenterology | Admitting: Gastroenterology

## 2018-11-29 ENCOUNTER — Encounter
Admission: RE | Disposition: A | Discharge status: Home / Self Care | Payer: Self-pay | Source: Home / Self Care | Attending: Gastroenterology

## 2018-11-29 DIAGNOSIS — K210 Gastro-esophageal reflux disease with esophagitis: Secondary | ICD-10-CM | POA: Diagnosis present

## 2018-11-29 DIAGNOSIS — E119 Type 2 diabetes mellitus without complications: Secondary | ICD-10-CM | POA: Insufficient documentation

## 2018-11-29 DIAGNOSIS — J45909 Unspecified asthma, uncomplicated: Secondary | ICD-10-CM | POA: Diagnosis not present

## 2018-11-29 DIAGNOSIS — Z9221 Personal history of antineoplastic chemotherapy: Secondary | ICD-10-CM | POA: Insufficient documentation

## 2018-11-29 DIAGNOSIS — Z79899 Other long term (current) drug therapy: Secondary | ICD-10-CM | POA: Insufficient documentation

## 2018-11-29 DIAGNOSIS — K317 Polyp of stomach and duodenum: Secondary | ICD-10-CM | POA: Insufficient documentation

## 2018-11-29 DIAGNOSIS — K449 Diaphragmatic hernia without obstruction or gangrene: Secondary | ICD-10-CM | POA: Diagnosis not present

## 2018-11-29 DIAGNOSIS — K2960 Other gastritis without bleeding: Secondary | ICD-10-CM | POA: Diagnosis not present

## 2018-11-29 DIAGNOSIS — K2270 Barrett's esophagus without dysplasia: Secondary | ICD-10-CM | POA: Diagnosis not present

## 2018-11-29 DIAGNOSIS — Z8543 Personal history of malignant neoplasm of ovary: Secondary | ICD-10-CM | POA: Insufficient documentation

## 2018-11-29 DIAGNOSIS — Z7951 Long term (current) use of inhaled steroids: Secondary | ICD-10-CM | POA: Diagnosis not present

## 2018-11-29 DIAGNOSIS — Z79890 Hormone replacement therapy (postmenopausal): Secondary | ICD-10-CM | POA: Diagnosis not present

## 2018-11-29 DIAGNOSIS — E039 Hypothyroidism, unspecified: Secondary | ICD-10-CM | POA: Insufficient documentation

## 2018-11-29 DIAGNOSIS — K644 Residual hemorrhoidal skin tags: Secondary | ICD-10-CM | POA: Insufficient documentation

## 2018-11-29 DIAGNOSIS — K621 Rectal polyp: Secondary | ICD-10-CM | POA: Insufficient documentation

## 2018-11-29 DIAGNOSIS — D125 Benign neoplasm of sigmoid colon: Secondary | ICD-10-CM | POA: Insufficient documentation

## 2018-11-29 DIAGNOSIS — Z86010 Personal history of colonic polyps: Secondary | ICD-10-CM | POA: Insufficient documentation

## 2018-11-29 DIAGNOSIS — D123 Benign neoplasm of transverse colon: Secondary | ICD-10-CM | POA: Diagnosis not present

## 2018-11-29 DIAGNOSIS — K589 Irritable bowel syndrome without diarrhea: Secondary | ICD-10-CM | POA: Insufficient documentation

## 2018-11-29 DIAGNOSIS — K5730 Diverticulosis of large intestine without perforation or abscess without bleeding: Secondary | ICD-10-CM | POA: Insufficient documentation

## 2018-11-29 DIAGNOSIS — K641 Second degree hemorrhoids: Secondary | ICD-10-CM | POA: Diagnosis not present

## 2018-11-29 DIAGNOSIS — I10 Essential (primary) hypertension: Secondary | ICD-10-CM | POA: Insufficient documentation

## 2018-11-29 HISTORY — PX: ESOPHAGOGASTRODUODENOSCOPY (EGD) WITH PROPOFOL: SHX5813

## 2018-11-29 HISTORY — PX: COLONOSCOPY WITH PROPOFOL: SHX5780

## 2018-11-29 HISTORY — DX: Type 2 diabetes mellitus without complications: E11.9

## 2018-11-29 HISTORY — DX: Anemia, unspecified: D64.9

## 2018-11-29 LAB — GLUCOSE, CAPILLARY: Glucose-Capillary: 138 mg/dL — ABNORMAL HIGH (ref 70–99)

## 2018-11-29 SURGERY — ESOPHAGOGASTRODUODENOSCOPY (EGD) WITH PROPOFOL
Anesthesia: General

## 2018-11-29 MED ORDER — SODIUM CHLORIDE 0.9 % IV SOLN: INTRAVENOUS | Status: DC

## 2018-11-29 MED ORDER — PROPOFOL 500 MG/50ML IV EMUL
INTRAVENOUS | Status: DC | PRN
  Administered 2018-11-29: 50 ug/kg/min via INTRAVENOUS

## 2018-11-29 MED ORDER — MIDAZOLAM HCL 5 MG/5ML IJ SOLN
INTRAMUSCULAR | Status: DC | PRN
  Administered 2018-11-29: 2 mg via INTRAVENOUS

## 2018-11-29 MED ORDER — MIDAZOLAM HCL 2 MG/2ML IJ SOLN
INTRAMUSCULAR | Status: AC
  Filled 2018-11-29: qty 2

## 2018-11-29 MED ORDER — PROPOFOL 10 MG/ML IV BOLUS
INTRAVENOUS | Status: DC | PRN
  Administered 2018-11-29: 20 mg via INTRAVENOUS
  Administered 2018-11-29: 30 mg via INTRAVENOUS

## 2018-11-29 MED ORDER — EPHEDRINE SULFATE 50 MG/ML IJ SOLN
INTRAMUSCULAR | Status: DC | PRN
  Administered 2018-11-29 (×2): 10 mg via INTRAVENOUS

## 2018-11-29 MED ORDER — SODIUM CHLORIDE 0.9 % IV SOLN
INTRAVENOUS | Status: DC
  Administered 2018-11-29: 10:00:00 via INTRAVENOUS
  Administered 2018-11-29: 1000 mL via INTRAVENOUS

## 2018-11-29 MED ORDER — FENTANYL CITRATE (PF) 100 MCG/2ML IJ SOLN
INTRAMUSCULAR | Status: AC
  Filled 2018-11-29: qty 2

## 2018-11-29 MED ORDER — LIDOCAINE HCL (PF) 2 % IJ SOLN
INTRAMUSCULAR | Status: AC
  Filled 2018-11-29: qty 10

## 2018-11-29 MED ORDER — GLYCOPYRROLATE 0.2 MG/ML IJ SOLN
INTRAMUSCULAR | Status: DC | PRN
  Administered 2018-11-29: 0.2 mg via INTRAVENOUS

## 2018-11-29 MED ORDER — LIDOCAINE HCL (PF) 2 % IJ SOLN
INTRAMUSCULAR | Status: DC | PRN
  Administered 2018-11-29: 80 mg

## 2018-11-29 MED ORDER — FENTANYL CITRATE (PF) 100 MCG/2ML IJ SOLN
INTRAMUSCULAR | Status: DC | PRN
  Administered 2018-11-29 (×2): 50 ug via INTRAVENOUS

## 2018-11-29 MED ORDER — PROPOFOL 10 MG/ML IV BOLUS
INTRAVENOUS | Status: AC
  Filled 2018-11-29: qty 20

## 2018-11-29 NOTE — Transfer of Care (Signed)
Immediate Anesthesia Transfer of Care Note  Patient: Leah Santos  Procedure(s) Performed: ESOPHAGOGASTRODUODENOSCOPY (EGD) WITH PROPOFOL (N/A ) COLONOSCOPY WITH PROPOFOL (N/A )  Patient Location: PACU  Anesthesia Type:General  Level of Consciousness: sedated  Airway & Oxygen Therapy: Patient Spontanous Breathing and Patient connected to nasal cannula oxygen  Post-op Assessment: Report given to RN and Post -op Vital signs reviewed and stable  Post vital signs: Reviewed and stable  Last Vitals:  Vitals Value Taken Time  BP 99/42 11/29/18 0940  Temp    Pulse 69 11/29/18 0941  Resp 14 11/29/18 0941  SpO2 100 % 11/29/18 0941  Vitals shown include unvalidated device data.  Last Pain:  Vitals:   11/29/18 0930  TempSrc: Tympanic  PainSc:          Complications: No apparent anesthesia complications

## 2018-11-29 NOTE — Anesthesia Postprocedure Evaluation (Signed)
Anesthesia Post Note  Patient: Kolette Vey  Procedure(s) Performed: ESOPHAGOGASTRODUODENOSCOPY (EGD) WITH PROPOFOL (N/A ) COLONOSCOPY WITH PROPOFOL (N/A )  Patient location during evaluation: Endoscopy Anesthesia Type: General Level of consciousness: awake and alert Pain management: pain level controlled Vital Signs Assessment: post-procedure vital signs reviewed and stable Respiratory status: spontaneous breathing, nonlabored ventilation, respiratory function stable and patient connected to nasal cannula oxygen Cardiovascular status: blood pressure returned to baseline and stable Postop Assessment: no apparent nausea or vomiting Anesthetic complications: no     Last Vitals:  Vitals:   11/29/18 1000 11/29/18 1010  BP: 136/60 108/67  Pulse: 71 73  Resp: 20 (!) 22  Temp:    SpO2: 99% 98%    Last Pain:  Vitals:   11/29/18 0930  TempSrc: Tympanic  PainSc:                  Martha Clan

## 2018-11-29 NOTE — Op Note (Signed)
[facility] Patient Name: Leah Santos Procedure Date: 11/29/2018 8:14 AM MRN: [id number] Account #: [id number] Date of Birth: [birth year] Admit Type: Outpatient Age: 78 Room: [facility] ENDO ROOM 3 Gender: Female Note Status: Finalized Procedure:            Colonoscopy Indications:          Chronic diarrhea, Personal history of colonic polyps Providers:            Lollie Sails, MD Medicines:            Monitored Anesthesia Care Complications:        No immediate complications. Procedure:            Pre-Anesthesia Assessment:                       - ASA Grade Assessment: III - A patient with severe                        systemic disease.                       After obtaining informed consent, the colonoscope was                        passed under direct vision. Throughout the procedure,                        the patient's blood pressure, pulse, and oxygen                        saturations were monitored continuously. The                        Colonoscope was introduced through the anus and                        advanced to the the cecum, identified by appendiceal                        orifice and ileocecal valve. The colonoscopy was                        performed without difficulty. The patient tolerated the                        procedure well. The quality of the bowel preparation                        was good. Findings:      Multiple small-mouthed diverticula were found in the sigmoid colon,       descending colon, transverse colon and ascending colon.      Two sessile polyps were found in the distal sigmoid colon. The polyps       were 1 to 2 mm in size. These polyps were removed with a cold biopsy       forceps. Resection and retrieval were complete.      A 2 mm polyp was found in the splenic flexure. The polyp was sessile.       The polyp was removed with a cold biopsy forceps. Resection and       retrieval were complete.  Two  sessile polyps were found in the transverse colon. The polyps were 2       to 4 mm in size. These polyps were removed with a piecemeal technique       using a cold biopsy forceps. Resection and retrieval were complete.      Two sessile polyps were found in the mid sigmoid colon. The polyps were       3 to 9 mm in size. These polyps were removed with a cold snare.       Resection and retrieval were complete.      A 2 mm polyp was found in the rectum. The polyp was sessile. The polyp       was removed with a cold biopsy forceps. Resection and retrieval were       complete.      Biopsies for histology were taken with a cold forceps from the right       colon and left colon for evaluation of microscopic colitis.      Non-bleeding external and internal hemorrhoids were found during       retroflexion, during perianal exam and during anoscopy. The hemorrhoids       were small and Grade II (internal hemorrhoids that prolapse but reduce       spontaneously). Impression:           - Diverticulosis in the sigmoid colon, in the                        descending colon, in the transverse colon and in the                        ascending colon.                       - Two 1 to 2 mm polyps in the distal sigmoid colon,                        removed with a cold biopsy forceps. Resected and                        retrieved.                       - One 2 mm polyp at the splenic flexure, removed with a                        cold biopsy forceps. Resected and retrieved.                       - Two 2 to 4 mm polyps in the transverse colon, removed                        piecemeal using a cold biopsy forceps. Resected and                        retrieved.                       - Two 3 to 9 mm polyps in the mid sigmoid colon,                        removed with  a cold snare. Resected and retrieved.                       - One 2 mm polyp in the rectum, removed with a cold                        biopsy  forceps. Resected and retrieved.                       - Non-bleeding external and internal hemorrhoids.                       - Biopsies were taken with a cold forceps from the                        right colon and left colon for evaluation of                        microscopic colitis. Recommendation:       - Discharge patient to home.                       - Full liquid diet today.                       - Soft diet for 1 day, then advance as tolerated to                        advance diet as tolerated. Procedure Code(s):    --- Professional ---                       [id number], Colonoscopy, flexible; with removal of tumor(s),                        polyp(s), or other lesion(s) by snare technique                       45380, 25, Colonoscopy, flexible; with biopsy, single                        or multiple Diagnosis Code(s):    --- Professional ---                       K64.1, Second degree hemorrhoids                       K63.5, Polyp of colon                       K62.1, Rectal polyp                       K52.9, Noninfective gastroenteritis and colitis,                        unspecified                       Z86.010, Personal history of colonic polyps                       K57.30, Diverticulosis of large intestine without  perforation or abscess without bleeding CPT copyright 2019 American Medical Association. All rights reserved. The codes documented in this report are preliminary and upon coder review may  be revised to meet current compliance requirements. Lollie Sails, MD 11/29/2018 9:39:16 AM This report has been signed electronically. Number of Addenda: 0 Note Initiated On: 11/29/2018 8:14 AM Scope Withdrawal Time: 0 hours 17 minutes 4 seconds  Total Procedure Duration: 0 hours 29 minutes 35 seconds       [facility]

## 2018-11-29 NOTE — Op Note (Signed)
[facility] Patient Name: Leah Santos Procedure Date: 11/29/2018 8:15 AM MRN: [id number] Account #: [id number] Date of Birth: 01-16-1941 Admit Type: Outpatient Age: 78 Room: [facility] ENDO ROOM 3 Gender: Female Note Status: Finalized Procedure:            Upper GI endoscopy Indications:          Gastro-esophageal reflux disease, Follow-up of                        Barrett's esophagus Providers:            Lollie Sails, MD Referring MD:         Rusty Aus, MD (Referring MD) Medicines:            Monitored Anesthesia Care Complications:        No immediate complications. Procedure:            Pre-Anesthesia Assessment:                       - ASA Grade Assessment: III - A patient with severe                        systemic disease.                       After obtaining informed consent, the endoscope was                        passed under direct vision. Throughout the procedure,                        the patient's blood pressure, pulse, and oxygen                        saturations were monitored continuously. The Endoscope                        was introduced through the mouth, and advanced to the                        third part of duodenum. The upper GI endoscopy was                        accomplished without difficulty. The patient tolerated                        the procedure well. Findings:      The Z-line was variable. Biopsies were taken with a cold forceps for       histology.      LA Grade A (one or more mucosal breaks less than 5 mm, not extending       between tops of 2 mucosal folds) esophagitis with no bleeding was found.      There were esophageal mucosal changes suggestive of short-segment       Barrett's esophagus present in the lower third of the esophagus and at       the gastroesophageal junction. The maximum longitudinal extent of these       mucosal changes was 1 cm in length. Mucosa was biopsied with a cold     forceps for histology in a targeted manner and in 4  quadrants. One       specimen bottle was sent to pathology.      The exam of the esophagus was otherwise normal.      Diffuse and patchy mild inflammation characterized by congestion       (edema), erosions and friability was found in the gastric body and in       the gastric antrum. Biopsies were taken with a cold forceps for       histology. Biopsies were taken with a cold forceps for Helicobacter       pylori testing.      A small hiatal hernia was present.      The cardia and gastric fundus were normal on retroflexion otherwise.      A few 1 to 3 mm sessile polyps with no bleeding and no stigmata of       recent bleeding were found in the gastric body. The 2 polyp was removed       with a cold biopsy forceps. Resection and retrieval were complete.      The examined duodenum was normal. Impression:           - Z-line variable. Biopsied.                       - LA Grade A erosive esophagitis.                       - Esophageal mucosal changes suggestive of                        short-segment Barrett's esophagus. Biopsied.                       - Bile erosive gastritis. Biopsied.                       - Small hiatal hernia.                       - A few gastric polyps. Resected and retrieved.                       - Normal examined duodenum. Recommendation:       - Use Protonix (pantoprazole) 40 mg PO daily daily.                       - consider maintenance of sucralfate for symptoms                       - Await pathology results.                       - Return to GI clinic in 3 weeks. Procedure Code(s):    --- Professional ---                       [phone], Esophagogastroduodenoscopy, flexible, transoral;                        with biopsy, single or multiple Diagnosis Code(s):    --- Professional ---                       K22.8, Other specified diseases of esophagus  K20.8, Other esophagitis                        K22.70, Barrett's esophagus without dysplasia                       K29.60, Other gastritis without bleeding                       K44.9, Diaphragmatic hernia without obstruction or                        gangrene                       K31.7, Polyp of stomach and duodenum                       K21.9, Gastro-esophageal reflux disease without                        esophagitis CPT copyright 2019 American Medical Association. All rights reserved. The codes documented in this report are preliminary and upon coder review may  be revised to meet current compliance requirements. Lollie Sails, MD 11/29/2018 9:01:35 AM This report has been signed electronically. Number of Addenda: 0 Note Initiated On: 11/29/2018 8:15 AM Total Procedure Duration: 0 hours 14 minutes 26 seconds       [facility]

## 2018-11-29 NOTE — Anesthesia Preprocedure Evaluation (Signed)
Anesthesia Evaluation  Patient identified by MRN, date of birth, ID band Patient awake    Reviewed: Allergy & Precautions, H&P , NPO status , Patient's Chart, lab work & pertinent test results, reviewed documented beta blocker date and time   History of Anesthesia Complications Negative for: history of anesthetic complications  Airway Mallampati: IV  TM Distance: >3 FB Neck ROM: full  Mouth opening: Limited Mouth Opening  Dental  (+) Dental Advidsory Given, Chipped, Caps, Teeth Intact   Pulmonary neg shortness of breath, asthma , sleep apnea , neg recent URI,    Pulmonary exam normal        Cardiovascular Exercise Tolerance: Good hypertension, (-) angina(-) Past MI and (-) Cardiac Stents Normal cardiovascular exam(-) dysrhythmias (-) Valvular Problems/Murmurs     Neuro/Psych negative neurological ROS  negative psych ROS   GI/Hepatic Neg liver ROS, GERD  ,  Endo/Other  diabetesHypothyroidism   Renal/GU negative Renal ROS  negative genitourinary   Musculoskeletal   Abdominal   Peds  Hematology negative hematology ROS (+)   Anesthesia Other Findings Past Medical History: No date: Anemia No date: Asthma No date: Barrett's esophagus No date: Cellulitis     Comment:  Right lower extremity No date: Chronic acquired lymphedema     Comment:  since childhood No date: Diabetes mellitus without complication (HCC) No date: GERD (gastroesophageal reflux disease) No date: History of cellulitis No date: History of chemotherapy     Comment:  finished total 6 cycles of chemotherapy(initially with               carboplatinum Taxol followed by carboplatinum and               gemcitabine(February, 2015) No date: History of colon polyps 11/07/13: History of colonoscopy with polypectomy 1986: History of genital warts 04/29/2014: History of mammogram No date: HTN (hypertension) No date: Hypothyroidism No date: IBS (irritable bowel  syndrome) No date: Ovarian cancer on left [facility])     Comment:  No BRCA mutation-Left ovarian cancer, adenocarcinoma.pT2              pNO  M0    Reproductive/Obstetrics negative OB ROS                             Anesthesia Physical Anesthesia Plan  ASA: III  Anesthesia Plan: General   Post-op Pain Management:    Induction: Intravenous  PONV Risk Score and Plan: 3 and Propofol infusion and TIVA  Airway Management Planned: Nasal Cannula and Natural Airway  Additional Equipment:   Intra-op Plan:   Post-operative Plan:   Informed Consent: I have reviewed the patients History and Physical, chart, labs and discussed the procedure including the risks, benefits and alternatives for the proposed anesthesia with the patient or authorized representative who has indicated his/her understanding and acceptance.     Dental Advisory Given  Plan Discussed with: Anesthesiologist, CRNA and Surgeon  Anesthesia Plan Comments:         Anesthesia Quick Evaluation

## 2018-11-29 NOTE — Anesthesia Post-op Follow-up Note (Signed)
Anesthesia QCDR form completed.        

## 2018-11-29 NOTE — H&P (Signed)
Outpatient short stay form Pre-procedure 11/29/2018 8:24 AM Lollie Sails MD  Primary Physician: Dr. Emily Filbert  Reason for visit: EGD and colonoscopy  History of present illness: Patient is a 78 year old female presenting today for an EGD and colonoscopy in regards her personal history of Barrett's esophagus and personal history of colon polyps.  He is taking a proton pump inhibitor having only restarted that recently.  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  She denies any abdominal pain or rectal bleeding.  Tolerating her prep well.  She takes no aspirin or blood thinning agent.    Current Facility-Administered Medications:  .  0.9 %  sodium chloride infusion, , Intravenous, Continuous, Lollie Sails, MD, Last Rate: 20 mL/hr at 11/29/18 0807, 1,000 mL at 11/29/18 0807  Facility-Administered Medications Ordered in Other Encounters:  .  heparin lock flush 100 unit/mL, 500 Units, Intravenous, Once, Choksi, Janak, MD .  sodium chloride 0.9 % injection 10 mL, 10 mL, Intravenous, PRN, Choksi, Janak, MD, 10 mL at 11/21/14 1108 .  sodium chloride 0.9 % injection 10 mL, 10 mL, Intravenous, PRN, Choksi, Janak, MD, 10 mL at 03/21/15 1044  Medications Prior to Admission  Medication Sig Dispense Refill Last Dose  . Cholecalciferol (VITAMIN D3) 1000 UNITS CAPS Take 1 tablet by mouth daily.   Past Week at Unknown time  . cycloSPORINE (RESTASIS) 0.05 % ophthalmic emulsion Place 1 drop into both eyes 2 (two) times daily.   11/28/2018 at Unknown time  . escitalopram (LEXAPRO) 10 MG tablet Take 5-10 mg by mouth daily.   Past Week at Unknown time  . fluconazole (DIFLUCAN) 150 MG tablet Take 1 tablet (150 mg) by mouth on day one. Three days later, take second tablet for treatment of vaginal yeast. 2 tablet 0 Past Week at Unknown time  . fluticasone (FLONASE) 50 MCG/ACT nasal spray Place 2  sprays into both nostrils daily as needed for allergies or rhinitis.   11/28/2018 at Unknown time  . ibuprofen (ADVIL,MOTRIN) 200 MG tablet Take 200-400 mg by mouth every 6 (six) hours as needed for fever, mild pain or moderate pain.    Past Week at Unknown time  . levothyroxine (SYNTHROID, LEVOTHROID) 50 MCG tablet Take 75 mcg by mouth daily before breakfast.    11/29/2018 at 0530  . Multiple Vitamins-Minerals (CENTRUM SILVER ADULT 50+ PO) Take 1 tablet by mouth daily.   Past Week at Unknown time  . potassium chloride (KLOR-CON 10) 10 MEQ tablet Take 40 mEq by mouth daily.    Past Week at Unknown time  . triamterene-hydrochlorothiazide (MAXZIDE-25) 37.5-25 MG per tablet Take 1 tablet by mouth daily.   11/28/2018 at Unknown time     Allergies  Allergen Reactions  . Statins Other (See Comments)    Muscle cramps  . Taxol [Paclitaxel]     Allergic reaction to Taxol ON 2nd treatment.  Taxol was discontinued and started on gemcitabine   . Sulfa Antibiotics Rash  . Tape Rash    Skin irritation with prolonged use (bandages with latex)     Past Medical History:  Diagnosis Date  . Anemia   . Asthma   . Barrett's esophagus   . Cellulitis    Right lower extremity  . Chronic acquired lymphedema    since childhood  . Diabetes mellitus without complication ([place])   . GERD (gastroesophageal reflux disease)   . History of cellulitis   .  History of chemotherapy    finished total 6 cycles of chemotherapy(initially with carboplatinum Taxol followed by carboplatinum and gemcitabine(February, 2015)  . History of colon polyps   . History of colonoscopy with polypectomy 11/07/13  . History of genital warts 1986  . History of mammogram 04/29/2014  . HTN (hypertension)   . Hypothyroidism   . IBS (irritable bowel syndrome)   . Ovarian cancer on left (HCC)    No BRCA mutation-Left ovarian cancer, adenocarcinoma.pT2 pNO  M0     Review of systems:      Physical Exam    Heart and lungs: Regular rate and  rhythm without rub or gallop lungs are bilaterally clear    HEENT: Normocephalic atraumatic eyes are anicteric    Other:    Pertinant exam for procedure: Soft nontender nondistended bowel sounds positive normoactive    Planned proceedures: EGD, colonoscopy and indicated procedures. I have discussed the risks benefits and complications of procedures to include not limited to bleeding, infection, perforation and the risk of sedation and the patient wishes to proceed.    Lollie Sails, MD Gastroenterology 11/29/2018  8:24 AM

## 2018-11-30 ENCOUNTER — Other Ambulatory Visit: Payer: Self-pay | Admitting: *Deleted

## 2018-11-30 ENCOUNTER — Encounter: Payer: Self-pay | Admitting: Gastroenterology

## 2018-11-30 DIAGNOSIS — C561 Malignant neoplasm of right ovary: Secondary | ICD-10-CM

## 2018-11-30 DIAGNOSIS — Z8543 Personal history of malignant neoplasm of ovary: Secondary | ICD-10-CM

## 2018-11-30 LAB — SURGICAL PATHOLOGY

## 2018-12-01 ENCOUNTER — Other Ambulatory Visit: Payer: Self-pay | Admitting: Internal Medicine

## 2018-12-01 ENCOUNTER — Inpatient Hospital Stay (HOSPITAL_BASED_OUTPATIENT_CLINIC_OR_DEPARTMENT_OTHER): Payer: Medicare Other | Admitting: Internal Medicine

## 2018-12-01 ENCOUNTER — Inpatient Hospital Stay: Payer: Medicare Other | Attending: Internal Medicine

## 2018-12-01 ENCOUNTER — Other Ambulatory Visit: Payer: Self-pay

## 2018-12-01 DIAGNOSIS — C561 Malignant neoplasm of right ovary: Secondary | ICD-10-CM

## 2018-12-01 DIAGNOSIS — R1310 Dysphagia, unspecified: Secondary | ICD-10-CM | POA: Insufficient documentation

## 2018-12-01 DIAGNOSIS — Z1231 Encounter for screening mammogram for malignant neoplasm of breast: Secondary | ICD-10-CM

## 2018-12-01 LAB — COMPREHENSIVE METABOLIC PANEL
ALT: 27 U/L (ref 0–44)
AST: 26 U/L (ref 15–41)
Albumin: 3.8 g/dL (ref 3.5–5.0)
Alkaline Phosphatase: 64 U/L (ref 38–126)
Anion gap: 9 (ref 5–15)
BUN: 11 mg/dL (ref 8–23)
CO2: 26 mmol/L (ref 22–32)
Calcium: 9.3 mg/dL (ref 8.9–10.3)
Chloride: 103 mmol/L (ref 98–111)
Creatinine, Ser: 0.79 mg/dL (ref 0.44–1.00)
GFR calc Af Amer: >60 mL/min (ref >60)
GFR calc non Af Amer: >60 mL/min (ref >60)
Glucose, Bld: 121 mg/dL — ABNORMAL HIGH (ref 70–99)
Potassium: 3.3 mmol/L — ABNORMAL LOW (ref 3.5–5.1)
Sodium: 138 mmol/L (ref 135–145)
Total Bilirubin: 0.6 mg/dL (ref 0.3–1.2)
Total Protein: 6.8 g/dL (ref 6.5–8.1)

## 2018-12-01 LAB — CBC WITH DIFFERENTIAL/PLATELET
Abs Immature Granulocytes: 0.01 10*3/uL (ref 0.00–0.07)
Basophils Absolute: 0 10*3/uL (ref 0.0–0.1)
Basophils Relative: 0 %
Eosinophils Absolute: 0.2 10*3/uL (ref 0.0–0.5)
Eosinophils Relative: 3 %
HCT: 38.5 % (ref 36.0–46.0)
Hemoglobin: 13 g/dL (ref 12.0–15.0)
Immature Granulocytes: 0 %
Lymphocytes Relative: 27 %
Lymphs Abs: 2 10*3/uL (ref 0.7–4.0)
MCH: 30.1 pg (ref 26.0–34.0)
MCHC: 33.8 g/dL (ref 30.0–36.0)
MCV: 89.1 fL (ref 80.0–100.0)
Monocytes Absolute: 0.5 10*3/uL (ref 0.1–1.0)
Monocytes Relative: 6 %
Neutro Abs: 4.9 10*3/uL (ref 1.7–7.7)
Neutrophils Relative %: 64 %
Platelets: 151 10*3/uL (ref 150–400)
RBC: 4.32 MIL/uL (ref 3.87–5.11)
RDW: 13.6 % (ref 11.5–15.5)
WBC: 7.6 10*3/uL (ref 4.0–10.5)
nRBC: 0 % (ref 0.0–0.2)

## 2018-12-01 NOTE — Progress Notes (Signed)
[place] OFFICE PROGRESS NOTE  Patient Care Team: Rusty Aus, MD as PCP - General (Internal Medicine) Clent Jacks, RN as Registered Nurse  Cancer Staging No matching staging information was found for the patient.  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 [Dr.Secord]  # BRCA 1& 2- NEG    Ovarian cancer, right ([place])  01/24/2016 Initial Diagnosis   Ovarian cancer, right ([place])     Oncology History Overview Note  #  2014  RIGHT ovarian cancer stage II s/p TAH & BSO; carbo-abraxane x 6 months. [Dr.Secord]  # BRCA 1& 2- NEG    Ovarian cancer, right ([place])  01/24/2016 Initial Diagnosis   Ovarian cancer, right ([place])      INTERVAL HISTORY:  Leah Santos 78 y.o.  female pleasant patient above history of Ovarian cancer stage II surgery followed by adjuvant chemotherapy is here for follow-up.    Patient had a recent EGD/colonoscopy-difficulty swallowing.  And also had surveillance colonoscopy that showed multiple polyps.  Patient had a CT scan of the abdomen pelvis in May 2019-there is negative for any disease recurrence.  Patient continues to follow-up with gynecology every 6 months.  Patient appetite is good.  No weight loss no nausea no vomiting.  Review of Systems  Constitutional: Negative for chills, diaphoresis, fever, malaise/fatigue and weight loss.  HENT: Negative for nosebleeds and sore throat.   Eyes: Negative for double vision.  Respiratory: Negative for cough, hemoptysis, sputum production, shortness of breath and wheezing.   Cardiovascular: Negative for chest pain, palpitations, orthopnea and leg swelling.  Gastrointestinal: Negative for abdominal pain, blood in stool, constipation, diarrhea, heartburn, melena, nausea and vomiting.  Genitourinary: Negative for dysuria, frequency and urgency.  Musculoskeletal: Positive for back pain and joint pain.  Skin:  Negative.  Negative for itching and rash.  Neurological: Negative for dizziness, tingling, focal weakness, weakness and headaches.  Endo/Heme/Allergies: Does not bruise/bleed easily.  Psychiatric/Behavioral: Negative for depression. The patient is not nervous/anxious and does not have insomnia.      PAST MEDICAL HISTORY :  Past Medical History:  Diagnosis Date  . Anemia   . Asthma   . Barrett's esophagus   . Cellulitis    Right lower extremity  . Chronic acquired lymphedema    since childhood  . Diabetes mellitus without complication ([place])   . GERD (gastroesophageal reflux disease)   . History of cellulitis   . History of chemotherapy    finished total 6 cycles of chemotherapy(initially with carboplatinum Taxol followed by carboplatinum and gemcitabine(February, 2015)  . History of colon polyps   . History of colonoscopy with polypectomy 11/07/13  . History of genital warts 1986  . History of mammogram 04/29/2014  . HTN (hypertension)   . Hypothyroidism   . IBS (irritable bowel syndrome)   . Ovarian cancer on left (HCC)    No BRCA mutation-Left ovarian cancer, adenocarcinoma.pT2 pNO  M0     PAST SURGICAL HISTORY :   Past Surgical History:  Procedure Laterality Date  . BREAST BIOPSY Right 1994   neg  . BREAST LUMPECTOMY  1962  . CHOLECYSTECTOMY  1996  . COLONOSCOPY W/ POLYPECTOMY  11/07/13  . COLONOSCOPY WITH PROPOFOL N/A 11/29/2018   Procedure: COLONOSCOPY WITH PROPOFOL;  Surgeon: Lollie Sails, MD;  Location[facility] ENDOSCOPY;  Service: Endoscopy;  Laterality: N/A;  . ESOPHAGOGASTRODUODENOSCOPY (EGD) WITH PROPOFOL N/A 11/29/2018   Procedure:  ESOPHAGOGASTRODUODENOSCOPY (EGD) WITH PROPOFOL;  Surgeon: Lollie Sails, MD;  Location: [facility] ENDOSCOPY;  Service: Endoscopy;  Laterality: N/A;  . ESOPHAGOGASTRODUODENOSCOPY ENDOSCOPY  11/07/13  . Exp. Laparotomy, TAH, Salpingo-oophorectomy, Appendectomy  12/2012  . [place]  . TUBAL LIGATION      FAMILY HISTORY  :   Family History  Problem Relation Age of Onset  . Ovarian cancer Mother 28  . Breast cancer Maternal Aunt   . Breast cancer Maternal Aunt 70  . Lung cancer Maternal Uncle 75  . Prostate cancer Maternal Uncle 65  . Uterine cancer Maternal Aunt 20    SOCIAL HISTORY:   Social History   Tobacco Use  . Smoking status: Never Smoker  . Smokeless tobacco: Never Used  Substance Use Topics  . Alcohol use: No    Alcohol/week: 0.0 standard drinks  . Drug use: No    ALLERGIES:  is allergic to statins; taxol [paclitaxel]; sulfa antibiotics; and tape.  MEDICATIONS:  Current Outpatient Medications  Medication Sig Dispense Refill  . Cholecalciferol (VITAMIN D3) 1000 UNITS CAPS Take 1 tablet by mouth daily.    . cycloSPORINE (RESTASIS) 0.05 % ophthalmic emulsion Place 1 drop into both eyes 2 (two) times daily.    Marland Kitchen escitalopram (LEXAPRO) 10 MG tablet Take 5-10 mg by mouth daily.    . fluticasone (FLONASE) 50 MCG/ACT nasal spray Place 2 sprays into both nostrils daily as needed for allergies or rhinitis.    Marland Kitchen levothyroxine (SYNTHROID, LEVOTHROID) 50 MCG tablet Take 75 mcg by mouth daily before breakfast.     . Multiple Vitamins-Minerals (CENTRUM SILVER ADULT 50+ PO) Take 1 tablet by mouth daily.    Marland Kitchen omeprazole (PRILOSEC) 40 MG capsule Take 1 capsule by mouth daily.    . potassium chloride (KLOR-CON 10) 10 MEQ tablet Take 40 mEq by mouth daily.     Marland Kitchen triamterene-hydrochlorothiazide (MAXZIDE-25) 37.5-25 MG per tablet Take 1 tablet by mouth daily.     No current facility-administered medications for this visit.    Facility-Administered Medications Ordered in Other Visits  Medication Dose Route Frequency Provider Last Rate Last Dose  . heparin lock flush 100 unit/mL  500 Units Intravenous Once Choksi, Janak, MD      . sodium chloride 0.9 % injection 10 mL  10 mL Intravenous PRN Forest Gleason, MD   10 mL at 11/21/14 1108  . sodium chloride 0.9 % injection 10 mL  10 mL Intravenous PRN Forest Gleason, MD   10 mL at 03/21/15 1044    PHYSICAL EXAMINATION: ECOG PERFORMANCE STATUS: 0 - Asymptomatic  BP 136/77   Pulse 69   Temp 98.5 F (36.9 C) (Tympanic)   Resp 20   Ht _0  (1.6 m)   Wt 182 lb (82.6 kg)   BMI 32.24 kg/m   Filed Weights   12/01/18 1428  Weight: 182 lb (82.6 kg)    Physical Exam  Constitutional: She is oriented to person, place, and time and well-developed, well-nourished, and in no distress.  HENT:  Head: Normocephalic and atraumatic.  Mouth/Throat: Oropharynx is clear and moist. No oropharyngeal exudate.  Eyes: Pupils are equal, round, and reactive to light.  Neck: Normal range of motion. Neck supple.  Cardiovascular: Normal rate and regular rhythm.  Pulmonary/Chest: No respiratory distress. She has no wheezes.  Abdominal: Soft. Bowel sounds are normal. She exhibits no distension and no mass. There is no abdominal tenderness. There is no rebound and no guarding.  Musculoskeletal: Normal range of motion.  General: No tenderness or edema.  Neurological: She is alert and oriented to person, place, and time.  Skin: Skin is warm.  Psychiatric: Affect normal.   LABORATORY DATA:  I have reviewed the data as listed    Component Value Date/Time   NA 138 12/01/2018 1403   NA 140 05/24/2014 1034   K 3.3 (L) 12/01/2018 1403   K 3.3 (L) 05/24/2014 1034   CL 103 12/01/2018 1403   CL 97 (L) 05/24/2014 1034   CO2 26 12/01/2018 1403   CO2 30 05/24/2014 1034   GLUCOSE 121 (H) 12/01/2018 1403   GLUCOSE 152 (H) 05/24/2014 1034   BUN 11 12/01/2018 1403   BUN 19 (H) 05/24/2014 1034   CREATININE 0.79 12/01/2018 1403   CREATININE 1.55 (H) 05/24/2014 1034   CALCIUM 9.3 12/01/2018 1403   CALCIUM 9.6 05/24/2014 1034   PROT 6.8 12/01/2018 1403   PROT 7.6 05/24/2014 1034   ALBUMIN 3.8 12/01/2018 1403   ALBUMIN 3.8 05/24/2014 1034   AST 26 12/01/2018 1403   AST 30 05/24/2014 1034   ALT 27 12/01/2018 1403   ALT 55 05/24/2014 1034   ALKPHOS 64 12/01/2018  1403   ALKPHOS 98 05/24/2014 1034   BILITOT 0.6 12/01/2018 1403   BILITOT 0.5 05/24/2014 1034   GFRNONAA >60 12/01/2018 1403   GFRNONAA 35 (L) 05/24/2014 1034   GFRNONAA 60 (L) 11/08/2013 1115   GFRAA >60 12/01/2018 1403   GFRAA 42 (L) 05/24/2014 1034   GFRAA >60 11/08/2013 1115    No results found for: SPEP, UPEP  Lab Results  Component Value Date   WBC 7.6 12/01/2018   NEUTROABS 4.9 12/01/2018   HGB 13.0 12/01/2018   HCT 38.5 12/01/2018   MCV 89.1 12/01/2018   PLT 151 12/01/2018      Chemistry      Component Value Date/Time   NA 138 12/01/2018 1403   NA 140 05/24/2014 1034   K 3.3 (L) 12/01/2018 1403   K 3.3 (L) 05/24/2014 1034   CL 103 12/01/2018 1403   CL 97 (L) 05/24/2014 1034   CO2 26 12/01/2018 1403   CO2 30 05/24/2014 1034   BUN 11 12/01/2018 1403   BUN 19 (H) 05/24/2014 1034   CREATININE 0.79 12/01/2018 1403   CREATININE 1.55 (H) 05/24/2014 1034      Component Value Date/Time   CALCIUM 9.3 12/01/2018 1403   CALCIUM 9.6 05/24/2014 1034   ALKPHOS 64 12/01/2018 1403   ALKPHOS 98 05/24/2014 1034   AST 26 12/01/2018 1403   AST 30 05/24/2014 1034   ALT 27 12/01/2018 1403   ALT 55 05/24/2014 1034   BILITOT 0.6 12/01/2018 1403   BILITOT 0.5 05/24/2014 1034       RADIOGRAPHIC STUDIES: I have personally reviewed the radiological images as listed and agreed with the findings in the report. No results found.   ASSESSMENT & PLAN:  Ovarian cancer, right ([place]) # STAGE II right-sided Ovarian cancer status post TAH/BSO followed by adjuvant chemotherapy.  Clinically NED.  Stable.  Awaiting Ca1 2 5.  Continue alternating visits with medical oncology/gynecology oncology every 6 months.  #Difficulty swallowing-EGD fungal esophagitis/question yeast.  Reviewed the results; recommend follow-up with Dr. Gustavo Lah to discuss further treatment options.  # DISPOSITION:   follow up in 12 months-MD cbc/cmp/ca-125- Dr.B   Orders Placed This Encounter  Procedures  .  CBC with Differential    Standing Status:   Future    Standing Expiration Date:   06/02/2020  .  Comprehensive metabolic panel    Standing Status:   Future    Standing Expiration Date:   06/02/2020  . CA 125    Standing Status:   Future    Standing Expiration Date:   06/02/2020   All questions were answered. The patient knows to call the clinic with any problems, questions or concerns.      Cammie Sickle, MD 12/01/2018 8:01 PM

## 2018-12-01 NOTE — Assessment & Plan Note (Addendum)
#   STAGE II right-sided Ovarian cancer status post TAH/BSO followed by adjuvant chemotherapy.  Clinically NED.  Stable.  Awaiting Ca1 2 5.  Continue alternating visits with medical oncology/gynecology oncology every 6 months.  #Difficulty swallowing-EGD fungal esophagitis/question yeast.  Reviewed the results; recommend follow-up with Dr. Gustavo Lah to discuss further treatment options.  # DISPOSITION:   follow up in 12 months-MD cbc/cmp/ca-125- Dr.B

## 2018-12-02 ENCOUNTER — Encounter: Payer: Self-pay | Admitting: *Deleted

## 2018-12-02 LAB — CA 125: Cancer Antigen (CA) 125: 11.5 U/mL (ref 0.0–38.1)

## 2019-01-04 ENCOUNTER — Ambulatory Visit
Admission: RE | Admit: 2019-01-04 | Discharge: 2019-01-04 | Disposition: A | Discharge status: Home / Self Care | Payer: Medicare Other | Source: Ambulatory Visit | Attending: Internal Medicine | Admitting: Internal Medicine

## 2019-01-04 DIAGNOSIS — Z1231 Encounter for screening mammogram for malignant neoplasm of breast: Secondary | ICD-10-CM | POA: Diagnosis not present

## 2019-05-04 ENCOUNTER — Inpatient Hospital Stay: Payer: Medicare Other

## 2019-06-08 ENCOUNTER — Inpatient Hospital Stay: Payer: Medicare Other

## 2019-06-08 ENCOUNTER — Inpatient Hospital Stay: Payer: Medicare Other | Attending: Internal Medicine | Admitting: Nurse Practitioner

## 2019-06-08 ENCOUNTER — Other Ambulatory Visit: Payer: Self-pay

## 2019-06-08 VITALS — BP 131/66 | HR 58 | Temp 96.6°F | Resp 18 | Wt 175.2 lb

## 2019-06-08 DIAGNOSIS — I10 Essential (primary) hypertension: Secondary | ICD-10-CM | POA: Diagnosis not present

## 2019-06-08 DIAGNOSIS — Z90722 Acquired absence of ovaries, bilateral: Secondary | ICD-10-CM | POA: Insufficient documentation

## 2019-06-08 DIAGNOSIS — Z8543 Personal history of malignant neoplasm of ovary: Secondary | ICD-10-CM | POA: Insufficient documentation

## 2019-06-08 DIAGNOSIS — E669 Obesity, unspecified: Secondary | ICD-10-CM | POA: Insufficient documentation

## 2019-06-08 DIAGNOSIS — C569 Malignant neoplasm of unspecified ovary: Secondary | ICD-10-CM

## 2019-06-08 DIAGNOSIS — Z9221 Personal history of antineoplastic chemotherapy: Secondary | ICD-10-CM | POA: Insufficient documentation

## 2019-06-08 NOTE — Progress Notes (Signed)
Gynecologic Oncology Interval Visit   Primary Care Provider: Rusty Aus, MD [place] [facility] [place], [place] [address] [phone]  Referring MD: Dr. Ferne Reus (no longer here). Rusty Aus, MD as PCP - General (Internal Medicine)  Cammie Sickle, MD - Oncology  Chief Concern: Ovarian Cancer surveillance  Subjective:  Leah Santos is a 79 y.o. female who returns to clinic today for continued ovarian cancer surveillance.  She was diagnosed with left ovarian cancer, adenocarcinoma, stage IIB s/p bilateral oophorectomy, omentectomy, lymph node dissection 12/21/12 followed by carbo-Taxol started on 01/2013.  She suffered an allergic reaction to Taxol with the second cycle and was switched to gemcitabine.  She completed a total of 6 cycles in February 2015. She has been NED since that time.   Today, she says that she has been diagnosed with diabetes and has lost 10 lbs intentionally with diet modification. 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 Overall she feels well and denies specific complaints. No changes in bowel movements, pain, discharge, or bleeding. 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 She does not have a regular gynecologist but continues to see DR. Brahmanday with medical oncology.   CA 125  12/01/2018 11.5  Health care maintenance: Mammogram: 01/04/2019- birads category 1: negative. Last colonoscopy in 10/2013. Has not had bone density scan.    Gynecologic Oncology History:  Leah Santos was diagnosed with left ovarian cancer, adenocarcinoma.pT2 pNO  M0, FIGOSTAGING II B. Status post bilateral oopherectomy, omentectomy, lymph node dissection on December 21, 2012.  CA 125 01/11/2013 87.1 (elevated)  CA-19-9 01/11/2013 62 (elevated)  CEA 01/11/2013 1.0 (normal)  -Started on  carboplatinum and Taxol from October, 2014. -Allergic reaction to Taxol on 2nd treatment.  Taxol was discontinued and started on gemcitabine -Patient has finished total 6 cycles of chemotherapy(initially with carboplatinum Taxol followed by carboplatinum and gemcitabine(February, 2015).  Pelvic MRI 11/21/2013  FINDINGS: Previous hysterectomy noted. Simple appearing cyst in the right adnexa shows significant decrease in size since previous study, currently measuring 1.2 x 2.5 cm on image 23 of series 9 compared to 4.5 x 5.9 cm previously. This is consistent with a resolving postoperative fluid collection such as a lymphocele or seroma. Another simple appearing cyst in the left adnexa measures 3.0 x 4.4 cm. This remains stable in size and appearance since prior exam, and is also suspicious for a postoperative fluid collection. No internal septations or solid mural nodules are visualized. IMPRESSION: 1. No acute findings or radiographic signs of pelvic metastatic disease. 2. Near complete resolution of right adnexal fluid collection, consistent with resolving postoperative lymphocele or seroma. 3. Stable simple appearing left adnexal cyst, most consistent with persistent postoperative lymphocele or seroma.  She was last seen by Dr. Rogue Bussing on 08/19/2017 with a negative exam and normal CA125.  She was complaining of abdominal distention and bloating at that time which prompted imaging.    08/26/2017- CT Chest/Abdomen/Pelvis W Contrast IMPRESSION: 1. Stable hepatic lesions are noted compared to prior exam. 2. Bilateral cystic adnexal abnormalities noted on prior exam are no longer present. 3. Possible fatty infiltration of the liver. 4. No acute abnormality is seen in the abdomen or pelvis. 5. Aortic Atherosclerosis (ICD10-I70.0).  CA 125 08/19/17  16.5 03/11/2017 18.3 08/20/2016  15.6 04/29/2018  < 0.6 12/01/2018  11.5  Genetic testing: No BRCA mutation. Daughter has  tested negative as well.     Problem List: Patient Active Problem List   Diagnosis Date Noted  . Sepsis ([place]) 12/21/2017  . Ovarian cancer, right ([place]) 01/24/2016  . Ovarian cancer ([place]) 10/24/2015  . Avitaminosis D 02/21/2015  . Acid reflux 12/27/2014  . History of colon polyps 12/27/2014  . HLD (hyperlipidemia) 12/27/2014  . BP (high blood pressure) 12/27/2014  . Obstructive apnea 12/27/2014  . History of ovarian cancer 02/16/2014  . Acquired hypothyroidism 02/16/2014    Past Medical History: Past Medical History:  Diagnosis Date  . Anemia   . Asthma   . Barrett's esophagus   . Cellulitis    Right lower extremity  . Chronic acquired lymphedema    since childhood  . Diabetes mellitus without complication ([place])   . GERD (gastroesophageal reflux disease)   . History of cellulitis   . History of chemotherapy    finished total 6 cycles of chemotherapy(initially with carboplatinum Taxol followed by carboplatinum and gemcitabine(February, 2015)  . History of colon polyps   . History of colonoscopy with polypectomy 11/07/13  . History of genital warts 1986  . History of mammogram 04/29/2014  . HTN (hypertension)   . Hypothyroidism   . IBS (irritable bowel syndrome)   . Ovarian cancer on left (H[facility])    No BRCA mutation-Left ovarian cancer, adenocarcinoma.pT2 pNO  M0     Past Surgical History: Past Surgical History:  Procedure Laterality Date  . BREAST BIOPSY Right 1994   neg  . BREAST EXCISIONAL BIOPSY Right 1962  . CHOLECYSTECTOMY  1996  . COLONOSCOPY W/ POLYPECTOMY  11/07/13  . COLONOSCOPY WITH PROPOFOL N/A 11/29/2018   Procedure: COLONOSCOPY WITH PROPOFOL;  Surgeon: Lollie Sails, MD;  Location[facility] ENDOSCOPY;  Service: Endoscopy;  Laterality: N/A;  . ESOPHAGOGASTRODUODENOSCOPY (EGD) WITH PROPOFOL N/A 11/29/2018   Procedure: ESOPHAGOGASTRODUODENOSCOPY (EGD) WITH PROPOFOL;  Surgeon: Lollie Sails, MD;  Location: [facility] ENDOSCOPY;  Service: Endoscopy;  Laterality: N/A;  .  ESOPHAGOGASTRODUODENOSCOPY ENDOSCOPY  11/07/13  . Exp. Laparotomy, TAH, Salpingo-oophorectomy, Appendectomy  12/2012  . [place]  . TUBAL LIGATION      Past Gynecologic History:  As per HPI  OB History:  OB History  Gravida Para Term Preterm AB Living  _0 SAB TAB Ectopic Multiple Live Births               # Outcome Date GA Lbr Len/2nd Weight Sex Delivery Anes PTL Lv  3 Term           2 Term           1 Term             Obstetric Comments  Onset of menses at age 52    Family History: Family History  Problem Relation Age of Onset  . Ovarian cancer Mother 72  . Breast cancer Maternal Aunt   . Breast cancer Maternal Aunt 70  . Lung cancer Maternal Uncle 75  . Prostate cancer Maternal Uncle 65  . Uterine cancer Maternal Aunt 41    Social History: Social History   Socioeconomic History  . Marital status: Married    Spouse name: Not on file  . Number of children: Not on file  . Years of education: Not on file  . Highest education level: Not on file  Occupational History  . Not on file  Tobacco Use  . Smoking status: Never Smoker  . Smokeless  tobacco: Never Used  Substance and Sexual Activity  . Alcohol use: No    Alcohol/week: 0.0 standard drinks  . Drug use: No  . Sexual activity: Not on file  Other Topics Concern  . Not on file  Social History Narrative  . Not on file   Social Determinants of Health   Financial Resource Strain:   . Difficulty of Paying Living Expenses: Not on file  Food Insecurity:   . Worried About Charity fundraiser in the Last Year: Not on file  . Ran Out of Food in the Last Year: Not on file  Transportation Needs:   . Lack of Transportation (Medical): Not on file  . Lack of Transportation (Non-Medical): Not on file  Physical Activity:   . Days of Exercise per Week: Not on file  . Minutes of Exercise per Session: Not on file  Stress:   . Feeling of Stress : Not on file  Social Connections:   .  Frequency of Communication with Friends and Family: Not on file  . Frequency of Social Gatherings with Friends and Family: Not on file  . Attends Religious Services: Not on file  . Active Member of Clubs or Organizations: Not on file  . Attends Archivist Meetings: Not on file  . Marital Status: Not on file  Intimate Partner Violence:   . Fear of Current or Ex-Partner: Not on file  . Emotionally Abused: Not on file  . Physically Abused: Not on file  . Sexually Abused: Not on file    Allergies: Allergies  Allergen Reactions  . Statins Other (See Comments)    Muscle cramps  . Taxol [Paclitaxel]     Allergic reaction to Taxol ON 2nd treatment.  Taxol was discontinued and started on gemcitabine   . Sulfa Antibiotics Rash  . Tape Rash    Skin irritation with prolonged use (bandages with latex)    Current Medications: Current Outpatient Medications  Medication Sig Dispense Refill  . Cholecalciferol (VITAMIN D3) 1000 UNITS CAPS Take 1 tablet by mouth daily.    . cycloSPORINE (RESTASIS) 0.05 % ophthalmic emulsion Place 1 drop into both eyes 2 (two) times daily.    Marland Kitchen escitalopram (LEXAPRO) 10 MG tablet Take 5-10 mg by mouth daily.    . fluticasone (FLONASE) 50 MCG/ACT nasal spray Place 2 sprays into both nostrils daily as needed for allergies or rhinitis.    Marland Kitchen levothyroxine (SYNTHROID, LEVOTHROID) 50 MCG tablet Take 75 mcg by mouth daily before breakfast.     . Multiple Vitamins-Minerals (CENTRUM SILVER ADULT 50+ PO) Take 1 tablet by mouth daily.    Marland Kitchen omeprazole (PRILOSEC) 40 MG capsule Take 1 capsule by mouth daily.    . potassium chloride (KLOR-CON 10) 10 MEQ tablet Take 40 mEq by mouth daily.     Marland Kitchen triamterene-hydrochlorothiazide (MAXZIDE-25) 37.5-25 MG per tablet Take 1 tablet by mouth daily.     No current facility-administered medications for this visit.   Facility-Administered Medications Ordered in Other Visits  Medication Dose Route Frequency Provider Last Rate  Last Admin  . heparin lock flush 100 unit/mL  500 Units Intravenous Once Choksi, Janak, MD      . sodium chloride 0.9 % injection 10 mL  10 mL Intravenous PRN Forest Gleason, MD   10 mL at 11/21/14 1108  . sodium chloride 0.9 % injection 10 mL  10 mL Intravenous PRN Forest Gleason, MD   10 mL at 03/21/15 1044    Review of Systems  General:  Fatigue improved; intentional weight loss Skin: no complaints Eyes: no complaints HEENT: no complaints Breasts: no complaints Pulmonary: no complaints Cardiac: no complaints Gastrointestinal: no complaints Genitourinary/Sexual: no complaints Ob/Gyn: no complaints Musculoskeletal: no complaints Hematology: no complaints Neurologic/Psych: no complaints   Objective:  Physical Examination:  There were no vitals taken for this visit.   ECOG Performance Status: 0 - Asymptomatic  GENERAL: Patient is a well appearing female in no acute distress HEENT:  Sclera clear. Anicteric NODES:  Negative axillary, supraclavicular, inguinal lymph node survery LUNGS:  Clear to auscultation bilaterally.   HEART:  Regular rate and rhythm.  ABDOMEN:  Soft, nontender.  Incisions well healed. No masses or ascites. Hernia at umbilicus and extending superiorly millimeters inferiorly at least 12 cm in length.  EXTREMITIES:  No peripheral edema. Atraumatic. No cyanosis SKIN:  Clear with no obvious rashes or skin changes.  NEURO:  Nonfocal. Well oriented.  Appropriate affect.   Pelvic: exam chaperoned by nurse;  Vulva: normal appearing vulva with no masses; Vagina: normal vagina; Adnexa: surgically absent; Uterus: surgically absent, vaginal cuff well healed; Cervix: absent; Rectal: deferred  Lab Review Labs on site today: pending CA 125  Radiologic Imaging No imaging ordered today    Assessment:  Leah Santos is a 79 y.o. female diagnosed with stage II left ovarian cancer, adenocarcinoma, status post bilateral oophorectomy, omentectomy, lymph node dissection on  12/21/2012 followed by carbo-Taxol started on 01/2013.  She suffered an allergic reaction to Taxol with the second cycle and was switched to gemcitabine.  She completed a total of 6 cycles in February 2015.  Clinically, no evidence of recurrent disease today.  There is no height or weight on file to calculate BMI. Incisional ventral hernia, asymptomatic.    Medical co-morbidities complicating care: HTN, obesity and prior abdominal surgery. Diabetes Mellitus Plan:   Problem List Items Addressed This Visit      Endocrine   Ovarian cancer, right ([place]) - Primary     CA 125 pending today. Was elevated at time of diagnosis so would recommend checking annually with her appointments as this may be good marker for her.   We discussed surveillance guidelines and she is now 6 years out from completion of chemotherapy and has had no evidence of recurrent disease. No evidence of disease today. Recommended continued annual surveillance with pelvic exam. Offered referral to gynecology but patient wishes to continue to receive care at the [facility].   Encouraged annual mammograms and bi-annual bone density scans with her PCP. She has not had a bone density scan. I encouraged weight bearing exercise, calcium and vitamin d supplementation.   We again discussed the role of maintaining a healthy BMI and I applauded her on her recent intentional weight loss, stressed good nutrition and exercise for all cancer survivors. I advised that if she develops concerning symptoms in the interim, to please call the clinic so that we could see her sooner.   I discussed the assessment and treatment plan with the patient. The patient was provided an opportunity to ask questions and all were answered. The patient agreed with the plan and demonstrated an understanding of the instructions.  Beckey Rutter, DNP, AGNP-C [place] at [facility] [phone] (clinic)  CC:  Rusty Aus, MD [facility] [place], [place] [address] [phone]

## 2019-06-09 LAB — CA 125: Cancer Antigen (CA) 125: 10.4 U/mL (ref 0.0–38.1)

## 2019-07-06 ENCOUNTER — Ambulatory Visit: Payer: Medicare Other | Admitting: *Deleted

## 2019-07-13 ENCOUNTER — Other Ambulatory Visit: Payer: Self-pay

## 2019-07-13 ENCOUNTER — Encounter: Payer: Medicare Other | Attending: Internal Medicine | Admitting: *Deleted

## 2019-07-13 ENCOUNTER — Encounter: Payer: Self-pay | Admitting: *Deleted

## 2019-07-13 VITALS — BP 120/64 | Ht 63.0 in | Wt 170.5 lb

## 2019-07-13 DIAGNOSIS — E1165 Type 2 diabetes mellitus with hyperglycemia: Secondary | ICD-10-CM | POA: Insufficient documentation

## 2019-07-13 DIAGNOSIS — Z713 Dietary counseling and surveillance: Secondary | ICD-10-CM | POA: Diagnosis not present

## 2019-07-13 DIAGNOSIS — E119 Type 2 diabetes mellitus without complications: Secondary | ICD-10-CM

## 2019-07-13 NOTE — Progress Notes (Signed)
Diabetes Self-Management Education  Visit Type: First/Initial  Appt. Start Time: 0900 Appt. End Time: 1010  07/13/2019  [name], identified by name and date of birth, is a 79 y.o. female with a diagnosis of Diabetes: Type 2.   ASSESSMENT  Blood pressure 120/64, height 5\' 3"  (1.6 m), weight 170 lb 8 oz (77.3 kg). Body mass index is 30.2 kg/m.  Diabetes Self-Management Education - 07/13/19 1156      Visit Information   Visit Type  First/Initial      Initial Visit   Diabetes Type  Type 2    Are you currently following a meal plan?  Yes    What type of meal plan do you follow?  "have cut out sugar and lost 15 lbs in 2 months"    Are you taking your medications as prescribed?  Yes    Date Diagnosed  2 months      Health Coping   How would you rate your overall health?  Good      Psychosocial Assessment   Patient Belief/Attitude about Diabetes  Motivated to manage diabetes   "I need to lose weight, exercise more to feel better"   Self-care barriers  Other (comment)   caregiver for her husband and he can't be left alone for long periods   Self-management support  Doctor's office   she has a caregiver that stays with her husband for a few hours 3 x week   Patient Concerns  Nutrition/Meal planning;Glycemic Control;Medication;Monitoring;Healthy Lifestyle;Weight Control    Special Needs  None    Preferred Learning Style  Visual;Other (comment)   writing down info   Learning Readiness  Change in progress    How often do you need to have someone help you when you read instructions, pamphlets, or other written materials from your doctor or pharmacy?  1 - Never    What is the last grade level you completed in school?  some college      Pre-Education Assessment   Patient understands the diabetes disease and treatment process.  Needs Instruction    Patient understands incorporating nutritional management into lifestyle.  Needs Instruction    Patient undertands incorporating  physical activity into lifestyle.  Needs Instruction    Patient understands using medications safely.  Needs Instruction    Patient understands monitoring blood glucose, interpreting and using results  Needs Instruction    Patient understands prevention, detection, and treatment of acute complications.  Needs Instruction    Patient understands prevention, detection, and treatment of chronic complications.  Needs Instruction    Patient understands how to develop strategies to address psychosocial issues.  Needs Instruction    Patient understands how to develop strategies to promote health/change behavior.  Needs Instruction      Complications   Last HgB A1C per patient/outside source  7 %   05/03/2019   How often do you check your blood sugar?  Patient declines   BG in the office today was 150 mg/dL at 10:05 am - 2 hrs pp.   Have you had a dilated eye exam in the past 12 months?  Yes    Have you had a dental exam in the past 12 months?  No    Are you checking your feet?  Yes   Pt has history of foot infections for years prior to diagnosis of diabetes.   How many days per week are you checking your feet?  5      Dietary Intake  Breakfast  egg biscuit; eggs, toast and [place] sausage; Raisin bran cereal with milk and banana    Snack (morning)  cottage cheese and fruit (she eats all fruits); peanut butter crackers, nuts    Lunch  tuna sandwich, fruit; hamburger; boiled shrimp with salad    Snack (afternoon)  peanut butter or same as morning snack    Dinner  chicken, beef, pork, baked salmon weekly; soup, potatoes, peas, beans, corn, green beans, pasta, eggplant parmesean; all vegetables - lettuce, tomatoes, carrots, cuccumbers, peppers, broccoli, cauliflower, greens, cabbage, brussel sprouts    Beverage(s)  water, grape juice, unsweet tea and coffee      Exercise   Exercise Type  Light (walking / raking leaves)   walks her dog but also reports getting 8-10,000 steps on most days taking care  of her husband   How many days per week to you exercise?  3    How many minutes per day do you exercise?  20    Total minutes per week of exercise  60      Patient Education   Previous Diabetes Education  No    Disease state   Definition of diabetes, type 1 and 2, and the diagnosis of diabetes;Factors that contribute to the development of diabetes    Nutrition management   Role of diet in the treatment of diabetes and the relationship between the three main macronutrients and blood glucose level;Food label reading, portion sizes and measuring food.    Physical activity and exercise   Role of exercise on diabetes management, blood pressure control and cardiac health.    Medications  Reviewed patients medication for diabetes, action, purpose, timing of dose and side effects.    Monitoring  Identified appropriate SMBG and/or A1C goals.    Chronic complications  Relationship between chronic complications and blood glucose control;Assessed and discussed foot care and prevention of foot problems    Psychosocial adjustment  Role of stress on diabetes;Identified and addressed patients feelings and concerns about diabetes      Individualized Goals (developed by patient)   Reducing Risk  Other (comment)   improve blood sugars, decrease medications, prevent diabetes complications, lose weight, lead a healthier lifestyle, become more fit     Outcomes   Expected Outcomes  Demonstrated interest in learning. Expect positive outcomes       Individualized Plan for Diabetes Self-Management Training:   Learning Objective:  Patient will have a greater understanding of diabetes self-management. Patient education plan is to attend individual and/or group sessions per assessed needs and concerns.   Plan:   Patient Instructions  Exercise: Continue walking for   20  minutes  3  days a week and gradually increase for 30 minutes 5 x week Eat 3 meals day,  1-2  snacks a day Space meals 4-6 hours apart Avoid  sugar sweetened drinks (juices)  Expected Outcomes:  Demonstrated interest in learning. Expect positive outcomes  Education material provided:  General Meal Planning Guidelines Simple Meal Plan  If problems or questions, patient to contact team via:  Leah Drilling, RN, CCM, [place] [phone]  Future DSME appointment:  Patient will check her calendar and see if she can arrange caregivers for her husband before scheduling classes. She was provided dates for the morning series for April and May.

## 2019-07-13 NOTE — Patient Instructions (Signed)
Exercise: Continue walking for   20  minutes  3  days a week and gradually increase for 30 minutes 5 x week  Eat 3 meals day,  1-2  snacks a day Space meals 4-6 hours apart Avoid sugar sweetened drinks (juices)  Return for classes on:

## 2019-07-15 ENCOUNTER — Telehealth: Payer: Self-pay | Admitting: *Deleted

## 2019-07-15 NOTE — Telephone Encounter (Signed)
Received call from patient. She reports that she can start morning classes beginning May 6.

## 2019-07-27 ENCOUNTER — Other Ambulatory Visit: Payer: Self-pay

## 2019-07-27 ENCOUNTER — Ambulatory Visit (INDEPENDENT_AMBULATORY_CARE_PROVIDER_SITE_OTHER): Payer: Medicare Other | Admitting: Dermatology

## 2019-07-27 DIAGNOSIS — D229 Melanocytic nevi, unspecified: Secondary | ICD-10-CM

## 2019-07-27 DIAGNOSIS — D692 Other nonthrombocytopenic purpura: Secondary | ICD-10-CM

## 2019-07-27 DIAGNOSIS — Z1283 Encounter for screening for malignant neoplasm of skin: Secondary | ICD-10-CM

## 2019-07-27 DIAGNOSIS — L570 Actinic keratosis: Secondary | ICD-10-CM

## 2019-07-27 DIAGNOSIS — L821 Other seborrheic keratosis: Secondary | ICD-10-CM

## 2019-07-27 DIAGNOSIS — L820 Inflamed seborrheic keratosis: Secondary | ICD-10-CM

## 2019-07-27 DIAGNOSIS — L918 Other hypertrophic disorders of the skin: Secondary | ICD-10-CM

## 2019-07-27 DIAGNOSIS — L304 Erythema intertrigo: Secondary | ICD-10-CM

## 2019-07-27 DIAGNOSIS — D1800 Hemangioma unspecified site: Secondary | ICD-10-CM

## 2019-07-27 DIAGNOSIS — Z86018 Personal history of other benign neoplasm: Secondary | ICD-10-CM

## 2019-07-27 DIAGNOSIS — L578 Other skin changes due to chronic exposure to nonionizing radiation: Secondary | ICD-10-CM

## 2019-07-27 DIAGNOSIS — L814 Other melanin hyperpigmentation: Secondary | ICD-10-CM

## 2019-07-27 MED ORDER — HYDROCORTISONE 2.5 % EX CREA: TOPICAL_CREAM | CUTANEOUS | 2 refills | Status: DC

## 2019-07-27 MED ORDER — KETOCONAZOLE 2 % EX CREA: 1.0000 "application " | TOPICAL_CREAM | Freq: Two times a day (BID) | CUTANEOUS | 1 refills | Status: DC

## 2019-07-27 NOTE — Patient Instructions (Addendum)
Recommend daily broad spectrum sunscreen SPF 30+ to sun-exposed areas, reapply every 2 hours as needed. Call for new or changing lesions.  Can use use OTC zinc oxide cream, or OTC antifungal powder Keep area dry and less rubbing can use a hair dryer to help dry area on low setting Start ketoconazole twice daily as need then stop when area is clear. Will send in Clortrimazole if ketoconazole is to expensive  Ordered Medications: ketoconazole (NIZORAL) 2 % cream  Liquid nitrogen was applied for 10-12 seconds to the skin lesion and the expected blistering or scabbing reaction explained. Do not pick at the area. Patient reminded to expect hypopigmented scars from the procedure. Return if lesion fails to fully resolved.  Prior to procedure, discussed risks of blister formation, small wound, skin dyspigmentation, or rare scar following cryotherapy.

## 2019-07-27 NOTE — Progress Notes (Signed)
Follow-Up Visit   Subjective  Leah Santos is a 79 y.o. female who presents for the following:  Patient is here for skin cancer screening and mole check.   She has a spot on back for years that we are observing. She has a rash under breast and abdomen, under abdomen is currently irritated. She wants her nose checked. She has a new diagnoses of diabetes type II.   The following portions of the chart were reviewed this encounter and updated as appropriate: Tobacco  Allergies  Meds  Problems  Med Hx  Surg Hx  Fam Hx      Review of Systems: No other skin or systemic complaints.  Objective  Well appearing patient in no apparent distress; mood and affect are within normal limits.  A full examination was performed including scalp, head, eyes, ears, nose, lips, neck, chest, axillae, abdomen, back, buttocks, bilateral upper extremities, bilateral lower extremities, hands, feet, fingers, toes, fingernails, and toenails. All findings within normal limits unless otherwise noted below.  Objective  Left Forehead: Erythematous thin papules/macules with gritty scale.   Objective  Bilateral Inframammary Fold, lower abdomen: Macerated pink patches  Objective  Mid Back: Scar with no evidence of recurrence.   Objective  lower abdomen: Erythematous keratotic or waxy stuck-on papule or plaque.   Assessment & Plan  AK (actinic keratosis) Left Forehead  Prior to procedure, discussed risks of blister formation, small wound, skin dyspigmentation, or rare scar following cryotherapy.    Destruction of lesion - Left Forehead  Destruction method: cryotherapy   Informed consent: discussed and consent obtained   Lesion destroyed using liquid nitrogen: Yes   Outcome: patient tolerated procedure well with no complications   Post-procedure details: wound care instructions given    Erythema intertrigo (2) Bilateral Inframammary Fold; lower abdomen  Chronic, recurrent.  Can use use  OTC zinc oxide cream, or OTC antifungal powder to help prevent recurrence  Keep area dry  Start ketoconazole twice daily as need then stop when area is clear.  Ordered Medications: ketoconazole (NIZORAL) 2 % cream hydrocortisone 2.5 % cream  History of dysplastic nevus Mid Back  Clear. Observe for recurrence. Call clinic for new or changing lesions.  Recommend regular skin exams, daily broad-spectrum spf 30+ sunscreen use, and photoprotection.     Inflamed seborrheic keratosis lower abdomen  Defers Ln2 treatment at this time  Skin cancer screening performed today.  Seborrheic Keratoses - Stuck-on, waxy, tan-brown papules and plaques  - Discussed benign etiology and prognosis. - Observe - Call for any changes  Hemangiomas - Red papules - Discussed benign nature - Observe - Call for any changes  Melanocytic Nevi - Tan-brown and/or pink-flesh-colored symmetric macules and papules - Benign appearing on exam today - Observation - Call clinic for new or changing moles - Recommend daily use of broad spectrum spf 30+ sunscreen to sun-exposed areas.   Lentigines - Scattered tan macules - Discussed due to sun exposure - Benign, observe - Call for any changes  Purpura - Violaceous macules and patches - Benign - Related to age, sun damage and/or use of blood thinners - Observe - Can use OTC arnica containing moisturizer such as Dermend Bruise Formula if desired - Call for worsening or other concerns  Skin tag - Benign appearing - Fleshy skin colored papule - No treatment needed - Call for new or changing lesions  Actinic Damage - diffuse scaly erythematous macules with underlying dyspigmentation - Recommend daily broad spectrum sunscreen SPF 30+ to  sun-exposed areas, reapply every 2 hours as needed.  - Call for new or changing lesions   Return in about 1 year (around 07/26/2020) for TBSE, call for new or changing spots.   IDonzetta Kohut, CMA, am acting as  scribe for Forest Gleason, MD .  Documentation: I have reviewed the above documentation for accuracy and completeness, and I agree with the above.  Forest Gleason, MD

## 2019-08-25 ENCOUNTER — Other Ambulatory Visit: Payer: Self-pay

## 2019-08-25 ENCOUNTER — Encounter: Payer: Self-pay | Admitting: Dietician

## 2019-08-25 ENCOUNTER — Encounter: Payer: Medicare Other | Attending: Internal Medicine | Admitting: Dietician

## 2019-08-25 VITALS — Ht 63.0 in | Wt 173.1 lb

## 2019-08-25 DIAGNOSIS — Z713 Dietary counseling and surveillance: Secondary | ICD-10-CM | POA: Insufficient documentation

## 2019-08-25 DIAGNOSIS — E119 Type 2 diabetes mellitus without complications: Secondary | ICD-10-CM

## 2019-08-25 DIAGNOSIS — E1165 Type 2 diabetes mellitus with hyperglycemia: Secondary | ICD-10-CM | POA: Diagnosis not present

## 2019-08-25 NOTE — Progress Notes (Signed)

## 2019-08-26 ENCOUNTER — Encounter: Payer: Self-pay | Admitting: Dermatology

## 2019-09-01 ENCOUNTER — Encounter: Payer: Medicare Other | Admitting: *Deleted

## 2019-09-01 ENCOUNTER — Other Ambulatory Visit: Payer: Self-pay

## 2019-09-01 ENCOUNTER — Encounter: Payer: Self-pay | Admitting: *Deleted

## 2019-09-01 VITALS — Wt 172.4 lb

## 2019-09-01 DIAGNOSIS — Z713 Dietary counseling and surveillance: Secondary | ICD-10-CM | POA: Diagnosis not present

## 2019-09-01 DIAGNOSIS — E119 Type 2 diabetes mellitus without complications: Secondary | ICD-10-CM

## 2019-09-01 NOTE — Progress Notes (Signed)

## 2019-09-08 ENCOUNTER — Telehealth: Payer: Self-pay | Admitting: *Deleted

## 2019-09-08 ENCOUNTER — Ambulatory Visit: Payer: Medicare Other

## 2019-09-08 NOTE — Telephone Encounter (Signed)
Received voice mail from patient that she would not be able to attend Diabetes class today. She is not able to get someone to stay with her husband. She requested the time for the next Class 3. Called her and left a message that the next morning Class 3 would be June 17.

## 2019-10-04 ENCOUNTER — Telehealth: Payer: Self-pay | Admitting: *Deleted

## 2019-10-04 NOTE — Telephone Encounter (Signed)
Patient left a voice mail that she would not be able to attend Class 3 this Thurs because the lady that sits with her husband will be having a scan that day. Called patient back and gave her the next Class 3 morning session for July 22. She reports that she will try to make that last class.

## 2019-10-06 ENCOUNTER — Ambulatory Visit: Payer: Medicare Other

## 2019-11-10 ENCOUNTER — Other Ambulatory Visit: Payer: Self-pay

## 2019-11-10 ENCOUNTER — Encounter: Payer: Medicare Other | Attending: Internal Medicine | Admitting: Dietician

## 2019-11-10 ENCOUNTER — Encounter: Payer: Self-pay | Admitting: Dietician

## 2019-11-10 VITALS — BP 148/74 | Ht 63.0 in | Wt 175.8 lb

## 2019-11-10 DIAGNOSIS — E119 Type 2 diabetes mellitus without complications: Secondary | ICD-10-CM

## 2019-11-10 DIAGNOSIS — E1165 Type 2 diabetes mellitus with hyperglycemia: Secondary | ICD-10-CM | POA: Diagnosis not present

## 2019-11-10 DIAGNOSIS — Z713 Dietary counseling and surveillance: Secondary | ICD-10-CM | POA: Diagnosis present

## 2019-11-10 NOTE — Progress Notes (Signed)
Appt. Start Time: 900 Appt. End Time: 1200  Class 3 Diabetes Overview - identify functions of pancreas and insulin; define insulin deficiency vs insulin resistance  Medications - state name, dose, timing of currently prescribed medications; describe types of medications available for diabetes  Psychosocial - identify DM as a source of stress; state the effects of stress on BG control; verbalize appropriate stress management techniques; identify personal stress issues   Nutritional Management - use food labels to identify serving size, content of carbohydrate, fiber, protein, fat, saturated fat and sodium; recognize food sources of fat, saturated fat, trans fat, and sodium, and verbalize goals for intake; describe healthful, appropriate food choices when dining out   Exercise - state a plan for personal exercise; verbalize contraindications for exercise  Self-Monitoring - state importance of SMBG; use SMBG results to effectively manage diabetes; identify importance of regular HbA1C testing and goals for results  Acute Complications - recognize hyperglycemia and hypoglycemia with causes and effects; identify blood glucose results as high, low or in control; list steps in treating and preventing high and low blood glucose  Chronic Complications - state importance of daily self-foot exams; explain appropriate eye and dental care  Lifestyle Changes/Goals & Health/Community Resources - set goals for proper diabetes care; state need for and frequency of healthcare follow-up; describe appropriate community resources for good health (ADA, web sites, apps)   Teaching Materials Used: Class 3 Slide Packet Diabetes Stress Test Stress Management Tools Stress Poem Goal Setting Worksheet Website/App List    

## 2019-11-17 ENCOUNTER — Encounter: Payer: Self-pay | Admitting: *Deleted

## 2019-12-02 ENCOUNTER — Inpatient Hospital Stay: Payer: Medicare Other | Attending: Internal Medicine

## 2019-12-02 ENCOUNTER — Inpatient Hospital Stay (HOSPITAL_BASED_OUTPATIENT_CLINIC_OR_DEPARTMENT_OTHER): Payer: Medicare Other | Admitting: Internal Medicine

## 2019-12-02 ENCOUNTER — Other Ambulatory Visit: Payer: Self-pay

## 2019-12-02 ENCOUNTER — Encounter: Payer: Self-pay | Admitting: Internal Medicine

## 2019-12-02 DIAGNOSIS — C561 Malignant neoplasm of right ovary: Secondary | ICD-10-CM

## 2019-12-02 LAB — CBC WITH DIFFERENTIAL/PLATELET
Abs Immature Granulocytes: 0.03 10*3/uL (ref 0.00–0.07)
Basophils Absolute: 0 10*3/uL (ref 0.0–0.1)
Basophils Relative: 1 %
Eosinophils Absolute: 0.2 10*3/uL (ref 0.0–0.5)
Eosinophils Relative: 3 %
HCT: 41.9 % (ref 36.0–46.0)
Hemoglobin: 14.6 g/dL (ref 12.0–15.0)
Immature Granulocytes: 0 %
Lymphocytes Relative: 27 %
Lymphs Abs: 2.1 10*3/uL (ref 0.7–4.0)
MCH: 30.4 pg (ref 26.0–34.0)
MCHC: 34.8 g/dL (ref 30.0–36.0)
MCV: 87.1 fL (ref 80.0–100.0)
Monocytes Absolute: 0.5 10*3/uL (ref 0.1–1.0)
Monocytes Relative: 6 %
Neutro Abs: 4.9 10*3/uL (ref 1.7–7.7)
Neutrophils Relative %: 63 %
Platelets: 176 10*3/uL (ref 150–400)
RBC: 4.81 MIL/uL (ref 3.87–5.11)
RDW: 13.3 % (ref 11.5–15.5)
WBC: 7.9 10*3/uL (ref 4.0–10.5)
nRBC: 0 % (ref 0.0–0.2)

## 2019-12-02 LAB — COMPREHENSIVE METABOLIC PANEL
ALT: 21 U/L (ref 0–44)
AST: 22 U/L (ref 15–41)
Albumin: 3.9 g/dL (ref 3.5–5.0)
Alkaline Phosphatase: 69 U/L (ref 38–126)
Anion gap: 13 (ref 5–15)
BUN: 17 mg/dL (ref 8–23)
CO2: 25 mmol/L (ref 22–32)
Calcium: 9.1 mg/dL (ref 8.9–10.3)
Chloride: 101 mmol/L (ref 98–111)
Creatinine, Ser: 0.83 mg/dL (ref 0.44–1.00)
GFR calc Af Amer: >60 mL/min (ref >60)
GFR calc non Af Amer: >60 mL/min (ref >60)
Glucose, Bld: 147 mg/dL — ABNORMAL HIGH (ref 70–99)
Potassium: 3.5 mmol/L (ref 3.5–5.1)
Sodium: 139 mmol/L (ref 135–145)
Total Bilirubin: 0.8 mg/dL (ref 0.3–1.2)
Total Protein: 7 g/dL (ref 6.5–8.1)

## 2019-12-02 NOTE — Progress Notes (Signed)
[place] OFFICE PROGRESS NOTE  Patient Care Team: Rusty Aus, MD as PCP - General (Internal Medicine) Clent Jacks, RN as Registered Nurse  Cancer Staging No matching staging information was found for the patient.  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 [Dr.Secord]  # BRCA 1& 2- NEG   #History of Barrett's esophagus [August 3646]; GI [facility]   Ovarian cancer, right ([place])  01/24/2016 Initial Diagnosis   Ovarian cancer, right ([place])      INTERVAL HISTORY:  Leah Santos 79 y.o.  female pleasant patient above history of Ovarian cancer stage II surgery followed by adjuvant chemotherapy is here for follow-up.    Patient appetite is good with no weight loss no nausea vomiting.  No chest pain or shortness with cough.   Review of Systems  Constitutional: Negative for chills, diaphoresis, fever, malaise/fatigue and weight loss.  HENT: Negative for nosebleeds and sore throat.   Eyes: Negative for double vision.  Respiratory: Negative for cough, hemoptysis, sputum production, shortness of breath and wheezing.   Cardiovascular: Negative for chest pain, palpitations, orthopnea and leg swelling.  Gastrointestinal: Negative for abdominal pain, blood in stool, constipation, diarrhea, heartburn, melena, nausea and vomiting.  Genitourinary: Negative for dysuria, frequency and urgency.  Musculoskeletal: Positive for back pain and joint pain.  Skin: Negative.  Negative for itching and rash.  Neurological: Negative for dizziness, tingling, focal weakness, weakness and  headaches.  Endo/Heme/Allergies: Does not bruise/bleed easily.  Psychiatric/Behavioral: Negative for depression. The patient is not nervous/anxious and does not have insomnia.      PAST MEDICAL HISTORY :  Past Medical History:  Diagnosis Date  . Anemia   . Asthma   . Barrett's esophagus   . Cellulitis    Right lower extremity  . Chronic acquired lymphedema    since childhood  . Diabetes mellitus without complication ([place])   . GERD (gastroesophageal reflux disease)   . History of cellulitis   . History of chemotherapy    finished total 6 cycles of chemotherapy(initially with carboplatinum Taxol followed by carboplatinum and gemcitabine(February, 2015)  . History of colon polyps   . History of colonoscopy with polypectomy 11/07/13  . History of genital warts 1986  . History of mammogram 04/29/2014  . HTN (hypertension)   . Hx of dysplastic nevus 07/21/2007   Mid back. Moderate atypia, changes extend focally to one edge.  . Hypothyroidism   . IBS (irritable bowel syndrome)   . Ovarian cancer on left ([facility])    No BRCA mutation-Left ovarian cancer, adenocarcinoma.pT2 pNO  M0     PAST SURGICAL HISTORY :   Past Surgical History:  Procedure Laterality Date  . BREAST BIOPSY Right 1994   neg  . BREAST EXCISIONAL BIOPSY Right 1962  . CHOLECYSTECTOMY  1996  . COLONOSCOPY W/ POLYPECTOMY  11/07/13  . COLONOSCOPY WITH PROPOFOL N/A 11/29/2018   Procedure: COLONOSCOPY WITH PROPOFOL;  Surgeon: Lollie Sails, MD;  Location[facility] ENDOSCOPY;  Service: Endoscopy;  Laterality: N/A;  . ESOPHAGOGASTRODUODENOSCOPY (EGD) WITH PROPOFOL N/A 11/29/2018  Procedure: ESOPHAGOGASTRODUODENOSCOPY (EGD) WITH PROPOFOL;  Surgeon: Lollie Sails, MD;  [facility]COPY;  Service: Endoscopy;  Laterality: N/A;  . ESOPHAGOGASTRODUODENOSCOPY ENDOSCOPY  11/07/13  . Exp. Laparotomy, TAH, Salpingo-oophorectomy, Appendectomy  12/2012  . [place]  . TUBAL LIGATION      FAMILY HISTORY :    Family History  Problem Relation Age of Onset  . Ovarian cancer Mother 79  . Breast cancer Maternal Aunt   . Breast cancer Maternal Aunt 70  . Lung cancer Maternal Uncle 75  . Prostate cancer Maternal Uncle 65  . Uterine cancer Maternal Aunt 31    SOCIAL HISTORY:   Social History   Tobacco Use  . Smoking status: Never Smoker  . Smokeless tobacco: Never Used  Substance Use Topics  . Alcohol use: No    Alcohol/week: 0.0 standard drinks  . Drug use: No    ALLERGIES:  is allergic to levofloxacin, metformin, statins, taxol [paclitaxel], sulfa antibiotics, and tape.  MEDICATIONS:  Current Outpatient Medications  Medication Sig Dispense Refill  . Cholecalciferol (HM VITAMIN D3) 50 MCG (2000 UT) CAPS Take 1 capsule by mouth daily.    . colestipol (COLESTID) 1 g tablet Take 1 tablet by mouth daily.    . cycloSPORINE (RESTASIS) 0.05 % ophthalmic emulsion Place 1 drop into both eyes 2 (two) times daily.    Marland Kitchen escitalopram (LEXAPRO) 10 MG tablet Take 5-10 mg by mouth daily.    . fluticasone (FLONASE) 50 MCG/ACT nasal spray Place 2 sprays into both nostrils daily.    . hydrocortisone 2.5 % cream Apply twice daily as needed to rash at groin, below breasts and at abdomen up to 1 week. Always use together with antifungal cream. 28 g 2  . levothyroxine (SYNTHROID, LEVOTHROID) 50 MCG tablet Take 75 mcg by mouth daily before breakfast.     . pantoprazole (PROTONIX) 40 MG tablet Take 40 mg by mouth daily.    . potassium chloride (KLOR-CON 10) 10 MEQ tablet Take 40 mEq by mouth daily.     Marland Kitchen triamterene-hydrochlorothiazide (MAXZIDE-25) 37.5-25 MG per tablet Take 1 tablet by mouth daily.    Marland Kitchen venlafaxine XR (EFFEXOR-XR) 75 MG 24 hr capsule Take by mouth.     No current facility-administered medications for this visit.   Facility-Administered Medications Ordered in Other Visits  Medication Dose Route Frequency Provider Last Rate Last Admin  . heparin lock flush 100 unit/mL  500 Units Intravenous  Once Choksi, Janak, MD      . sodium chloride 0.9 % injection 10 mL  10 mL Intravenous PRN Forest Gleason, MD   10 mL at 11/21/14 1108  . sodium chloride 0.9 % injection 10 mL  10 mL Intravenous PRN Forest Gleason, MD   10 mL at 03/21/15 1044    PHYSICAL EXAMINATION: ECOG PERFORMANCE STATUS: 0 - Asymptomatic  BP (!) 144/81 (BP Location: Left Arm, Patient Position: Sitting, Cuff Size: Large)   Pulse 79   Temp 97.7 F (36.5 C) (Tympanic)   Resp 16   Ht '5\' 3"'$  (1.6 m)   Wt 173 lb (78.5 kg)   SpO2 97%   BMI 30.65 kg/m   Filed Weights   12/02/19 1335  Weight: 173 lb (78.5 kg)    Physical Exam HENT:     Head: Normocephalic and atraumatic.     Mouth/Throat:     Pharynx: No oropharyngeal exudate.  Eyes:     Pupils: Pupils are equal, round, and reactive to light.  Cardiovascular:  Rate and Rhythm: Normal rate and regular rhythm.  Pulmonary:     Effort: No respiratory distress.     Breath sounds: No wheezing.  Abdominal:     General: Bowel sounds are normal. There is no distension.     Palpations: Abdomen is soft. There is no mass.     Tenderness: There is no abdominal tenderness. There is no guarding or rebound.  Musculoskeletal:        General: No tenderness. Normal range of motion.     Cervical back: Normal range of motion and neck supple.  Skin:    General: Skin is warm.  Neurological:     Mental Status: She is alert and oriented to person, place, and time.  Psychiatric:        Mood and Affect: Affect normal.    LABORATORY DATA:  I have reviewed the data as listed    Component Value Date/Time   NA 139 12/02/2019 1315   NA 140 05/24/2014 1034   K 3.5 12/02/2019 1315   K 3.3 (L) 05/24/2014 1034   CL 101 12/02/2019 1315   CL 97 (L) 05/24/2014 1034   CO2 25 12/02/2019 1315   CO2 30 05/24/2014 1034   GLUCOSE 147 (H) 12/02/2019 1315   GLUCOSE 152 (H) 05/24/2014 1034   BUN 17 12/02/2019 1315   BUN 19 (H) 05/24/2014 1034   CREATININE 0.83 12/02/2019 1315    CREATININE 1.55 (H) 05/24/2014 1034   CALCIUM 9.1 12/02/2019 1315   CALCIUM 9.6 05/24/2014 1034   PROT 7.0 12/02/2019 1315   PROT 7.6 05/24/2014 1034   ALBUMIN 3.9 12/02/2019 1315   ALBUMIN 3.8 05/24/2014 1034   AST 22 12/02/2019 1315   AST 30 05/24/2014 1034   ALT 21 12/02/2019 1315   ALT 55 05/24/2014 1034   ALKPHOS 69 12/02/2019 1315   ALKPHOS 98 05/24/2014 1034   BILITOT 0.8 12/02/2019 1315   BILITOT 0.5 05/24/2014 1034   GFRNONAA >60 12/02/2019 1315   GFRNONAA 35 (L) 05/24/2014 1034   GFRNONAA 60 (L) 11/08/2013 1115   GFRAA >60 12/02/2019 1315   GFRAA 42 (L) 05/24/2014 1034   GFRAA >60 11/08/2013 1115    No results found for: SPEP, UPEP  Lab Results  Component Value Date   WBC 7.9 12/02/2019   NEUTROABS 4.9 12/02/2019   HGB 14.6 12/02/2019   HCT 41.9 12/02/2019   MCV 87.1 12/02/2019   PLT 176 12/02/2019      Chemistry      Component Value Date/Time   NA 139 12/02/2019 1315   NA 140 05/24/2014 1034   K 3.5 12/02/2019 1315   K 3.3 (L) 05/24/2014 1034   CL 101 12/02/2019 1315   CL 97 (L) 05/24/2014 1034   CO2 25 12/02/2019 1315   CO2 30 05/24/2014 1034   BUN 17 12/02/2019 1315   BUN 19 (H) 05/24/2014 1034   CREATININE 0.83 12/02/2019 1315   CREATININE 1.55 (H) 05/24/2014 1034      Component Value Date/Time   CALCIUM 9.1 12/02/2019 1315   CALCIUM 9.6 05/24/2014 1034   ALKPHOS 69 12/02/2019 1315   ALKPHOS 98 05/24/2014 1034   AST 22 12/02/2019 1315   AST 30 05/24/2014 1034   ALT 21 12/02/2019 1315   ALT 55 05/24/2014 1034   BILITOT 0.8 12/02/2019 1315   BILITOT 0.5 05/24/2014 1034       RADIOGRAPHIC STUDIES: I have personally reviewed the radiological images as listed and agreed with the findings in the  report. No results found.   ASSESSMENT & PLAN:  Ovarian cancer, right ([place]) # STAGE II right-sided Ovarian cancer status post TAH/BSO followed by adjuvant chemotherapy.  Clinically NED. STABLE;  Awaiting Ca125.   #Given the diagnosis-5 to 6  years ago I think patient is cured of her cancer.  I would recommend surveillance to gynecology oncology going forward.  We will follow up with [place] as needed.  #Baretts esophagus- [AUg 3790]; [facility]-recommend close follow-up with GI.  In agreement.  # DISPOSITION:  # follow up as needed- Dr.B   No orders of the defined types were placed in this encounter.  All questions were answered. The patient knows to call the clinic with any problems, questions or concerns.      Cammie Sickle, MD 12/02/2019 4:06 PM

## 2019-12-02 NOTE — Assessment & Plan Note (Addendum)
#   STAGE II right-sided Ovarian cancer status post TAH/BSO followed by adjuvant chemotherapy.  Clinically NED. STABLE;  Awaiting Ca125.   #Given the diagnosis-5 to 6 years ago I think patient is cured of her cancer.  I would recommend surveillance to gynecology oncology going forward.  We will follow up with [place] as needed.  #Baretts esophagus- [AUg 2258]; [facility]-recommend close follow-up with GI.  In agreement.  # DISPOSITION:  # follow up as needed- Dr.B

## 2019-12-02 NOTE — Patient Instructions (Signed)
#   Follow up with gynecology-oncology as planned.

## 2019-12-03 LAB — CA 125: Cancer Antigen (CA) 125: 12 U/mL (ref 0.0–38.1)

## 2020-04-03 ENCOUNTER — Other Ambulatory Visit: Payer: Self-pay

## 2020-04-03 ENCOUNTER — Ambulatory Visit (INDEPENDENT_AMBULATORY_CARE_PROVIDER_SITE_OTHER): Payer: Medicare Other | Admitting: Dermatology

## 2020-04-03 ENCOUNTER — Encounter: Payer: Self-pay | Admitting: Dermatology

## 2020-04-03 DIAGNOSIS — L578 Other skin changes due to chronic exposure to nonionizing radiation: Secondary | ICD-10-CM

## 2020-04-03 NOTE — Progress Notes (Signed)
   Follow-Up Visit   Subjective  Leonora Gores Champagne is a 79 y.o. female who presents for the following: Actinic Keratosis (Patient was seen last April for a FBSE and had some AK's treated with LN2 at left forehead. She feels that it is still there and may have a few other spots as well. ).  The following portions of the chart were reviewed this encounter and updated as appropriate:   Tobacco  Allergies  Meds  Problems  Med Hx  Surg Hx  Fam Hx      Review of Systems:  No other skin or systemic complaints except as noted in HPI or Assessment and Plan.  Objective  Well appearing patient in no apparent distress; mood and affect are within normal limits.  A focused examination was performed including face. Relevant physical exam findings are noted in the Assessment and Plan.     Assessment & Plan   Actinic Damage - Severe, chronic, secondary to cumulative UV radiation exposure over time - diffuse scaly erythematous macules and papules with underlying dyspigmentation - Discussed Prescription "Field Treatment" for Severe, Chronic Confluent Actinic Changes with Pre-Cancerous Actinic Keratoses Field treatment involves treatment of an entire area of skin that has confluent Actinic Changes (Sun/ Ultraviolet light damage) and PreCancerous Actinic Keratoses by method of PhotoDynamic Therapy (PDT) and/or prescription Topical Chemotherapy agents such as 5-fluorouracil, 5-fluorouracil/calcipotriene, and/or imiquimod.  The purpose is to decrease the number of clinically evident and subclinical PreCancerous lesions to prevent progression to development of skin cancer by chemically destroying early precancer changes that may or may not be visible.  It has been shown to reduce the risk of developing skin cancer in the treated area. As a result of treatment, redness, scaling, crusting, and open sores may occur during treatment course. One or more than one of these methods may be used and may have to be  used several times to control, suppress and eliminate the PreCancerous changes. Discussed treatment course, expected reaction, and possible side effects. - Recommend daily broad spectrum sunscreen SPF 30+ to sun-exposed areas, reapply every 2 hours as needed.  - Call for new or changing lesions. - Start 5-fluorouracil/calcipotriene cream twice a day for 4 days to affected areas including left forehead and nose. Prescription sent to [facility]. Patient advised they will receive an email to purchase the medication online and have it sent to their home. Patient provided with handout reviewing treatment course and side effects and advised to call or message [place] on MyChart with any concerns.    Return in about 4 months (around 08/02/2020) for TBSE, February for AK follow up .  Graciella Belton, RMA, am acting as scribe for Forest Gleason, MD .  Documentation: I have reviewed the above documentation for accuracy and completeness, and I agree with the above.  Forest Gleason, MD

## 2020-04-03 NOTE — Patient Instructions (Addendum)
Instructions for [facility]  One or more of your medications was sent to the [facility] mail order [facility]. You will receive an email from them and can purchase the medicine through that link. It will then be mailed to your home at the address you confirmed. If for any reason you do not receive an email from them, please check your spam folder. If you still do not find the email, please let us know. [facility] phone number is [phone].   5-Fluorouracil/Calcipotriene Patient Education   Actinic keratoses are the dry, red scaly spots on the skin caused by sun damage. A portion of these spots can turn into skin cancer with time, and treating them can help prevent development of skin cancer.   Treatment of these spots requires removal of the defective skin cells. There are various ways to remove actinic keratoses, including freezing with liquid nitrogen, treatment with creams, or treatment with a blue light procedure in the office.   5-fluorouracil cream is a topical cream used to treat actinic keratoses. It works by interfering with the growth of abnormal fast-growing skin cells, such as actinic keratoses. These cells peel off and are replaced by healthy ones.   5-fluorouracil/calcipotriene is a combination of the 5-fluorouracil cream with a vitamin D analog cream called calcipotriene. The calcipotriene alone does not treat actinic keratoses. However, when it is combined with 5-fluorouracil, it helps the 5-fluorouracil treat the actinic keratoses much faster so that the same results can be achieved with a much shorter treatment time.  INSTRUCTIONS FOR 5-FLUOROURACIL/CALCIPOTRIENE CREAM:   5-fluorouracil/calcipotriene cream typically only needs to be used for 4-7 days. A thin layer should be applied twice a day to the treatment areas (nose, left forehead) recommended by your physician.   If your physician prescribed you separate tubes of 5-fluourouracil and  calcipotriene, apply a thin layer of 5-fluorouracil followed by a thin layer of calcipotriene.   Avoid contact with your eyes, nostrils, and mouth. Do not use 5-fluorouracil/calcipotriene cream on infected or open wounds.   You will develop redness, irritation and some crusting at areas where you have pre-cancer damage/actinic keratoses. IF YOU DEVELOP PAIN, BLEEDING, OR SIGNIFICANT CRUSTING, STOP THE TREATMENT EARLY - you have already gotten a good response and the actinic keratoses should clear up well.  Wash your hands after applying 5-fluorouracil 5% cream on your skin.   A moisturizer or sunscreen with a minimum SPF 30 should be applied each morning.   Once you have finished the treatment, you can apply a thin layer of Vaseline twice a day to irritated areas to soothe and calm the areas more quickly. If you experience significant discomfort, contact your physician.  For some patients it is necessary to repeat the treatment for best results.  SIDE EFFECTS: When using 5-fluorouracil/calcipotriene cream, you may have mild irritation, such as redness, dryness, swelling, or a mild burning sensation. This usually resolves within 2 weeks. The more actinic keratoses you have, the more redness and inflammation you can expect during treatment. Eye irritation has been reported rarely. If this occurs, please let us know.  If you have any trouble using this cream, please call the office. If you have any other questions about this information, please do not hesitate to ask me before you leave the office.

## 2020-06-13 ENCOUNTER — Inpatient Hospital Stay (HOSPITAL_BASED_OUTPATIENT_CLINIC_OR_DEPARTMENT_OTHER): Payer: Medicare Other | Admitting: Obstetrics and Gynecology

## 2020-06-13 ENCOUNTER — Ambulatory Visit: Payer: Medicare Other | Admitting: Dermatology

## 2020-06-13 ENCOUNTER — Other Ambulatory Visit: Payer: Self-pay

## 2020-06-13 ENCOUNTER — Inpatient Hospital Stay: Payer: Medicare Other | Attending: Obstetrics and Gynecology

## 2020-06-13 VITALS — BP 133/79 | HR 75 | Temp 96.4°F | Resp 20 | Wt 173.2 lb

## 2020-06-13 DIAGNOSIS — Z8543 Personal history of malignant neoplasm of ovary: Secondary | ICD-10-CM

## 2020-06-13 DIAGNOSIS — C569 Malignant neoplasm of unspecified ovary: Secondary | ICD-10-CM

## 2020-06-13 DIAGNOSIS — Z87891 Personal history of nicotine dependence: Secondary | ICD-10-CM | POA: Diagnosis not present

## 2020-06-13 DIAGNOSIS — E559 Vitamin D deficiency, unspecified: Secondary | ICD-10-CM | POA: Diagnosis not present

## 2020-06-13 DIAGNOSIS — Z6835 Body mass index (BMI) 35.0-35.9, adult: Secondary | ICD-10-CM | POA: Diagnosis not present

## 2020-06-13 DIAGNOSIS — E669 Obesity, unspecified: Secondary | ICD-10-CM | POA: Diagnosis not present

## 2020-06-13 DIAGNOSIS — E785 Hyperlipidemia, unspecified: Secondary | ICD-10-CM | POA: Diagnosis not present

## 2020-06-13 DIAGNOSIS — Z9221 Personal history of antineoplastic chemotherapy: Secondary | ICD-10-CM | POA: Insufficient documentation

## 2020-06-13 DIAGNOSIS — Z9079 Acquired absence of other genital organ(s): Secondary | ICD-10-CM | POA: Diagnosis not present

## 2020-06-13 DIAGNOSIS — I10 Essential (primary) hypertension: Secondary | ICD-10-CM | POA: Insufficient documentation

## 2020-06-13 DIAGNOSIS — Z79899 Other long term (current) drug therapy: Secondary | ICD-10-CM | POA: Insufficient documentation

## 2020-06-13 DIAGNOSIS — K432 Incisional hernia without obstruction or gangrene: Secondary | ICD-10-CM | POA: Diagnosis not present

## 2020-06-13 DIAGNOSIS — E039 Hypothyroidism, unspecified: Secondary | ICD-10-CM | POA: Insufficient documentation

## 2020-06-13 DIAGNOSIS — E119 Type 2 diabetes mellitus without complications: Secondary | ICD-10-CM | POA: Insufficient documentation

## 2020-06-13 DIAGNOSIS — Z90722 Acquired absence of ovaries, bilateral: Secondary | ICD-10-CM | POA: Diagnosis not present

## 2020-06-13 DIAGNOSIS — K219 Gastro-esophageal reflux disease without esophagitis: Secondary | ICD-10-CM | POA: Diagnosis not present

## 2020-06-13 DIAGNOSIS — Z90710 Acquired absence of both cervix and uterus: Secondary | ICD-10-CM | POA: Insufficient documentation

## 2020-06-13 NOTE — Progress Notes (Signed)
Gynecologic Oncology Interval Visit   Primary Care Provider: Rusty Aus, MD [facility] [place], [place] [address] [phone]  Referring MD: Dr. Ferne Reus (no longer here). Rusty Aus, MD as PCP - General (Internal Medicine)  Cammie Sickle, MD - Oncology  Chief Concern: Ovarian Cancer surveillance  Subjective:  Leah Santos is a 80 y.o. female who returns to clinic today for continued ovarian cancer surveillance.  She was diagnosed with left ovarian cancer, adenocarcinoma, stage IIB s/p bilateral oophorectomy, omentectomy, lymph node dissection 12/21/12 followed by carbo-Taxol started on 01/2013.  She suffered an allergic reaction to Taxol with the second cycle and was switched to gemcitabine.  She completed a total of 6 cycles in February 2015. She has been NED since that time.   Today, she says that she has been diagnosed with diabetes and has lost 10 lbs intentionally with diet modification. 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 Overall she feels well and denies specific complaints. No changes in bowel movements, pain, discharge, or bleeding. 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 She does not have a regular gynecologist but continues to see Dr. Rogue Bussing with medical oncology.   CA 125  12/01/2018 11.5 8/21  12  Health care maintenance: Mammogram: 01/04/2019- birads category 1: negative. Last colonoscopy in 10/2013. Has not had bone density scan.    Gynecologic Oncology History:  [name] was diagnosed with left ovarian cancer, adenocarcinoma.pT2 pNO  M0, FIGOSTAGING II B. Status post bilateral oopherectomy, omentectomy, lymph node dissection on December 21, 2012.  CA 125 01/11/2013 87.1 (elevated)  CA-19-9 01/11/2013 62 (elevated)  CEA 01/11/2013 1.0 (normal)  -Started  on carboplatinum and Taxol from October, 2014. -Allergic reaction to Taxol on 2nd treatment.  Taxol was discontinued and started on gemcitabine -Patient has finished total 6 cycles of chemotherapy(initially with carboplatinum Taxol followed by carboplatinum and gemcitabine(February, 2015).  Pelvic MRI 11/21/2013  FINDINGS: Previous hysterectomy noted. Simple appearing cyst in the right adnexa shows significant decrease in size since previous study, currently measuring 1.2 x 2.5 cm on image 23 of series 9 compared to 4.5 x 5.9 cm previously. This is consistent with a resolving postoperative fluid collection such as a lymphocele or seroma. Another simple appearing cyst in the left adnexa measures 3.0 x 4.4 cm. This remains stable in size and appearance since prior exam, and is also suspicious for a postoperative fluid collection. No internal septations or solid mural nodules are visualized. IMPRESSION: 1. No acute findings or radiographic signs of pelvic metastatic disease. 2. Near complete resolution of right adnexal fluid collection, consistent with resolving postoperative lymphocele or seroma. 3. Stable simple appearing left adnexal cyst, most consistent with persistent postoperative lymphocele or seroma.  She was last seen by Dr. Rogue Bussing on 08/19/2017 with a negative exam and normal CA125.  She was complaining of abdominal distention and bloating at that time which prompted imaging.    08/26/2017- CT Chest/Abdomen/Pelvis W Contrast IMPRESSION: 1. Stable hepatic lesions are noted compared to prior exam. 2. Bilateral cystic adnexal abnormalities noted on prior exam are no longer present. 3. Possible fatty infiltration of the liver. 4. No acute abnormality is seen in the abdomen or pelvis. 5. Aortic Atherosclerosis (ICD10-I70.0).  CA 125 08/19/17  16.5 03/11/2017 18.3 08/20/2016  15.6 04/29/2018  < 0.6 12/01/2018  11.5 8/21  12  Genetic  testing: No BRCA mutation. Daughter has tested negative as  well.    Problem List: Patient Active Problem List   Diagnosis Date Noted  . Sepsis ([place]) 12/21/2017  . Ovarian cancer, right ([place]) 01/24/2016  . Ovarian cancer ([place]) 10/24/2015  . Avitaminosis D 02/21/2015  . Acid reflux 12/27/2014  . History of colon polyps 12/27/2014  . HLD (hyperlipidemia) 12/27/2014  . BP (high blood pressure) 12/27/2014  . Obstructive apnea 12/27/2014  . History of ovarian cancer 02/16/2014  . Acquired hypothyroidism 02/16/2014    Past Medical History: Past Medical History:  Diagnosis Date  . Anemia   . Asthma   . Barrett's esophagus   . Cellulitis    Right lower extremity  . Chronic acquired lymphedema    since childhood  . Diabetes mellitus without complication ([place])   . GERD (gastroesophageal reflux disease)   . History of cellulitis   . History of chemotherapy    finished total 6 cycles of chemotherapy(initially with carboplatinum Taxol followed by carboplatinum and gemcitabine(February, 2015)  . History of colon polyps   . History of colonoscopy with polypectomy 11/07/13  . History of genital warts 1986  . History of mammogram 04/29/2014  . HTN (hypertension)   . Hx of dysplastic nevus 07/21/2007   Mid back. Moderate atypia, changes extend focally to one edge.  . Hypothyroidism   . IBS (irritable bowel syndrome)   . Ovarian cancer on left (HCC)    No BRCA mutation-Left ovarian cancer, adenocarcinoma.pT2 pNO  M0     Past Surgical History: Past Surgical History:  Procedure Laterality Date  . BREAST BIOPSY Right 1994   neg  . BREAST EXCISIONAL BIOPSY Right 1962  . CHOLECYSTECTOMY  1996  . COLONOSCOPY W/ POLYPECTOMY  11/07/13  . COLONOSCOPY WITH PROPOFOL N/A 11/29/2018   Procedure: COLONOSCOPY WITH PROPOFOL;  Surgeon: Lollie Sails, MD;  Location: [facility] ENDOSCOPY;  Service: Endoscopy;  Laterality: N/A;  . ESOPHAGOGASTRODUODENOSCOPY (EGD) WITH PROPOFOL N/A 11/29/2018   Procedure: ESOPHAGOGASTRODUODENOSCOPY (EGD) WITH PROPOFOL;  Surgeon:  Lollie Sails, MD;  Location: [facility] ENDOSCOPY;  Service: Endoscopy;  Laterality: N/A;  . ESOPHAGOGASTRODUODENOSCOPY ENDOSCOPY  11/07/13  . Exp. Laparotomy, TAH, Salpingo-oophorectomy, Appendectomy  12/2012  . [place]  . TUBAL LIGATION      Past Gynecologic History:  As per HPI  OB History:  OB History  Gravida Para Term Preterm AB Living  _0 SAB IAB Ectopic Multiple Live Births               # Outcome Date GA Lbr Len/2nd Weight Sex Delivery Anes PTL Lv  3 Term           2 Term           1 Term             Obstetric Comments  Onset of menses at age 34    Family History: Family History  Problem Relation Age of Onset  . Ovarian cancer Mother 65  . Breast cancer Maternal Aunt   . Breast cancer Maternal Aunt 70  . Lung cancer Maternal Uncle 75  . Prostate cancer Maternal Uncle 65  . Uterine cancer Maternal Aunt 41    Social History: Social History   Socioeconomic History  . Marital status: Married    Spouse name: Not on file  . Number of children: Not on file  . Years of education: Not on file  . Highest  education level: Not on file  Occupational History  . Not on file  Tobacco Use  . Smoking status: Never Smoker  . Smokeless tobacco: Never Used  Substance and Sexual Activity  . Alcohol use: No    Alcohol/week: 0.0 standard drinks  . Drug use: No  . Sexual activity: Not on file  Other Topics Concern  . Not on file  Social History Narrative  . Not on file   Social Determinants of Health   Financial Resource Strain: Not on file  Food Insecurity: Not on file  Transportation Needs: Not on file  Physical Activity: Not on file  Stress: Not on file  Social Connections: Not on file  Intimate Partner Violence: Not on file    Allergies: Allergies  Allergen Reactions  . Levofloxacin Other (See Comments)    Sore throat,arms tingling,irregular heartbeat   . Metformin Other (See Comments)    Per patient , made her heart race and  " do funny things."  . Statins Other (See Comments)    Muscle cramps  . Taxol [Paclitaxel]     Allergic reaction to Taxol ON 2nd treatment.  Taxol was discontinued and started on gemcitabine   . Sulfa Antibiotics Rash  . Tape Rash    Skin irritation with prolonged use (bandages with latex)    Current Medications: Current Outpatient Medications  Medication Sig Dispense Refill  . Cholecalciferol (HM VITAMIN D3) 50 MCG (2000 UT) CAPS Take 1 capsule by mouth daily.    . colestipol (COLESTID) 1 g tablet Take 1 tablet by mouth daily.    . famotidine (PEPCID) 40 MG tablet Take by mouth.    . fluticasone (FLONASE) 50 MCG/ACT nasal spray Place 2 sprays into both nostrils daily.    . hydrocortisone 2.5 % cream Apply twice daily as needed to rash at groin, below breasts and at abdomen up to 1 week. Always use together with antifungal cream. 28 g 2  . levothyroxine (SYNTHROID) 75 MCG tablet Take by mouth.    . Melatonin 3 MG CAPS Take by mouth.    . pantoprazole (PROTONIX) 40 MG tablet Take 40 mg by mouth daily.    . potassium chloride (KLOR-CON) 10 MEQ tablet Take 40 mEq by mouth daily.     Marland Kitchen triamterene-hydrochlorothiazide (MAXZIDE-25) 37.5-25 MG per tablet Take 1 tablet by mouth daily.    Marland Kitchen venlafaxine XR (EFFEXOR-XR) 75 MG 24 hr capsule Take by mouth.    . colestipol (COLESTID) 1 g tablet Take by mouth. (Patient not taking: Reported on 06/13/2020)    . cycloSPORINE (RESTASIS) 0.05 % ophthalmic emulsion Place 1 drop into both eyes 2 (two) times daily. (Patient not taking: Reported on 06/13/2020)    . escitalopram (LEXAPRO) 10 MG tablet Take 5-10 mg by mouth daily.    Marland Kitchen levothyroxine (SYNTHROID, LEVOTHROID) 50 MCG tablet Take 75 mcg by mouth daily before breakfast.  (Patient not taking: Reported on 06/13/2020)    . venlafaxine XR (EFFEXOR-XR) 75 MG 24 hr capsule Take by mouth. (Patient not taking: Reported on 06/13/2020)     No current facility-administered medications for this visit.    Facility-Administered Medications Ordered in Other Visits  Medication Dose Route Frequency Provider Last Rate Last Admin  . heparin lock flush 100 unit/mL  500 Units Intravenous Once Choksi, Janak, MD      . sodium chloride 0.9 % injection 10 mL  10 mL Intravenous PRN Forest Gleason, MD   10 mL at 11/21/14 1108  . sodium chloride 0.9 %  injection 10 mL  10 mL Intravenous PRN Forest Gleason, MD   10 mL at 03/21/15 1044    Review of Systems General:  Fatigue improved; intentional weight loss Skin: no complaints Eyes: no complaints HEENT: no complaints Breasts: no complaints Pulmonary: no complaints Cardiac: no complaints Gastrointestinal: no complaints Genitourinary/Sexual: no complaints Ob/Gyn: no complaints Musculoskeletal: no complaints Hematology: no complaints Neurologic/Psych: no complaints   Objective:  Physical Examination:  BP 133/79   Pulse 75   Temp (!) 96.4 F (35.8 C)   Resp 20   Wt 173 lb 3.2 oz (78.6 kg)   SpO2 100%   BMI 30.68 kg/m    ECOG Performance Status: 0 - Asymptomatic  GENERAL: Patient is a well appearing female in no acute distress HEENT:  Sclera clear. Anicteric NODES:  Negative axillary, supraclavicular, inguinal lymph node survery LUNGS:  Clear to auscultation bilaterally.   HEART:  Regular rate and rhythm.  ABDOMEN:  Soft, nontender.  Incisions well healed. No masses or ascites. Hernia at umbilicus and extending superiorly and inferiorly at least 12 cm in length.  EXTREMITIES:  No peripheral edema. Atraumatic. No cyanosis SKIN:  Clear with no obvious rashes or skin changes.  NEURO:  Nonfocal. Well oriented.  Appropriate affect Pelvic: exam chaperoned by nurse;  Vulva: normal appearing vulva with no masses; Vagina: normal vagina; Adnexa: surgically absent; Uterus: surgically absent, vaginal cuff well healed; Cervix: absent; Bimanual/Rectal: normal  Lab Review Labs on site today: pending CA 125  Radiologic Imaging No imaging ordered  today    Assessment:  Leah Santos is a 80 y.o. female diagnosed with stage II left ovarian cancer, adenocarcinoma, status post bilateral oophorectomy, omentectomy, lymph node dissection on 12/21/2012 followed by carbo-Taxol started on 01/2013.  She suffered an allergic reaction to Taxol with the second cycle and was switched to gemcitabine.  She completed a total of 6 cycles in February 2015. NED since then with normal CA125  Clinically, no evidence of recurrent disease today.   Germline BRCA testing negative.   Body mass index is 30.68 kg/m. Incisional ventral hernia, asymptomatic.    Medical co-morbidities complicating care: HTN, obesity and prior abdominal surgery. Diabetes Mellitus Plan:   Problem List Items Addressed This Visit      Endocrine   Ovarian cancer ([place]) - Primary     CA 125 pending today. Was elevated at time of diagnosis so would recommend checking annually with her appointments as this is a good marker for her cancer. Since it has been seven years and she had early stage disease she can follow up with Dr Sabra Heck and have annual CA125.  Pelvic exams not needed for surveillance at this point. We can see her back in [facility] should the need arise.   Encouraged annual mammograms and bi-annual bone density scans with her PCP. She has not had a bone density scan. I encouraged weight bearing exercise, calcium and vitamin d supplementation.   I discussed the assessment and plan with the patient. The patient was provided an opportunity to ask questions and all were answered. The patient agreed with the plan and demonstrated an understanding of the instructions.  Beckey Rutter, DNP, AGNP-C [place] [facility] [phone] (clinic)  I personally interviewed and examined the patient. Agreed with the above/below plan of care. I have directly contributed to assessment and plan of care of this patient and educated and discussed with patient and family.   Mellody Drown, MD  CC:  Rusty Aus,  MD [facility] [place], [place] [address] [phone]

## 2020-06-14 LAB — CA 125: Cancer Antigen (CA) 125: 12.4 U/mL (ref 0.0–38.1)

## 2020-07-10 ENCOUNTER — Emergency Department: Payer: Medicare Other

## 2020-07-10 ENCOUNTER — Other Ambulatory Visit: Payer: Self-pay

## 2020-07-10 DIAGNOSIS — Z8543 Personal history of malignant neoplasm of ovary: Secondary | ICD-10-CM | POA: Insufficient documentation

## 2020-07-10 DIAGNOSIS — I10 Essential (primary) hypertension: Secondary | ICD-10-CM | POA: Insufficient documentation

## 2020-07-10 DIAGNOSIS — E039 Hypothyroidism, unspecified: Secondary | ICD-10-CM | POA: Diagnosis not present

## 2020-07-10 DIAGNOSIS — M7122 Synovial cyst of popliteal space [Baker], left knee: Secondary | ICD-10-CM | POA: Diagnosis not present

## 2020-07-10 DIAGNOSIS — J45909 Unspecified asthma, uncomplicated: Secondary | ICD-10-CM | POA: Insufficient documentation

## 2020-07-10 DIAGNOSIS — E119 Type 2 diabetes mellitus without complications: Secondary | ICD-10-CM | POA: Insufficient documentation

## 2020-07-10 DIAGNOSIS — Z79899 Other long term (current) drug therapy: Secondary | ICD-10-CM | POA: Diagnosis not present

## 2020-07-10 DIAGNOSIS — M79672 Pain in left foot: Secondary | ICD-10-CM

## 2020-07-10 DIAGNOSIS — L03116 Cellulitis of left lower limb: Secondary | ICD-10-CM | POA: Insufficient documentation

## 2020-07-10 LAB — COMPREHENSIVE METABOLIC PANEL
ALT: 19 U/L (ref 0–44)
AST: 22 U/L (ref 15–41)
Albumin: 3.7 g/dL (ref 3.5–5.0)
Alkaline Phosphatase: 76 U/L (ref 38–126)
Anion gap: 10 (ref 5–15)
BUN: 19 mg/dL (ref 8–23)
CO2: 25 mmol/L (ref 22–32)
Calcium: 9.4 mg/dL (ref 8.9–10.3)
Chloride: 100 mmol/L (ref 98–111)
Creatinine, Ser: 1.08 mg/dL — ABNORMAL HIGH (ref 0.44–1.00)
GFR, Estimated: 52 mL/min — ABNORMAL LOW (ref >60)
Glucose, Bld: 233 mg/dL — ABNORMAL HIGH (ref 70–99)
Potassium: 3.9 mmol/L (ref 3.5–5.1)
Sodium: 135 mmol/L (ref 135–145)
Total Bilirubin: 1.1 mg/dL (ref 0.3–1.2)
Total Protein: 7.2 g/dL (ref 6.5–8.1)

## 2020-07-10 LAB — CBC WITH DIFFERENTIAL/PLATELET
Abs Immature Granulocytes: 0.05 10*3/uL (ref 0.00–0.07)
Basophils Absolute: 0.1 10*3/uL (ref 0.0–0.1)
Basophils Relative: 0 %
Eosinophils Absolute: 0.2 10*3/uL (ref 0.0–0.5)
Eosinophils Relative: 2 %
HCT: 43 % (ref 36.0–46.0)
Hemoglobin: 14.4 g/dL (ref 12.0–15.0)
Immature Granulocytes: 0 %
Lymphocytes Relative: 13 %
Lymphs Abs: 1.7 10*3/uL (ref 0.7–4.0)
MCH: 29.9 pg (ref 26.0–34.0)
MCHC: 33.5 g/dL (ref 30.0–36.0)
MCV: 89.2 fL (ref 80.0–100.0)
Monocytes Absolute: 0.8 10*3/uL (ref 0.1–1.0)
Monocytes Relative: 6 %
Neutro Abs: 11 10*3/uL — ABNORMAL HIGH (ref 1.7–7.7)
Neutrophils Relative %: 79 %
Platelets: 196 10*3/uL (ref 150–400)
RBC: 4.82 MIL/uL (ref 3.87–5.11)
RDW: 13.3 % (ref 11.5–15.5)
WBC: 13.8 10*3/uL — ABNORMAL HIGH (ref 4.0–10.5)
nRBC: 0 % (ref 0.0–0.2)

## 2020-07-10 LAB — LACTIC ACID, PLASMA: Lactic Acid, Venous: 1.7 mmol/L (ref 0.5–1.9)

## 2020-07-10 NOTE — ED Triage Notes (Signed)
Pt presents to ER c/o left foot cellulitis.  Pt states she started noticing her left foot was hurting on Friday.  Pt was seen by PCP and started on abx, but pt states she is hurting today and was running a "fever of 99" along with chills.  Left foot is slightly red, but equally warm as right foot.  Pt has pain palpation to left lateral foot.  Foot edema noted BIL.

## 2020-07-11 ENCOUNTER — Emergency Department: Payer: Medicare Other

## 2020-07-11 ENCOUNTER — Emergency Department
Admission: EM | Admit: 2020-07-11 | Discharge: 2020-07-11 | Disposition: A | Discharge status: Home / Self Care | Payer: Medicare Other | Attending: Emergency Medicine | Admitting: Emergency Medicine

## 2020-07-11 DIAGNOSIS — M79605 Pain in left leg: Secondary | ICD-10-CM

## 2020-07-11 DIAGNOSIS — M7122 Synovial cyst of popliteal space [Baker], left knee: Secondary | ICD-10-CM

## 2020-07-11 DIAGNOSIS — M79672 Pain in left foot: Secondary | ICD-10-CM

## 2020-07-11 DIAGNOSIS — L03116 Cellulitis of left lower limb: Secondary | ICD-10-CM | POA: Diagnosis not present

## 2020-07-11 LAB — PROCALCITONIN: Procalcitonin: 0.18 ng/mL

## 2020-07-11 MED ORDER — SODIUM CHLORIDE 0.9 % IV SOLN
100.0000 mg | Freq: Once | INTRAVENOUS | Status: AC
  Administered 2020-07-11: 100 mg via INTRAVENOUS
  Filled 2020-07-11: qty 100

## 2020-07-11 MED ORDER — DOXYCYCLINE HYCLATE 50 MG PO CAPS: 100.0000 mg | ORAL_CAPSULE | Freq: Two times a day (BID) | ORAL | 0 refills | Status: DC

## 2020-07-11 NOTE — Discharge Instructions (Signed)
1.  Discontinue your current antibiotic.  Instead take Doxycycline 100 mg twice daily x7 days. 2.  Return to the ER for worsening symptoms, persistent vomiting, difficulty breathing or other concerns.

## 2020-07-11 NOTE — ED Provider Notes (Signed)
[facility] Emergency Department Provider Note   ____________________________________________   Event Date/Time   First MD Initiated Contact with Patient 07/11/20 [phone]     (approximate)  I have reviewed the triage vital signs and the nursing notes.   HISTORY  Chief Complaint Cellulitis    HPI Leah Santos is a 80 y.o. female who presents to the ED from home with a chief complaint of left foot cellulitis.  Patient being treated for cellulitis, on her second round of Keflex.  Also being treated for gout.  Reports her foot started hurting 5 days ago and she was placed on her second round of Keflex at that time.  Reports "fever of 99 F" along with chills.  Denies cough, chest pain, shortness of breath, abdominal pain, nausea, vomiting or dizziness.  Denies recent travel or trauma.     Past Medical History:  Diagnosis Date  . Anemia   . Asthma   . Barrett's esophagus   . Cellulitis    Right lower extremity  . Chronic acquired lymphedema    since childhood  . Diabetes mellitus without complication ([place])   . GERD (gastroesophageal reflux disease)   . History of cellulitis   . History of chemotherapy    finished total 6 cycles of chemotherapy(initially with carboplatinum Taxol followed by carboplatinum and gemcitabine(February, 2015)  . History of colon polyps   . History of colonoscopy with polypectomy 11/07/13  . History of genital warts 1986  . History of mammogram 04/29/2014  . HTN (hypertension)   . Hx of dysplastic nevus 07/21/2007   Mid back. Moderate atypia, changes extend focally to one edge.  . Hypothyroidism   . IBS (irritable bowel syndrome)   . 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 Sepsis ([place]) 12/21/2017  . Ovarian cancer, right ([place]) 01/24/2016  . Ovarian cancer ([place]) 10/24/2015  . Avitaminosis D 02/21/2015   . Acid reflux 12/27/2014  . History of colon polyps 12/27/2014  . HLD (hyperlipidemia) 12/27/2014  . BP (high blood pressure) 12/27/2014  . Obstructive apnea 12/27/2014  . History of ovarian cancer 02/16/2014  . Acquired hypothyroidism 02/16/2014    Past Surgical History:  Procedure Laterality Date  . BREAST BIOPSY Right 1994   neg  . BREAST EXCISIONAL BIOPSY Right 1962  . CHOLECYSTECTOMY  1996  . COLONOSCOPY W/ POLYPECTOMY  11/07/13  . COLONOSCOPY WITH PROPOFOL N/A 11/29/2018   Procedure: COLONOSCOPY WITH PROPOFOL;  Surgeon: Lollie Sails, MD;  Location[facility] ENDOSCOPY;  Service: Endoscopy;  Laterality: N/A;  . ESOPHAGOGASTRODUODENOSCOPY (EGD) WITH PROPOFOL N/A 11/29/2018   Procedure: ESOPHAGOGASTRODUODENOSCOPY (EGD) WITH PROPOFOL;  Surgeon: Lollie Sails, MD;  Location: [facility] ENDOSCOPY;  Service: Endoscopy;  Laterality: N/A;  . ESOPHAGOGASTRODUODENOSCOPY ENDOSCOPY  11/07/13  . Exp. Laparotomy, TAH, Salpingo-oophorectomy, Appendectomy  12/2012  . [place]  . TUBAL LIGATION      Prior to Admission medications   Medication Sig Start Date End Date Taking? Authorizing Provider  doxycycline (VIBRAMYCIN) 50 MG capsule Take 2 capsules (100 mg total) by mouth 2 (two) times daily. 07/11/20  Yes Paulette Blanch, MD  Cholecalciferol (HM VITAMIN D3) 50 MCG (2000 UT) CAPS Take 1 capsule by mouth daily.    [provider]  colestipol (COLESTID) 1 g tablet Take 1 tablet by mouth daily. 06/15/19   [provider]  colestipol (COLESTID) 1 g tablet Take by mouth. Patient not taking: Reported on 06/13/2020 02/15/20   [provider]  cycloSPORINE (RESTASIS) 0.05 % ophthalmic emulsion Place 1 drop into both eyes 2 (two) times daily. Patient not taking: Reported on 06/13/2020    [provider]  escitalopram (LEXAPRO) 10 MG tablet Take 5-10 mg by mouth daily.    [provider]  famotidine (PEPCID) 40 MG tablet Take by mouth. 01/11/20    [provider]  fluticasone (FLONASE) 50 MCG/ACT nasal spray Place 2 sprays into both nostrils daily.    [provider]  hydrocortisone 2.5 % cream Apply twice daily as needed to rash at groin, below breasts and at abdomen up to 1 week. Always use together with antifungal cream. 07/27/19   Moye, [place], MD  levothyroxine (SYNTHROID) 75 MCG tablet Take by mouth. 02/15/20   [provider]  levothyroxine (SYNTHROID, LEVOTHROID) 50 MCG tablet Take 75 mcg by mouth daily before breakfast.  Patient not taking: Reported on 06/13/2020    [provider]  Melatonin 3 MG CAPS Take by mouth. 01/11/20   [provider]  pantoprazole (PROTONIX) 40 MG tablet Take 40 mg by mouth daily.    [provider]  potassium chloride (KLOR-CON) 10 MEQ tablet Take 40 mEq by mouth daily.  03/23/14   [provider]  triamterene-hydrochlorothiazide (MAXZIDE-25) 37.5-25 MG per tablet Take 1 tablet by mouth daily. 03/23/14   [provider]  venlafaxine XR (EFFEXOR-XR) 75 MG 24 hr capsule Take by mouth. 08/23/19 08/22/20  [provider]  venlafaxine XR (EFFEXOR-XR) 75 MG 24 hr capsule Take by mouth. Patient not taking: Reported on 06/13/2020 02/15/20 02/14/21  [provider]    Allergies Levofloxacin, Metformin, Statins, Taxol [paclitaxel], Sulfa antibiotics, and Tape  Family History  Problem Relation Age of Onset  . Ovarian cancer Mother 71  . Breast cancer Maternal Aunt   . Breast cancer Maternal Aunt 70  . Lung cancer Maternal Uncle 75  . Prostate cancer Maternal Uncle 65  . Uterine cancer Maternal Aunt 41    Social History Social History   Tobacco Use  . Smoking status: Never Smoker  . Smokeless tobacco: Never Used  Substance Use Topics  . Alcohol use: No    Alcohol/week: 0.0 standard drinks  . Drug use: No    Review of Systems  Constitutional: No fever/chills Eyes: No visual changes. ENT: No sore  throat. Cardiovascular: Denies chest pain. Respiratory: Denies shortness of breath. Gastrointestinal: No abdominal pain.  No nausea, no vomiting.  No diarrhea.  No constipation. Genitourinary: Negative for dysuria. Musculoskeletal: Positive for left foot pain, swelling and redness.  Negative for back pain. Skin: Negative for rash. Neurological: Negative for headaches, focal weakness or numbness.   ____________________________________________   PHYSICAL EXAM:  VITAL SIGNS: ED Triage Vitals  Enc Vitals Group     BP 07/10/20 2144 (!) 146/86     Pulse Rate 07/10/20 2144 99     Resp 07/10/20 2144 17     Temp 07/10/20 2144 99.1 F (37.3 C)     Temp Source 07/10/20 2144 Oral     SpO2 07/10/20 2144 94 %     Weight 07/10/20 2145 170 lb (77.1 kg)     Height 07/10/20 2145 $RemoveBefor'5\' 3"'dbALuHMnMmTT$  (1.6 m)     Head Circumference --      Peak Flow --      Pain Score 07/10/20 2144 10     Pain Loc --  Pain Edu? --      Excl. in [place]? --     Constitutional: Alert and oriented.  Elderly appearing and in no acute distress. Eyes: Conjunctivae are normal. PERRL. EOMI. Head: Atraumatic. Nose: No congestion/rhinnorhea. Mouth/Throat: Mucous membranes are moist.  Oropharynx non-erythematous. Neck: No stridor.   Cardiovascular: Normal rate, regular rhythm. Grossly normal heart sounds.  Good peripheral circulation. Respiratory: Normal respiratory effort.  No retractions. Lungs CTAB. Gastrointestinal: Soft and nontender. No distention. No abdominal bruits. No CVA tenderness. Musculoskeletal:  LLE: redness and swelling to left lateral foot.  Swelling to left leg up to the knee.  2+ distal pulses.  Brisk, less than 5-second capillary refill. Neurologic:  Normal speech and language. No gross focal neurologic deficits are appreciated.  Skin:  Skin is warm, dry and intact. No rash noted. Psychiatric: Mood and affect are normal. Speech and behavior are normal.  ____________________________________________    LABS (all labs ordered are listed, but only abnormal results are displayed)  Labs Reviewed  CBC WITH DIFFERENTIAL/PLATELET - Abnormal; Notable for the following components:      Result Value   WBC 13.8 (*)    Neutro Abs 11.0 (*)    All other components within normal limits  COMPREHENSIVE METABOLIC PANEL - Abnormal; Notable for the following components:   Glucose, Bld 233 (*)    Creatinine, Ser 1.08 (*)    GFR, Estimated 52 (*)    All other components within normal limits  CULTURE, BLOOD (ROUTINE X 2)  CULTURE, BLOOD (ROUTINE X 2)  LACTIC ACID, PLASMA  PROCALCITONIN   ____________________________________________  EKG  None ____________________________________________  RADIOLOGY I, Lucca Greggs J, personally viewed and evaluated these images (plain radiographs) as part of my medical decision making, as well as reviewing the written report by the radiologist.  ED MD interpretation: No osteomyelitis; no DVT, Baker's cyst  Official radiology report(s): US Venous Img Lower Unilateral Left (DVT)  Result Date: 07/11/2020 CLINICAL DATA:  Left lower leg pain for 5 days EXAM: LEFT LOWER EXTREMITY VENOUS DOPPLER ULTRASOUND TECHNIQUE: Gray-scale sonography with compression, as well as color and duplex ultrasound, were performed to evaluate the deep venous system(s) from the level of the common femoral vein through the popliteal and proximal calf veins. COMPARISON:  None. FINDINGS: VENOUS Normal compressibility of the common femoral, superficial femoral, and popliteal veins, as well as the visualized calf veins. Visualized portions of profunda femoral vein and great saphenous vein unremarkable. No filling defects to suggest DVT on grayscale or color Doppler imaging. Doppler waveforms show normal direction of venous flow, normal respiratory plasticity and response to augmentation. Limited views of the contralateral common femoral vein are unremarkable. OTHER Roughly C-shaped, lobulated fluid  collection in the left popliteal fossa measuring up to 2.5 x 5.2 x 2 cm. The deep portion of the collection points anteriorly towards the joint space. No adjacent stranding or increased color Doppler flow. IMPRESSION: 1. Negative for DVT in the left lower extremity. 2. Bakers cyst in the left popliteal fossa. Electronically Signed   By: Monte Fantasia M.D.   On: 07/11/2020 04:40   DG Foot Complete Left  Result Date: 07/10/2020 CLINICAL DATA:  Foot cellulitis EXAM: LEFT FOOT - COMPLETE 3+ VIEW COMPARISON:  05/06/2015 FINDINGS: Old healed 5th metatarsal fracture. Degenerative changes and hallux valgus deformity at the 1st MTP. Soft tissue swelling along the dorsum of the foot. No acute fracture, subluxation or dislocation. No evidence of osteomyelitis. IMPRESSION: No acute bony abnormality. Electronically Signed   By: Lennette Bihari  [place] M.D.   On: 07/10/2020 22:06    ____________________________________________   PROCEDURES  Procedure(s) performed (including Critical Care):  Procedures   ____________________________________________   INITIAL IMPRESSION / ASSESSMENT AND PLAN / ED COURSE  As part of my medical decision making, I reviewed the following data within the [facility] notes reviewed and incorporated, Labs reviewed, Old chart reviewed, Radiograph reviewed and Notes from prior ED visits     80 year old female presenting for evaluation of left foot cellulitis, currently on Keflex.  Differential diagnosis includes but is not limited to cellulitis failed outpatient antibiotic treatment, sepsis, osteomyelitis, DVT, etc.  Laboratory results demonstrate mild leukocytosis, normal lactic acid.  Will obtain blood cultures, procalcitonin, ultrasound.  Change antibiotic to doxycycline.  Will reassess.  Clinical Course as of 07/11/20 0452  Wed Jul 11, 2020  0449 Updated patient on ultrasound result.  Although procalcitonin and lactic acid are unremarkable, will keep patient  on doxycycline as she states redness had improved with first course of Keflex but immediately returned after it was discontinued.  She will follow up closely with her PCP next week. Strict return precautions given. Patient verbalizes understanding and agrees with plan of care. [JS]    Clinical Course User Index [JS] Paulette Blanch, MD     ____________________________________________   FINAL CLINICAL IMPRESSION(S) / ED DIAGNOSES  Final diagnoses:  Cellulitis of left lower extremity  Synovial cyst of left popliteal space     ED Discharge Orders         Ordered    doxycycline (VIBRAMYCIN) 50 MG capsule  2 times daily        07/11/20 0451          *Please note:  Nalaysia Manganiello was evaluated in Emergency Department on 07/11/2020 for the symptoms described in the history of present illness. She was evaluated in the context of the global COVID-19 pandemic, which necessitated consideration that the patient might be at risk for infection with the SARS-CoV-2 virus that causes COVID-19. Institutional protocols and algorithms that pertain to the evaluation of patients at risk for COVID-19 are in a state of rapid change based on information released by regulatory bodies including the CDC and federal and state organizations. These policies and algorithms were followed during the patient's care in the ED.  Some ED evaluations and interventions may be delayed as a result of limited staffing during and the pandemic.*   Note:  This document was prepared using Dragon voice recognition software and may include unintentional dictation errors.   Paulette Blanch, MD 07/11/20 [phone]

## 2020-07-16 LAB — CULTURE, BLOOD (ROUTINE X 2)
Culture: NO GROWTH
Culture: NO GROWTH
Special Requests: ADEQUATE
Special Requests: ADEQUATE

## 2020-07-19 ENCOUNTER — Encounter: Payer: Self-pay | Admitting: Dermatology

## 2020-07-19 ENCOUNTER — Ambulatory Visit (INDEPENDENT_AMBULATORY_CARE_PROVIDER_SITE_OTHER): Payer: Medicare Other | Admitting: Dermatology

## 2020-07-19 ENCOUNTER — Other Ambulatory Visit: Payer: Self-pay

## 2020-07-19 DIAGNOSIS — L821 Other seborrheic keratosis: Secondary | ICD-10-CM

## 2020-07-19 DIAGNOSIS — L578 Other skin changes due to chronic exposure to nonionizing radiation: Secondary | ICD-10-CM

## 2020-07-19 NOTE — Patient Instructions (Signed)
Recommend daily broad spectrum sunscreen SPF 30+ to sun-exposed areas, reapply every 2 hours as needed. Call for new or changing lesions.  Staying in the shade or wearing long sleeves, sun glasses (UVA+UVB protection) and wide brim hats (4-inch brim around the entire circumference of the hat) are also recommended for sun protection.   If you have any questions or concerns for your doctor, please call our main line at [phone] and press option 4 to reach your doctor's medical assistant. If no one answers, please leave a voicemail as directed and we will return your call as soon as possible. Messages left after 4 pm will be answered the following business day.   You may also send us a message via MyChart. We typically respond to MyChart messages within 1-2 business days.  For prescription refills, please ask your pharmacy to contact our office. Our fax number is [phone].  If you have an urgent issue when the clinic is closed that cannot wait until the next business day, you can page your doctor at the number below.    Please note that while we do our best to be available for urgent issues outside of office hours, we are not available 24/7.   If you have an urgent issue and are unable to reach us, you may choose to seek medical care at your doctor's office, retail clinic, urgent care center, or emergency room.  If you have a medical emergency, please immediately call 911 or go to the emergency department.  Pager Numbers  - Dr. Kowalski: [phone]  - Dr. Moye: [phone]  - Dr. Stewart: [phone]  In the event of inclement weather, please call our main line at [phone] for an update on the status of any delays or closures.  Dermatology Medication Tips: Please keep the boxes that topical medications come in in order to help keep track of the instructions about where and how to use these. Pharmacies typically print the medication instructions only on the boxes and not  directly on the medication tubes.   If your medication is too expensive, please contact our office at [phone] option 4 or send us a message through MyChart.   We are unable to tell what your co-pay for medications will be in advance as this is different depending on your insurance coverage. However, we may be able to find a substitute medication at lower cost or fill out paperwork to get insurance to cover a needed medication.   If a prior authorization is required to get your medication covered by your insurance company, please allow us 1-2 business days to complete this process.  Drug prices often vary depending on where the prescription is filled and some pharmacies may offer cheaper prices.  The website www.goodrx.com contains coupons for medications through different pharmacies. The prices here do not account for what the cost may be with help from insurance (it may be cheaper with your insurance), but the website can give you the price if you did not use any insurance.  - You can print the associated coupon and take it with your prescription to the pharmacy.  - You may also stop by our office during regular business hours and pick up a GoodRx coupon card.  - If you need your prescription sent electronically to a different pharmacy, notify our office through [facility] MyChart or by phone at [phone] option 4.  

## 2020-07-19 NOTE — Progress Notes (Signed)
   Follow-Up Visit   Subjective  Leah Santos is a 80 y.o. female who presents for the following: Follow-up (Patient here today for follow up on actinic damage to face. She was prescribed fluorouracil / calcipotriene cream and was not sure if she was using correctly so stopped using after 8 days.  She states made her nose very red and crusty after using 8 days on forehead she did not see any changes. ).  The following portions of the chart were reviewed this encounter and updated as appropriate:  Tobacco  Allergies  Meds  Problems  Med Hx  Surg Hx  Fam Hx      Objective  Well appearing patient in no apparent distress; mood and affect are within normal limits.  A focused examination was performed including face. Relevant physical exam findings are noted in the Assessment and Plan.   Assessment & Plan   Actinic Damage - s/p 5FU/calcipotriene with good response, currently well-controlled - chronic, secondary to cumulative UV radiation exposure/sun exposure over time - diffuse scaly erythematous macules with underlying dyspigmentation - Recommend daily broad spectrum sunscreen SPF 30+ to sun-exposed areas, reapply every 2 hours as needed.  - Recommend staying in the shade or wearing long sleeves, sun glasses (UVA+UVB protection) and wide brim hats (4-inch brim around the entire circumference of the hat). - Call for new or changing lesions.  Seborrheic Keratoses - Stuck-on, waxy, tan-brown papules and/or plaques  - Benign-appearing - Discussed benign etiology and prognosis. - Observe - Call for any changes  Return in about 2 months (around 09/18/2020) for follow up as scheduled .  I, Ruthell Rummage, CMA, am acting as scribe for Forest Gleason, MD.  Documentation: I have reviewed the above documentation for accuracy and completeness, and I agree with the above.  Forest Gleason, MD

## 2020-08-02 ENCOUNTER — Encounter: Payer: Medicare Other | Admitting: Dermatology

## 2020-11-28 ENCOUNTER — Other Ambulatory Visit: Payer: Self-pay

## 2020-11-28 ENCOUNTER — Encounter: Payer: Self-pay | Admitting: Dermatology

## 2020-11-28 ENCOUNTER — Ambulatory Visit (INDEPENDENT_AMBULATORY_CARE_PROVIDER_SITE_OTHER): Payer: Medicare Other | Admitting: Dermatology

## 2020-11-28 DIAGNOSIS — L821 Other seborrheic keratosis: Secondary | ICD-10-CM

## 2020-11-28 DIAGNOSIS — L659 Nonscarring hair loss, unspecified: Secondary | ICD-10-CM

## 2020-11-28 DIAGNOSIS — L304 Erythema intertrigo: Secondary | ICD-10-CM

## 2020-11-28 DIAGNOSIS — D1800 Hemangioma unspecified site: Secondary | ICD-10-CM

## 2020-11-28 DIAGNOSIS — Z1283 Encounter for screening for malignant neoplasm of skin: Secondary | ICD-10-CM | POA: Diagnosis not present

## 2020-11-28 DIAGNOSIS — D229 Melanocytic nevi, unspecified: Secondary | ICD-10-CM

## 2020-11-28 DIAGNOSIS — L578 Other skin changes due to chronic exposure to nonionizing radiation: Secondary | ICD-10-CM | POA: Diagnosis not present

## 2020-11-28 DIAGNOSIS — L814 Other melanin hyperpigmentation: Secondary | ICD-10-CM

## 2020-11-28 NOTE — Progress Notes (Signed)
Follow-Up Visit   Subjective  Leah Santos is a 80 y.o. female who presents for the following: Annual Exam (Patient here today for tbse. She reports itchy rash at right abdomen she is continueing to use ketoconazole cream at affected area. Patient also has history aks at face. She has used calcipotriene and fluorouracil cream to use. ).  Patient here for full body skin exam and skin cancer screening.  Call if not better in a week can prescribe hydrocortisone  Zesorb af powder to apply at folds after shower   The following portions of the chart were reviewed this encounter and updated as appropriate:  Tobacco  Allergies  Meds  Problems  Med Hx  Surg Hx  Fam Hx       Objective  Well appearing patient in no apparent distress; mood and affect are within normal limits.  A full examination was performed including scalp, head, eyes, ears, nose, lips, neck, chest, axillae, abdomen, back, buttocks, bilateral upper extremities, bilateral lower extremities, hands, feet, fingers, toes, fingernails, and toenails. All findings within normal limits unless otherwise noted below.  Right Flank Erythematous patches  Scalp Diffuse thinning of the crown and widening of the midline part with retention of the frontal hairline  Assessment & Plan  Erythema intertrigo Right Flank  Intertrigo is a chronic recurrent rash that occurs in skin fold areas that may be associated with friction; heat; moisture; yeast; fungus; and bacteria.  It is exacerbated by increased movement / activity; sweating; and higher atmospheric temperature.  Continue using ketoconazole cream increasing to BID.  Recommend drying with hair dryer and applying zeasorb AF to help keep area dry after shower to prevent recurrence  Patient instructed to call if not clear in week or more. Will refill hydrocortisone 2.5 % cream to use twice a day as needed up to 1 week if needed.   Related Medications hydrocortisone 2.5 %  cream Apply twice daily as needed to rash at groin, below breasts and at abdomen up to 1 week. Always use together with antifungal cream.  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 This should ideally be used twice daily for best results but it helps with hair regrowth when used at least three times per week. Rogaine initially can cause increased hair shedding for the first few weeks but this will stop with continued use. In studies, people who used minoxidil (Rogaine) for at least 6 months had thicker hair than people who did not. Minoxidil topical (Rogaine) only works as long as it continues to be used. If if it is no longer used then the hair it has been helping to regrow can fall out. Minoxidil topical (Rogaine) can cause increased facial hair growth which can usually be managed easily with a battery-operated hair trimmer. If facial hair growth is bothersome, switching to the 2% women's version can decrease the risk of unwanted facial hair growth.  Chronic condition related to genetics and hormonal changes associated with menopause causing hair thinning primarily on the crown with widening of the part and temporal hairline recession.  Can try OTC Rogaine (minoxidil) 5% solution/foam as directed. Instructions given.   Lentigines - Scattered tan macules - Due to sun exposure - Benign-appering, observe - Recommend daily broad spectrum sunscreen SPF 30+ to sun-exposed areas, reapply every 2 hours as needed. - Call for any changes  Seborrheic Keratoses -  Stuck-on, waxy, tan-brown papules and/or plaques  - Benign-appearing - Discussed benign etiology and prognosis. - Observe - Call for any changes  Melanocytic Nevi - Tan-brown and/or pink-flesh-colored symmetric macules and papules - Benign appearing on exam today - Observation - Call clinic for new or changing moles -  Recommend daily use of broad spectrum spf 30+ sunscreen to sun-exposed areas.   Hemangiomas - Red papules - Discussed benign nature - Observe - Call for any changes  Actinic Damage - Chronic condition, secondary to cumulative UV/sun exposure - diffuse scaly erythematous macules with underlying dyspigmentation - Recommend daily broad spectrum sunscreen SPF 30+ to sun-exposed areas, reapply every 2 hours as needed.  - Staying in the shade or wearing long sleeves, sun glasses (UVA+UVB protection) and wide brim hats (4-inch brim around the entire circumference of the hat) are also recommended for sun protection.  - Call for new or changing lesions.  Skin cancer screening performed today.  Return in about 1 year (around 11/28/2021) for tbse . I, Ruthell Rummage, CMA, am acting as scribe for Forest Gleason, MD.  Documentation: I have reviewed the above documentation for accuracy and completeness, and I agree with the above.  Forest Gleason, MD

## 2020-11-28 NOTE — Patient Instructions (Addendum)
Zeasorb Af powder - use after shower at folds to help with moisture  Recommend minoxidil 5% (Rogaine for men) solution or foam to be applied to the scalp and left in. This should ideally be used twice daily for best results but it helps with hair regrowth when used at least three times per week. Rogaine initially can cause increased hair shedding for the first few weeks but this will stop with continued use. In studies, people who used minoxidil (Rogaine) for at least 6 months had thicker hair than people who did not. Minoxidil topical (Rogaine) only works as long as it continues to be used. If if it is no longer used then the hair it has been helping to regrow can fall out. Minoxidil topical (Rogaine) can cause increased facial hair growth which can usually be managed easily with a battery-operated hair trimmer. If facial hair growth is bothersome, switching to the 2% women's version can decrease the risk of unwanted facial hair growth.        Melanoma ABCDEs  Melanoma is the most dangerous type of skin cancer, and is the leading cause of death from skin disease.  You are more likely to develop melanoma if you: Have light-colored skin, light-colored eyes, or red or blond hair Spend a lot of time in the sun Tan regularly, either outdoors or in a tanning bed Have had blistering sunburns, especially during childhood Have a close family member who has had a melanoma Have atypical moles or large birthmarks  Early detection of melanoma is key since treatment is typically straightforward and cure rates are extremely high if we catch it early.   The first sign of melanoma is often a change in a mole or a new dark spot.  The ABCDE system is a way of remembering the signs of melanoma.  A for asymmetry:  The two halves do not match. B for border:  The edges of the growth are irregular. C for color:  A mixture of colors are present instead of an even brown color. D for diameter:  Melanomas are  usually (but not always) greater than 79m - the size of a pencil eraser. E for evolution:  The spot keeps changing in size, shape, and color.  Please check your skin once per month between visits. You can use a small mirror in front and a large mirror behind you to keep an eye on the back side or your body.   If you see any new or changing lesions before your next follow-up, please call to schedule a visit.  Please continue daily skin protection including broad spectrum sunscreen SPF 30+ to sun-exposed areas, reapplying every 2 hours as needed when you're outdoors.   Staying in the shade or wearing long sleeves, sun glasses (UVA+UVB protection) and wide brim hats (4-inch brim around the entire circumference of the hat) are also recommended for sun protection.    Recommend taking Heliocare sun protection supplement daily in sunny weather for additional sun protection. For maximum protection on the sunniest days, you can take up to 2 capsules of regular Heliocare OR take 1 capsule of Heliocare Ultra. For prolonged exposure (such as a full day in the sun), you can repeat your dose of the supplement 4 hours after your first dose. Heliocare can be purchased at [facility] at [email or web address]

## 2020-12-04 ENCOUNTER — Encounter: Payer: Self-pay | Admitting: Ophthalmology

## 2020-12-06 NOTE — Discharge Instructions (Signed)

## 2020-12-11 ENCOUNTER — Ambulatory Visit
Admission: RE | Admit: 2020-12-11 | Discharge: 2020-12-11 | Disposition: A | Discharge status: Home / Self Care | Payer: Medicare Other | Attending: Ophthalmology | Admitting: Ophthalmology

## 2020-12-11 ENCOUNTER — Ambulatory Visit: Payer: Medicare Other | Admitting: Anesthesiology

## 2020-12-11 ENCOUNTER — Encounter
Admission: RE | Disposition: A | Discharge status: Home / Self Care | Payer: Self-pay | Source: Home / Self Care | Attending: Ophthalmology

## 2020-12-11 ENCOUNTER — Encounter: Payer: Self-pay | Admitting: Ophthalmology

## 2020-12-11 ENCOUNTER — Other Ambulatory Visit: Payer: Self-pay

## 2020-12-11 DIAGNOSIS — Z803 Family history of malignant neoplasm of breast: Secondary | ICD-10-CM | POA: Insufficient documentation

## 2020-12-11 DIAGNOSIS — Z8049 Family history of malignant neoplasm of other genital organs: Secondary | ICD-10-CM | POA: Insufficient documentation

## 2020-12-11 DIAGNOSIS — Z79899 Other long term (current) drug therapy: Secondary | ICD-10-CM | POA: Insufficient documentation

## 2020-12-11 DIAGNOSIS — H2512 Age-related nuclear cataract, left eye: Secondary | ICD-10-CM | POA: Insufficient documentation

## 2020-12-11 DIAGNOSIS — Z8042 Family history of malignant neoplasm of prostate: Secondary | ICD-10-CM | POA: Insufficient documentation

## 2020-12-11 DIAGNOSIS — Z882 Allergy status to sulfonamides status: Secondary | ICD-10-CM | POA: Insufficient documentation

## 2020-12-11 DIAGNOSIS — Z91048 Other nonmedicinal substance allergy status: Secondary | ICD-10-CM | POA: Diagnosis not present

## 2020-12-11 DIAGNOSIS — Z801 Family history of malignant neoplasm of trachea, bronchus and lung: Secondary | ICD-10-CM | POA: Insufficient documentation

## 2020-12-11 DIAGNOSIS — Z8041 Family history of malignant neoplasm of ovary: Secondary | ICD-10-CM | POA: Insufficient documentation

## 2020-12-11 DIAGNOSIS — E1136 Type 2 diabetes mellitus with diabetic cataract: Secondary | ICD-10-CM | POA: Diagnosis not present

## 2020-12-11 DIAGNOSIS — Z79890 Hormone replacement therapy (postmenopausal): Secondary | ICD-10-CM | POA: Diagnosis not present

## 2020-12-11 DIAGNOSIS — Z888 Allergy status to other drugs, medicaments and biological substances status: Secondary | ICD-10-CM | POA: Diagnosis not present

## 2020-12-11 HISTORY — DX: Polyneuropathy, unspecified: G62.9

## 2020-12-11 HISTORY — PX: CATARACT EXTRACTION W/PHACO: SHX586

## 2020-12-11 SURGERY — PHACOEMULSIFICATION, CATARACT, WITH IOL INSERTION
Anesthesia: Monitor Anesthesia Care | Site: Eye | Laterality: Left

## 2020-12-11 MED ORDER — MIDAZOLAM HCL 2 MG/2ML IJ SOLN
INTRAMUSCULAR | Status: DC | PRN
  Administered 2020-12-11: 1 mg via INTRAVENOUS

## 2020-12-11 MED ORDER — FENTANYL CITRATE (PF) 100 MCG/2ML IJ SOLN
INTRAMUSCULAR | Status: DC | PRN
  Administered 2020-12-11: 50 ug via INTRAVENOUS

## 2020-12-11 MED ORDER — TETRACAINE HCL 0.5 % OP SOLN
1.0000 [drp] | OPHTHALMIC | Status: DC | PRN
  Administered 2020-12-11 (×3): 1 [drp] via OPHTHALMIC

## 2020-12-11 MED ORDER — SIGHTPATH DOSE#1 BSS IO SOLN
INTRAOCULAR | Status: DC | PRN
  Administered 2020-12-11: 15 mL via INTRAOCULAR

## 2020-12-11 MED ORDER — BRIMONIDINE TARTRATE-TIMOLOL 0.2-0.5 % OP SOLN
OPHTHALMIC | Status: DC | PRN
  Administered 2020-12-11: 1 [drp] via OPHTHALMIC

## 2020-12-11 MED ORDER — ACETAMINOPHEN 160 MG/5ML PO SOLN: 325.0000 mg | ORAL | Status: DC | PRN

## 2020-12-11 MED ORDER — PHENYLEPHRINE HCL 10 % OP SOLN
1.0000 [drp] | OPHTHALMIC | Status: DC | PRN
  Administered 2020-12-11 (×3): 1 [drp] via OPHTHALMIC

## 2020-12-11 MED ORDER — SIGHTPATH DOSE#1 NA CHONDROIT SULF-NA HYALURON 40-17 MG/ML IO SOLN
INTRAOCULAR | Status: DC | PRN
  Administered 2020-12-11: 1 mL via INTRAOCULAR

## 2020-12-11 MED ORDER — LACTATED RINGERS IV SOLN: INTRAVENOUS | Status: DC

## 2020-12-11 MED ORDER — ACETAMINOPHEN 325 MG PO TABS: 325.0000 mg | ORAL_TABLET | ORAL | Status: DC | PRN

## 2020-12-11 MED ORDER — CYCLOPENTOLATE HCL 2 % OP SOLN
1.0000 [drp] | OPHTHALMIC | Status: DC | PRN
  Administered 2020-12-11 (×3): 1 [drp] via OPHTHALMIC

## 2020-12-11 MED ORDER — SIGHTPATH DOSE#1 BSS IO SOLN
INTRAOCULAR | Status: DC | PRN
  Administered 2020-12-11: 67 mL via OPHTHALMIC

## 2020-12-11 MED ORDER — MOXIFLOXACIN HCL 0.5 % OP SOLN
OPHTHALMIC | Status: DC | PRN
  Administered 2020-12-11: 0.2 mL via OPHTHALMIC

## 2020-12-11 MED ORDER — SIGHTPATH DOSE#1 BSS IO SOLN
INTRAOCULAR | Status: DC | PRN
  Administered 2020-12-11: 2 mL

## 2020-12-11 SURGICAL SUPPLY — 12 items
CANNULA ANT/CHMB 27GA (MISCELLANEOUS) ×4 IMPLANT
GOWN STRL REUS W/ TWL LRG LVL3 (GOWN DISPOSABLE) ×2 IMPLANT
GOWN STRL REUS W/TWL LRG LVL3 (GOWN DISPOSABLE) ×4
LENS IOL TECNIS EYHANCE 22.5 (Intraocular Lens) ×2 IMPLANT
MARKER SKIN DUAL TIP RULER LAB (MISCELLANEOUS) ×2 IMPLANT
NEEDLE FILTER BLUNT 18X 1/2SAF (NEEDLE) ×1
NEEDLE FILTER BLUNT 18X1 1/2 (NEEDLE) ×1 IMPLANT
PACK EYE AFTER SURG (MISCELLANEOUS) ×2 IMPLANT
SYR 3ML LL SCALE MARK (SYRINGE) ×2 IMPLANT
SYR TB 1ML LUER SLIP (SYRINGE) ×2 IMPLANT
WATER STERILE IRR 250ML POUR (IV SOLUTION) ×2 IMPLANT
WIPE NON LINTING 3.25X3.25 (MISCELLANEOUS) ×2 IMPLANT

## 2020-12-11 NOTE — Anesthesia Preprocedure Evaluation (Signed)
Anesthesia Evaluation  Patient identified by MRN, date of birth, ID band Patient awake    Reviewed: Allergy & Precautions, H&P , NPO status , Patient's Chart, lab work & pertinent test results, reviewed documented beta blocker date and time   Airway Mallampati: III  TM Distance: >3 FB Neck ROM: full    Dental no notable dental hx.    Pulmonary sleep apnea ,  Hx of allergic asthma, no problem now   Pulmonary exam normal breath sounds clear to auscultation       Cardiovascular Exercise Tolerance: Good hypertension, Normal cardiovascular exam Rhythm:regular Rate:Normal     Neuro/Psych negative neurological ROS  negative psych ROS   GI/Hepatic Neg liver ROS, GERD  Medicated and Controlled,  Endo/Other  diabetesHypothyroidism No issue with DM after weight loss.  Renal/GU negative Renal ROS  negative genitourinary   Musculoskeletal   Abdominal   Peds  Hematology negative hematology ROS (+)   Anesthesia Other Findings   Reproductive/Obstetrics negative OB ROS                             Anesthesia Physical Anesthesia Plan  ASA: 2  Anesthesia Plan: MAC   Post-op Pain Management:    Induction:   PONV Risk Score and Plan:   Airway Management Planned:   Additional Equipment:   Intra-op Plan:   Post-operative Plan:   Informed Consent: I have reviewed the patients History and Physical, chart, labs and discussed the procedure including the risks, benefits and alternatives for the proposed anesthesia with the patient or authorized representative who has indicated his/her understanding and acceptance.     Dental Advisory Given  Plan Discussed with: CRNA and Anesthesiologist  Anesthesia Plan Comments:         Anesthesia Quick Evaluation

## 2020-12-11 NOTE — Transfer of Care (Signed)
Immediate Anesthesia Transfer of Care Note  Patient: Leah Santos  Procedure(s) Performed: CATARACT EXTRACTION PHACO AND INTRAOCULAR LENS PLACEMENT (IOC) LEFT DIABETIC (Left)  Patient Location: PACU  Anesthesia Type: MAC  Level of Consciousness: awake, alert  and patient cooperative  Airway and Oxygen Therapy: Patient Spontanous Breathing and Patient connected to supplemental oxygen  Post-op Assessment: Post-op Vital signs reviewed, Patient's Cardiovascular Status Stable, Respiratory Function Stable, Patent Airway and No signs of Nausea or vomiting  Post-op Vital Signs: Reviewed and stable  Complications: No notable events documented.

## 2020-12-11 NOTE — Anesthesia Postprocedure Evaluation (Signed)
Anesthesia Post Note  Patient: Leah Santos  Procedure(s) Performed: CATARACT EXTRACTION PHACO AND INTRAOCULAR LENS PLACEMENT (IOC) LEFT DIABETIC 6.27 00:40.3 (Left: Eye)     Patient location during evaluation: PACU Anesthesia Type: MAC Level of consciousness: awake and alert Pain management: pain level controlled Vital Signs Assessment: post-procedure vital signs reviewed and stable Respiratory status: spontaneous breathing, nonlabored ventilation, respiratory function stable and patient connected to nasal cannula oxygen Cardiovascular status: stable and blood pressure returned to baseline Postop Assessment: no apparent nausea or vomiting Anesthetic complications: no   No notable events documented.  Trecia Rogers

## 2020-12-11 NOTE — H&P (Signed)
[place]   Primary Care Physician:  Rusty Aus, MD Ophthalmologist: Dr. George Ina  Pre-Procedure History & Physical: HPI:  Leah Santos is a 80 y.o. female here for cataract surgery.   Past Medical History:  Diagnosis Date   Anemia    Asthma    Barrett's esophagus    Cellulitis    Right lower extremity   Chronic acquired lymphedema    since childhood   Diabetes mellitus without complication ([place])    GERD (gastroesophageal reflux disease)    History of cellulitis    History of chemotherapy    finished total 6 cycles of chemotherapy(initially with carboplatinum Taxol followed by carboplatinum and gemcitabine(February, 2015)   History of colon polyps    History of colonoscopy with polypectomy 11/07/2013   History of genital warts 1986   History of mammogram 04/29/2014   HTN (hypertension)    Hx of dysplastic nevus 07/21/2007   Mid back. Moderate atypia, changes extend focally to one edge.   Hypothyroidism    IBS (irritable bowel syndrome)    Neuropathy    feet   Ovarian cancer on left (HCC)    No BRCA mutation-Left ovarian cancer, adenocarcinoma.pT2 pNO  M0     Past Surgical History:  Procedure Laterality Date   BREAST BIOPSY Right 1994   neg   BREAST EXCISIONAL BIOPSY Right 1962   CHOLECYSTECTOMY  1996   COLONOSCOPY W/ POLYPECTOMY  11/07/13   COLONOSCOPY WITH PROPOFOL N/A 11/29/2018   Procedure: COLONOSCOPY WITH PROPOFOL;  Surgeon: Lollie Sails, MD;  Location: [facility] ENDOSCOPY;  Service: Endoscopy;  Laterality: N/A;   ESOPHAGOGASTRODUODENOSCOPY (EGD) WITH PROPOFOL N/A 11/29/2018   Procedure: ESOPHAGOGASTRODUODENOSCOPY (EGD) WITH PROPOFOL;  Surgeon: Lollie Sails, MD;  Location[facility] ENDOSCOPY;  Service: Endoscopy;  Laterality: N/A;   ESOPHAGOGASTRODUODENOSCOPY ENDOSCOPY  11/07/13   Exp. Laparotomy, TAH, Salpingo-oophorectomy, Appendectomy  12/2012   HEMORRHOID SURGERY  1963   TUBAL LIGATION      Prior to Admission medications   Medication  Sig Start Date End Date Taking? Authorizing Provider  Cholecalciferol (HM VITAMIN D3) 50 MCG (2000 UT) CAPS Take 1 capsule by mouth daily.   Yes [provider]  colchicine 0.6 MG tablet Take 1 tablet by mouth 2 (two) times daily as needed. 07/16/20  Yes [provider]  colestipol (COLESTID) 1 g tablet Take 1 tablet by mouth daily as needed. 06/15/19  Yes [provider]  cycloSPORINE (RESTASIS) 0.05 % ophthalmic emulsion Place 1 drop into both eyes 2 (two) times daily.   Yes [provider]  famotidine (PEPCID) 40 MG tablet Take by mouth. 01/11/20  Yes [provider]  fluticasone (FLONASE) 50 MCG/ACT nasal spray Place 2 sprays into both nostrils daily.   Yes [provider]  furosemide (LASIX) 20 MG tablet Take by mouth as needed. 07/12/20 12/04/20 Yes [provider]  hydrocortisone 2.5 % cream Apply twice daily as needed to rash at groin, below breasts and at abdomen up to 1 week. Always use together with antifungal cream. 07/27/19  Yes Moye, [place], MD  levothyroxine (SYNTHROID) 75 MCG tablet Take by mouth. 02/15/20  Yes [provider]  pantoprazole (PROTONIX) 40 MG tablet Take 40 mg by mouth daily as needed.   Yes [provider]  potassium chloride (KLOR-CON) 10 MEQ tablet Take 40 mEq by mouth daily.  03/23/14  Yes [provider]  triamterene-hydrochlorothiazide (MAXZIDE-25) 37.5-25 MG per tablet Take 1 tablet by mouth daily. 03/23/14  Yes [provider]  venlafaxine XR (EFFEXOR-XR) 75 MG 24 hr capsule Take by mouth. 02/15/20 02/14/21 Yes [provider]  zinc gluconate 50 MG tablet Take 50 mg by mouth daily.   Yes [provider]    Allergies as of 10/17/2020 - Review Complete 07/19/2020  Allergen Reaction Noted   Levofloxacin Other (See Comments) 12/28/2017   Metformin Other (See Comments) 08/09/2019   Statins Other (See Comments) 09/29/2014   Taxol [paclitaxel]  09/29/2014    Sulfa antibiotics Rash 09/29/2014   Tape Rash 09/29/2014    Family History  Problem Relation Age of Onset   Ovarian cancer Mother 91   Breast cancer Maternal Aunt    Breast cancer Maternal Aunt 70   Lung cancer Maternal Uncle 75   Prostate cancer Maternal Uncle 65   Uterine cancer Maternal Aunt 73    Social History   Socioeconomic History   Marital status: Married    Spouse name: Not on file   Number of children: Not on file   Years of education: Not on file   Highest education level: Not on file  Occupational History   Not on file  Tobacco Use   Smoking status: Never   Smokeless tobacco: Never  Vaping Use   Vaping Use: Never used  Substance and Sexual Activity   Alcohol use: No    Alcohol/week: 0.0 standard drinks   Drug use: No   Sexual activity: Not on file  Other Topics Concern   Not on file  Social History Narrative   Not on file   Social Determinants of Health   Financial Resource Strain: Not on file  Food Insecurity: Not on file  Transportation Needs: Not on file  Physical Activity: Not on file  Stress: Not on file  Social Connections: Not on file  Intimate Partner Violence: Not on file    Review of Systems: See HPI, otherwise negative ROS  Physical Exam: BP 136/78   Pulse 71   Temp (!) 97.1 F (36.2 C) (Temporal)   Resp 18   Ht $R'5\' 3"'QA$  (1.6 m)   Wt 79.4 kg   SpO2 96%   BMI 31.00 kg/m  General:   Alert, cooperative in NAD Head:  Normocephalic and atraumatic. Respiratory:  Normal work of breathing. Cardiovascular:  RRR  Impression/Plan: Leah Santos is here for cataract surgery.  Risks, benefits, limitations, and alternatives regarding cataract surgery have been reviewed with the patient.  Questions have been answered.  All parties agreeable.   Birder Robson, MD  12/11/2020, 8:20 AM

## 2020-12-11 NOTE — Op Note (Signed)
PREOPERATIVE DIAGNOSIS:  Nuclear sclerotic cataract of the left eye.   POSTOPERATIVE DIAGNOSIS:  Nuclear sclerotic cataract of the left eye.   OPERATIVE PROCEDURE:ORPROCALL@   SURGEON:  Birder Robson, MD.   ANESTHESIA:  Anesthesiologist: Rochel Brome, MD CRNA: Cameron Ali, CRNA  1.      Managed anesthesia care. 2.     0.27m of Shugarcaine was instilled following the paracentesis   COMPLICATIONS:  None.   TECHNIQUE:   Stop and chop   DESCRIPTION OF PROCEDURE:  The patient was examined and consented in the preoperative holding area where the aforementioned topical anesthesia was applied to the left eye and then brought back to the Operating Room where the left eye was prepped and draped in the usual sterile ophthalmic fashion and a lid speculum was placed. A paracentesis was created with the side port blade and the anterior chamber was filled with viscoelastic. A near clear corneal incision was performed with the steel keratome. A continuous curvilinear capsulorrhexis was performed with a cystotome followed by the capsulorrhexis forceps. Hydrodissection and hydrodelineation were carried out with BSS on a blunt cannula. The lens was removed in a stop and chop  technique and the remaining cortical material was removed with the irrigation-aspiration handpiece. The capsular bag was inflated with viscoelastic and the Technis ZCB00 lens was placed in the capsular bag without complication. The remaining viscoelastic was removed from the eye with the irrigation-aspiration handpiece. The wounds were hydrated. The anterior chamber was flushed with BSS and the eye was inflated to physiologic pressure. 0.134mVigamox was placed in the anterior chamber. The wounds were found to be water tight. The eye was dressed with Combigan. The patient was given protective glasses to wear throughout the day and a shield with which to sleep tonight. The patient was also given drops with which to begin a drop regimen today  and will follow-up with me in one day. Implant Name Type Inv. Item Serial No. Manufacturer Lot No. LRB No. Used Action  LENS IOL TECNIS EYHANCE 22.5 - [id number] Lens LENS IOL TECNIS EYHANCE 22.5 [id number]   Left 1 Implanted    Procedure(s): CATARACT EXTRACTION PHACO AND INTRAOCULAR LENS PLACEMENT (IOC) LEFT DIABETIC 6.27 00:40.3 (Left)  Electronically signed: WiBirder Robson/23/2022 8:44 AM

## 2020-12-11 NOTE — Anesthesia Procedure Notes (Signed)
Procedure Name: MAC Date/Time: 12/11/2020 8:27 AM Performed by: Cameron Ali, CRNA Pre-anesthesia Checklist: Patient identified, Emergency Drugs available, Suction available, Timeout performed and Patient being monitored Patient Re-evaluated:Patient Re-evaluated prior to induction Oxygen Delivery Method: Nasal cannula Placement Confirmation: positive ETCO2

## 2020-12-12 ENCOUNTER — Encounter: Payer: Self-pay | Admitting: Ophthalmology

## 2020-12-25 ENCOUNTER — Ambulatory Visit: Payer: Medicare Other | Admitting: Anesthesiology

## 2020-12-25 ENCOUNTER — Other Ambulatory Visit: Payer: Self-pay

## 2020-12-25 ENCOUNTER — Encounter
Admission: RE | Disposition: A | Discharge status: Home / Self Care | Payer: Self-pay | Source: Home / Self Care | Attending: Ophthalmology

## 2020-12-25 ENCOUNTER — Ambulatory Visit
Admission: RE | Admit: 2020-12-25 | Discharge: 2020-12-25 | Disposition: A | Discharge status: Home / Self Care | Payer: Medicare Other | Attending: Ophthalmology | Admitting: Ophthalmology

## 2020-12-25 ENCOUNTER — Encounter: Payer: Self-pay | Admitting: Ophthalmology

## 2020-12-25 DIAGNOSIS — Z79899 Other long term (current) drug therapy: Secondary | ICD-10-CM | POA: Insufficient documentation

## 2020-12-25 DIAGNOSIS — Z8543 Personal history of malignant neoplasm of ovary: Secondary | ICD-10-CM | POA: Insufficient documentation

## 2020-12-25 DIAGNOSIS — Z881 Allergy status to other antibiotic agents status: Secondary | ICD-10-CM | POA: Insufficient documentation

## 2020-12-25 DIAGNOSIS — Z91048 Other nonmedicinal substance allergy status: Secondary | ICD-10-CM | POA: Diagnosis not present

## 2020-12-25 DIAGNOSIS — Z888 Allergy status to other drugs, medicaments and biological substances status: Secondary | ICD-10-CM | POA: Diagnosis not present

## 2020-12-25 DIAGNOSIS — Z79890 Hormone replacement therapy (postmenopausal): Secondary | ICD-10-CM | POA: Diagnosis not present

## 2020-12-25 DIAGNOSIS — Z9221 Personal history of antineoplastic chemotherapy: Secondary | ICD-10-CM | POA: Insufficient documentation

## 2020-12-25 DIAGNOSIS — H2511 Age-related nuclear cataract, right eye: Secondary | ICD-10-CM | POA: Insufficient documentation

## 2020-12-25 DIAGNOSIS — Z882 Allergy status to sulfonamides status: Secondary | ICD-10-CM | POA: Diagnosis not present

## 2020-12-25 DIAGNOSIS — E1136 Type 2 diabetes mellitus with diabetic cataract: Secondary | ICD-10-CM | POA: Insufficient documentation

## 2020-12-25 HISTORY — PX: CATARACT EXTRACTION W/PHACO: SHX586

## 2020-12-25 SURGERY — PHACOEMULSIFICATION, CATARACT, WITH IOL INSERTION
Anesthesia: Monitor Anesthesia Care | Site: Eye | Laterality: Right

## 2020-12-25 MED ORDER — MOXIFLOXACIN HCL 0.5 % OP SOLN
OPHTHALMIC | Status: DC | PRN
  Administered 2020-12-25: 0.2 mL via OPHTHALMIC

## 2020-12-25 MED ORDER — SIGHTPATH DOSE#1 BSS IO SOLN
INTRAOCULAR | Status: DC | PRN
  Administered 2020-12-25: 15 mL

## 2020-12-25 MED ORDER — CYCLOPENTOLATE HCL 2 % OP SOLN
1.0000 [drp] | OPHTHALMIC | Status: AC
  Administered 2020-12-25 (×3): 1 [drp] via OPHTHALMIC

## 2020-12-25 MED ORDER — MIDAZOLAM HCL 2 MG/2ML IJ SOLN
INTRAMUSCULAR | Status: DC | PRN
  Administered 2020-12-25: 1 mg via INTRAVENOUS

## 2020-12-25 MED ORDER — SIGHTPATH DOSE#1 NA CHONDROIT SULF-NA HYALURON 40-17 MG/ML IO SOLN
INTRAOCULAR | Status: DC | PRN
  Administered 2020-12-25: 1 mL via INTRAOCULAR

## 2020-12-25 MED ORDER — OXYCODONE HCL 5 MG/5ML PO SOLN
5.0000 mg | Freq: Once | ORAL | Status: DC | PRN
Start: 2020-12-25 — End: 2020-12-25

## 2020-12-25 MED ORDER — SIGHTPATH DOSE#1 BSS IO SOLN
INTRAOCULAR | Status: DC | PRN
  Administered 2020-12-25: 1 mL

## 2020-12-25 MED ORDER — TETRACAINE HCL 0.5 % OP SOLN
1.0000 [drp] | OPHTHALMIC | Status: DC | PRN
  Administered 2020-12-25 (×3): 1 [drp] via OPHTHALMIC

## 2020-12-25 MED ORDER — PHENYLEPHRINE HCL 10 % OP SOLN
1.0000 [drp] | OPHTHALMIC | Status: AC
  Administered 2020-12-25 (×3): 1 [drp] via OPHTHALMIC

## 2020-12-25 MED ORDER — SIGHTPATH DOSE#1 BSS IO SOLN
INTRAOCULAR | Status: DC | PRN
  Administered 2020-12-25: 69 mL via OPHTHALMIC

## 2020-12-25 MED ORDER — OXYCODONE HCL 5 MG PO TABS
5.0000 mg | ORAL_TABLET | Freq: Once | ORAL | Status: DC | PRN
Start: 2020-12-25 — End: 2020-12-25

## 2020-12-25 MED ORDER — FENTANYL CITRATE (PF) 100 MCG/2ML IJ SOLN
INTRAMUSCULAR | Status: DC | PRN
  Administered 2020-12-25: 25 ug via INTRAVENOUS

## 2020-12-25 MED ORDER — BRIMONIDINE TARTRATE-TIMOLOL 0.2-0.5 % OP SOLN
OPHTHALMIC | Status: DC | PRN
  Administered 2020-12-25: 1 [drp] via OPHTHALMIC

## 2020-12-25 MED ORDER — LACTATED RINGERS IV SOLN: INTRAVENOUS | Status: DC

## 2020-12-25 SURGICAL SUPPLY — 17 items
CANNULA ANT/CHMB 27GA (MISCELLANEOUS) ×4 IMPLANT
GLOVE SURG POLYISO LF SZ7 (GLOVE) ×2 IMPLANT
GLOVE SURG POLYISO LF SZ7.5 (GLOVE) ×2 IMPLANT
GLOVE SURG POLYISOPRENE 8.5 (GLOVE) ×2 IMPLANT
GOWN STRL REUS W/ TWL LRG LVL3 (GOWN DISPOSABLE) ×2 IMPLANT
GOWN STRL REUS W/TWL LRG LVL3 (GOWN DISPOSABLE) ×4
LENS IOL TECNIS EYHANCE 22.5 (Intraocular Lens) ×2 IMPLANT
MARKER SKIN DUAL TIP RULER LAB (MISCELLANEOUS) ×2 IMPLANT
NEEDLE FILTER BLUNT 18X 1/2SAF (NEEDLE) ×1
NEEDLE FILTER BLUNT 18X1 1/2 (NEEDLE) ×1 IMPLANT
PACK EYE AFTER SURG (MISCELLANEOUS) ×2 IMPLANT
SUT ETHILON 10-0 CS-B-6CS-B-6 (SUTURE)
SUTURE EHLN 10-0 CS-B-6CS-B-6 (SUTURE) IMPLANT
SYR 3ML LL SCALE MARK (SYRINGE) ×2 IMPLANT
SYR TB 1ML LUER SLIP (SYRINGE) ×2 IMPLANT
WATER STERILE IRR 250ML POUR (IV SOLUTION) ×2 IMPLANT
WIPE NON LINTING 3.25X3.25 (MISCELLANEOUS) ×2 IMPLANT

## 2020-12-25 NOTE — Anesthesia Preprocedure Evaluation (Signed)
Anesthesia Evaluation  Patient identified by MRN, date of birth, ID band Patient awake    Reviewed: Allergy & Precautions, H&P , NPO status , Patient's Chart, lab work & pertinent test results  Airway Mallampati: II  TM Distance: >3 FB Neck ROM: full    Dental no notable dental hx.    Pulmonary asthma , sleep apnea ,    Pulmonary exam normal        Cardiovascular hypertension, Normal cardiovascular exam Rhythm:regular Rate:Normal     Neuro/Psych    GI/Hepatic GERD  ,  Endo/Other  diabetes, Well Controlled, Type 2Hypothyroidism   Renal/GU      Musculoskeletal   Abdominal   Peds  Hematology  (+) Blood dyscrasia, anemia ,   Anesthesia Other Findings   Reproductive/Obstetrics                            Anesthesia Physical Anesthesia Plan  ASA: 2  Anesthesia Plan: MAC   Post-op Pain Management:    Induction:   PONV Risk Score and Plan: 2 and Midazolam  Airway Management Planned:   Additional Equipment:   Intra-op Plan:   Post-operative Plan:   Informed Consent: I have reviewed the patients History and Physical, chart, labs and discussed the procedure including the risks, benefits and alternatives for the proposed anesthesia with the patient or authorized representative who has indicated his/her understanding and acceptance.       Plan Discussed with:   Anesthesia Plan Comments:         Anesthesia Quick Evaluation

## 2020-12-25 NOTE — Op Note (Signed)
PREOPERATIVE DIAGNOSIS:  Nuclear sclerotic cataract of the right eye.   POSTOPERATIVE DIAGNOSIS:  Cataract   OPERATIVE PROCEDURE:ORPROCALL@   SURGEON:  Birder Robson, MD.   ANESTHESIA:  Anesthesiologist: Elgie Collard, MD CRNA: Dionne Bucy, CRNA  1.      Managed anesthesia care. 2.      0.75m of Shugarcaine was instilled in the eye following the paracentesis.   COMPLICATIONS:  None.   TECHNIQUE:   Stop and chop   DESCRIPTION OF PROCEDURE:  The patient was examined and consented in the preoperative holding area where the aforementioned topical anesthesia was applied to the right eye and then brought back to the Operating Room where the right eye was prepped and draped in the usual sterile ophthalmic fashion and a lid speculum was placed. A paracentesis was created with the side port blade and the anterior chamber was filled with viscoelastic. A near clear corneal incision was performed with the steel keratome. A continuous curvilinear capsulorrhexis was performed with a cystotome followed by the capsulorrhexis forceps. Hydrodissection and hydrodelineation were carried out with BSS on a blunt cannula. The lens was removed in a stop and chop  technique and the remaining cortical material was removed with the irrigation-aspiration handpiece. The capsular bag was inflated with viscoelastic and the Technis ZCB00  lens was placed in the capsular bag without complication. The remaining viscoelastic was removed from the eye with the irrigation-aspiration handpiece. The wounds were hydrated. The anterior chamber was flushed with BSS and the eye was inflated to physiologic pressure. 0.121mof Vigamox was placed in the anterior chamber. The wounds were found to be water tight. The eye was dressed with Combigan. The patient was given protective glasses to wear throughout the day and a shield with which to sleep tonight. The patient was also given drops with which to begin a drop regimen today and will  follow-up with me in one day. Implant Name Type Inv. Item Serial No. Manufacturer Lot No. LRB No. Used Action  LENS IOL TECNIS EYHANCE 22.5 - [id number] Lens LENS IOL TECNIS EYHANCE 22.5 [id number]   Right 1 Implanted   Procedure(s) with comments: CATARACT EXTRACTION PHACO AND INTRAOCULAR LENS PLACEMENT (IOC) RIGHT DiABETIC (Right) - 5.06 0:39.2  Electronically signed: WiBirder Robson/09/2020 7:49 AM

## 2020-12-25 NOTE — Transfer of Care (Signed)
Immediate Anesthesia Transfer of Care Note  Patient: Leah Santos  Procedure(s) Performed: CATARACT EXTRACTION PHACO AND INTRAOCULAR LENS PLACEMENT (IOC) RIGHT DiABETIC (Right: Eye)  Patient Location: PACU  Anesthesia Type: MAC  Level of Consciousness: awake, alert  and patient cooperative  Airway and Oxygen Therapy: Patient Spontanous Breathing and Patient connected to supplemental oxygen  Post-op Assessment: Post-op Vital signs reviewed, Patient's Cardiovascular Status Stable, Respiratory Function Stable, Patent Airway and No signs of Nausea or vomiting  Post-op Vital Signs: Reviewed and stable  Complications: No notable events documented.

## 2020-12-25 NOTE — H&P (Signed)
[place]   Primary Care Physician:  Rusty Aus, MD Ophthalmologist: Dr. George Ina  Pre-Procedure History & Physical: HPI:  Leah Santos is a 80 y.o. female here for cataract surgery.   Past Medical History:  Diagnosis Date   Anemia    Asthma    Barrett's esophagus    Cellulitis    Right lower extremity   Chronic acquired lymphedema    since childhood   Diabetes mellitus without complication ([place])    GERD (gastroesophageal reflux disease)    History of cellulitis    History of chemotherapy    finished total 6 cycles of chemotherapy(initially with carboplatinum Taxol followed by carboplatinum and gemcitabine(February, 2015)   History of colon polyps    History of colonoscopy with polypectomy 11/07/2013   History of genital warts 1986   History of mammogram 04/29/2014   HTN (hypertension)    Hx of dysplastic nevus 07/21/2007   Mid back. Moderate atypia, changes extend focally to one edge.   Hypothyroidism    IBS (irritable bowel syndrome)    Neuropathy    feet   Ovarian cancer on left (HCC)    No BRCA mutation-Left ovarian cancer, adenocarcinoma.pT2 pNO  M0     Past Surgical History:  Procedure Laterality Date   BREAST BIOPSY Right 1994   neg   BREAST EXCISIONAL BIOPSY Right 1962   CATARACT EXTRACTION W/PHACO Left 12/11/2020   Procedure: CATARACT EXTRACTION PHACO AND INTRAOCULAR LENS PLACEMENT (IOC) LEFT DIABETIC 6.27 00:40.3;  Surgeon: Birder Robson, MD;  Location: [facility];  Service: Ophthalmology;  Laterality: Left;   CHOLECYSTECTOMY  1996   COLONOSCOPY W/ POLYPECTOMY  11/07/13   COLONOSCOPY WITH PROPOFOL N/A 11/29/2018   Procedure: COLONOSCOPY WITH PROPOFOL;  Surgeon: Lollie Sails, MD;  Location: [facility] ENDOSCOPY;  Service: Endoscopy;  Laterality: N/A;   ESOPHAGOGASTRODUODENOSCOPY (EGD) WITH PROPOFOL N/A 11/29/2018   Procedure: ESOPHAGOGASTRODUODENOSCOPY (EGD) WITH PROPOFOL;  Surgeon: Lollie Sails, MD;  Location: [facility]  ENDOSCOPY;  Service: Endoscopy;  Laterality: N/A;   ESOPHAGOGASTRODUODENOSCOPY ENDOSCOPY  11/07/13   Exp. Laparotomy, TAH, Salpingo-oophorectomy, Appendectomy  12/2012   HEMORRHOID SURGERY  1963   TUBAL LIGATION      Prior to Admission medications   Medication Sig Start Date End Date Taking? Authorizing Provider  Cholecalciferol (HM VITAMIN D3) 50 MCG (2000 UT) CAPS Take 1 capsule by mouth daily.   Yes [provider]  colchicine 0.6 MG tablet Take 1 tablet by mouth 2 (two) times daily as needed. 07/16/20  Yes [provider]  colestipol (COLESTID) 1 g tablet Take 1 tablet by mouth daily as needed. 06/15/19  Yes [provider]  cycloSPORINE (RESTASIS) 0.05 % ophthalmic emulsion Place 1 drop into both eyes 2 (two) times daily.   Yes [provider]  famotidine (PEPCID) 40 MG tablet Take by mouth. 01/11/20  Yes [provider]  fluticasone (FLONASE) 50 MCG/ACT nasal spray Place 2 sprays into both nostrils daily.   Yes [provider]  furosemide (LASIX) 20 MG tablet Take by mouth as needed. 07/12/20 12/25/20 Yes [provider]  hydrocortisone 2.5 % cream Apply twice daily as needed to rash at groin, below breasts and at abdomen up to 1 week. Always use together with antifungal cream. 07/27/19  Yes Moye, [place], MD  levothyroxine (SYNTHROID) 75 MCG tablet Take by mouth. 02/15/20  Yes [provider]  pantoprazole (PROTONIX) 40 MG tablet Take 40 mg by mouth daily as needed.   Yes [provider]  potassium chloride (KLOR-CON) 10 MEQ tablet Take 40 mEq by mouth daily.  03/23/14  Yes [provider]  triamterene-hydrochlorothiazide (MAXZIDE-25) 37.5-25 MG per tablet Take 1 tablet by mouth daily. 03/23/14  Yes [provider]  venlafaxine XR (EFFEXOR-XR) 75 MG 24 hr capsule Take by mouth. 02/15/20 02/14/21 Yes [provider]  zinc gluconate 50 MG tablet Take 50 mg by mouth daily.   Yes [provider]    Allergies as of 10/17/2020 - Review Complete 07/19/2020  Allergen Reaction Noted   Levofloxacin Other (See Comments) 12/28/2017   Metformin Other (See Comments) 08/09/2019   Statins Other (See Comments) 09/29/2014   Taxol [paclitaxel]  09/29/2014   Sulfa antibiotics Rash 09/29/2014   Tape Rash 09/29/2014    Family History  Problem Relation Age of Onset   Ovarian cancer Mother 19   Breast cancer Maternal Aunt    Breast cancer Maternal Aunt 70   Lung cancer Maternal Uncle 75   Prostate cancer Maternal Uncle 65   Uterine cancer Maternal Aunt 51    Social History   Socioeconomic History   Marital status: Married    Spouse name: Not on file   Number of children: Not on file   Years of education: Not on file   Highest education level: Not on file  Occupational History   Not on file  Tobacco Use   Smoking status: Never   Smokeless tobacco: Never  Vaping Use   Vaping Use: Never used  Substance and Sexual Activity   Alcohol use: No    Alcohol/week: 0.0 standard drinks   Drug use: No   Sexual activity: Not on file  Other Topics Concern   Not on file  Social History Narrative   Not on file   Social Determinants of Health   Financial Resource Strain: Not on file  Food Insecurity: Not on file  Transportation Needs: Not on file  Physical Activity: Not on file  Stress: Not on file  Social Connections: Not on file  Intimate Partner Violence: Not on file    Review of Systems: See HPI, otherwise negative ROS  Physical Exam: BP 135/87   Pulse 67   Temp (!) 97.2 F (36.2 C) (Temporal)   Resp 16   Ht $R'5\' 3"'PT$  (1.6 m)   Wt 79.4 kg   SpO2 96%   BMI 31.00 kg/m  General:   Alert, cooperative in NAD Head:  Normocephalic and atraumatic. Respiratory:  Normal work of breathing. Cardiovascular:  RRR  Impression/Plan: Leah Santos is here for cataract surgery.  Risks, benefits, limitations, and alternatives regarding cataract surgery have been  reviewed with the patient.  Questions have been answered.  All parties agreeable.   Birder Robson, MD  12/25/2020, 7:17 AM

## 2020-12-25 NOTE — Anesthesia Procedure Notes (Signed)
Procedure Name: MAC Date/Time: 12/25/2020 7:32 AM Performed by: Dionne Bucy, CRNA Pre-anesthesia Checklist: Patient identified, Emergency Drugs available, Suction available, Patient being monitored and Timeout performed Patient Re-evaluated:Patient Re-evaluated prior to induction Oxygen Delivery Method: Nasal cannula Placement Confirmation: positive ETCO2

## 2020-12-25 NOTE — Anesthesia Postprocedure Evaluation (Signed)
Anesthesia Post Note  Patient: Leah Santos  Procedure(s) Performed: CATARACT EXTRACTION PHACO AND INTRAOCULAR LENS PLACEMENT (IOC) RIGHT DiABETIC (Right: Eye)     Patient location during evaluation: PACU Anesthesia Type: MAC Level of consciousness: awake and alert Pain management: pain level controlled Vital Signs Assessment: post-procedure vital signs reviewed and stable Respiratory status: spontaneous breathing Cardiovascular status: stable Anesthetic complications: no   No notable events documented.  Gillian Scarce

## 2020-12-25 NOTE — Discharge Instructions (Signed)

## 2020-12-26 ENCOUNTER — Encounter: Payer: Self-pay | Admitting: Ophthalmology

## 2021-03-04 ENCOUNTER — Other Ambulatory Visit: Payer: Self-pay | Admitting: Internal Medicine

## 2021-03-04 DIAGNOSIS — Z1231 Encounter for screening mammogram for malignant neoplasm of breast: Secondary | ICD-10-CM

## 2021-05-23 ENCOUNTER — Ambulatory Visit
Admission: RE | Admit: 2021-05-23 | Discharge: 2021-05-23 | Disposition: A | Discharge status: Home / Self Care | Payer: Medicare Other | Source: Ambulatory Visit | Attending: Internal Medicine | Admitting: Internal Medicine

## 2021-05-23 ENCOUNTER — Other Ambulatory Visit: Payer: Self-pay

## 2021-05-23 DIAGNOSIS — Z1231 Encounter for screening mammogram for malignant neoplasm of breast: Secondary | ICD-10-CM | POA: Diagnosis present

## 2022-02-12 ENCOUNTER — Ambulatory Visit (INDEPENDENT_AMBULATORY_CARE_PROVIDER_SITE_OTHER): Payer: Medicare Other | Admitting: Dermatology

## 2022-02-12 DIAGNOSIS — C44719 Basal cell carcinoma of skin of left lower limb, including hip: Secondary | ICD-10-CM

## 2022-02-12 DIAGNOSIS — L570 Actinic keratosis: Secondary | ICD-10-CM

## 2022-02-12 DIAGNOSIS — D239 Other benign neoplasm of skin, unspecified: Secondary | ICD-10-CM

## 2022-02-12 DIAGNOSIS — L820 Inflamed seborrheic keratosis: Secondary | ICD-10-CM

## 2022-02-12 DIAGNOSIS — L814 Other melanin hyperpigmentation: Secondary | ICD-10-CM

## 2022-02-12 DIAGNOSIS — L821 Other seborrheic keratosis: Secondary | ICD-10-CM

## 2022-02-12 DIAGNOSIS — D489 Neoplasm of uncertain behavior, unspecified: Secondary | ICD-10-CM

## 2022-02-12 DIAGNOSIS — C4491 Basal cell carcinoma of skin, unspecified: Secondary | ICD-10-CM

## 2022-02-12 DIAGNOSIS — D229 Melanocytic nevi, unspecified: Secondary | ICD-10-CM

## 2022-02-12 DIAGNOSIS — L578 Other skin changes due to chronic exposure to nonionizing radiation: Secondary | ICD-10-CM

## 2022-02-12 DIAGNOSIS — D2272 Melanocytic nevi of left lower limb, including hip: Secondary | ICD-10-CM | POA: Diagnosis not present

## 2022-02-12 DIAGNOSIS — L304 Erythema intertrigo: Secondary | ICD-10-CM | POA: Diagnosis not present

## 2022-02-12 DIAGNOSIS — Z1283 Encounter for screening for malignant neoplasm of skin: Secondary | ICD-10-CM | POA: Diagnosis not present

## 2022-02-12 HISTORY — DX: Basal cell carcinoma of skin, unspecified: C44.91

## 2022-02-12 HISTORY — DX: Other benign neoplasm of skin, unspecified: D23.9

## 2022-02-12 NOTE — Patient Instructions (Addendum)
Intertrigo is a chronic recurrent rash that occurs in skin fold areas that may be associated with friction; heat; moisture; yeast; fungus; and bacteria.  It is exacerbated by increased movement / activity; sweating; and higher atmospheric temperature.   Continue using ketoconazole cream increasing to twice daily   Recommend drying with hair dryer and applying Zeasorb AF Powder to help keep area dry after shower to prevent recurrence   hydrocortisone 2.5 % cream to use twice a day as needed up to 1 week if needed.      Call if spot that were treated dont go away and will see sooner.   Biopsy Wound Care Instructions  Leave the original bandage on for 24 hours if possible.  If the bandage becomes soaked or soiled before that time, it is OK to remove it and examine the wound.  A small amount of post-operative bleeding is normal.  If excessive bleeding occurs, remove the bandage, place gauze over the site and apply continuous pressure (no peeking) over the area for 30 minutes. If this does not work, please call our clinic as soon as possible or page your doctor if it is after hours.   Once a day, cleanse the wound with soap and water. It is fine to shower. If a thick crust develops you may use a Q-tip dipped into dilute hydrogen peroxide (mix 1:1 with water) to dissolve it.  Hydrogen peroxide can slow the healing process, so use it only as needed.    After washing, apply petroleum jelly (Vaseline) or an antibiotic ointment if your doctor prescribed one for you, followed by a bandage.    For best healing, the wound should be covered with a layer of ointment at all times. If you are not able to keep the area covered with a bandage to hold the ointment in place, this may mean re-applying the ointment several times a day.  Continue this wound care until the wound has healed and is no longer open.   Itching and mild discomfort is normal during the healing process. However, if you develop pain or severe  itching, please call our office.   If you have any discomfort, you can take Tylenol (acetaminophen) or ibuprofen as directed on the bottle. (Please do not take these if you have an allergy to them or cannot take them for another reason).  Some redness, tenderness and white or yellow material in the wound is normal healing.  If the area becomes very sore and red, or develops a thick yellow-green material (pus), it may be infected; please notify us.    If you have stitches, return to clinic as directed to have the stitches removed. You will continue wound care for 2-3 days after the stitches are removed.   Wound healing continues for up to one year following surgery. It is not unusual to experience pain in the scar from time to time during the interval.  If the pain becomes severe or the scar thickens, you should notify the office.    A slight amount of redness in a scar is expected for the first six months.  After six months, the redness will fade and the scar will soften and fade.  The color difference becomes less noticeable with time.  If there are any problems, return for a post-op surgery check at your earliest convenience.  To improve the appearance of the scar, you can use silicone scar gel, cream, or sheets (such as Mederma or Serica) every night for up  to one year. These are available over the counter (without a prescription).  Please call our office at [phone] for any questions or concerns.      Actinic keratoses are precancerous spots that appear secondary to cumulative UV radiation exposure/sun exposure over time. They are chronic with expected duration over 1 year. A portion of actinic keratoses will progress to squamous cell carcinoma of the skin. It is not possible to reliably predict which spots will progress to skin cancer and so treatment is recommended to prevent development of skin cancer.  Recommend daily broad spectrum sunscreen SPF 30+ to sun-exposed areas, reapply  every 2 hours as needed.  Recommend staying in the shade or wearing long sleeves, sun glasses (UVA+UVB protection) and wide brim hats (4-inch brim around the entire circumference of the hat). Call for new or changing lesions.    Cryotherapy Aftercare  Wash gently with soap and water everyday.   Apply Vaseline and Band-Aid daily until healed.    Melanoma ABCDEs  Melanoma is the most dangerous type of skin cancer, and is the leading cause of death from skin disease.  You are more likely to develop melanoma if you: Have light-colored skin, light-colored eyes, or red or blond hair Spend a lot of time in the sun Tan regularly, either outdoors or in a tanning bed Have had blistering sunburns, especially during childhood Have a close family member who has had a melanoma Have atypical moles or large birthmarks  Early detection of melanoma is key since treatment is typically straightforward and cure rates are extremely high if we catch it early.   The first sign of melanoma is often a change in a mole or a new dark spot.  The ABCDE system is a way of remembering the signs of melanoma.  A for asymmetry:  The two halves do not match. B for border:  The edges of the growth are irregular. C for color:  A mixture of colors are present instead of an even brown color. D for diameter:  Melanomas are usually (but not always) greater than 20m - the size of a pencil eraser. E for evolution:  The spot keeps changing in size, shape, and color.  Please check your skin once per month between visits. You can use a small mirror in front and a large mirror behind you to keep an eye on the back side or your body.   If you see any new or changing lesions before your next follow-up, please call to schedule a visit.  Please continue daily skin protection including broad spectrum sunscreen SPF 30+ to sun-exposed areas, reapplying every 2 hours as needed when you're outdoors.   Staying in the shade or wearing  long sleeves, sun glasses (UVA+UVB protection) and wide brim hats (4-inch brim around the entire circumference of the hat) are also recommended for sun protection.    Due to recent changes in healthcare laws, you may see results of your pathology and/or laboratory studies on MyChart before the doctors have had a chance to review them. We understand that in some cases there may be results that are confusing or concerning to you. Please understand that not all results are received at the same time and often the doctors may need to interpret multiple results in order to provide you with the best plan of care or course of treatment. Therefore, we ask that you please give u[place]2 business days to thoroughly review all your results before contacting the office for clarification. Should  we see a critical lab result, you will be contacted sooner.   If You Need Anything After Your Visit  If you have any questions or concerns for your doctor, please call our main line at [phone] and press option 4 to reach your doctor's medical assistant. If no one answers, please leave a voicemail as directed and we will return your call as soon as possible. Messages left after 4 pm will be answered the following business day.   You may also send [place] a message via [place]. We typically respond to MyChart messages within 1-2 business days.  For prescription refills, please ask your pharmacy to contact our office. Our fax number is [phone].  If you have an urgent issue when the clinic is closed that cannot wait until the next business day, you can page your doctor at the number below.    Please note that while we do our best to be available for urgent issues outside of office hours, we are not available 24/7.   If you have an urgent issue and are unable to reach [place], you may choose to seek medical care at your doctor's office, retail clinic, urgent care center, or emergency room.  If you have a medical emergency, please  immediately call 911 or go to the emergency department.  Pager Numbers  - Dr. Nehemiah Massed: [phone]  - Dr. Laurence Ferrari: [phone]  - Dr. Nicole Kindred: [phone]  In the event of inclement weather, please call our main line at [phone] for an update on the status of any delays or closures.  Dermatology Medication Tips: Please keep the boxes that topical medications come in in order to help keep track of the instructions about where and how to use these. Pharmacies typically print the medication instructions only on the boxes and not directly on the medication tubes.   If your medication is too expensive, please contact our office at [phone] option 4 or send [place] a message through [place].   We are unable to tell what your co-pay for medications will be in advance as this is different depending on your insurance coverage. However, we may be able to find a substitute medication at lower cost or fill out paperwork to get insurance to cover a needed medication.   If a prior authorization is required to get your medication covered by your insurance company, please allow [place] 1-2 business days to complete this process.  Drug prices often vary depending on where the prescription is filled and some pharmacies may offer cheaper prices.  The website www.goodrx.com contains coupons for medications through different pharmacies. The prices here do not account for what the cost may be with help from insurance (it may be cheaper with your insurance), but the website can give you the price if you did not use any insurance.  - You can print the associated coupon and take it with your prescription to the pharmacy.  - You may also stop by our office during regular business hours and pick up a GoodRx coupon card.  - If you need your prescription sent electronically to a different pharmacy, notify our office through [facility] or by phone at [phone] option 4.     Si Usted Necesita Algo Despus  de Su Visita  Tambin puede enviarnos un mensaje a travs de Pharmacist, community. Por lo general respondemos a los mensajes de MyChart en el transcurso de 1 a 2 das hbiles.  Para renovar recetas, por favor pida a su farmacia que se ponga en  contacto con nuestra oficina. Harland Dingwall de fax es [place] [phone].  Si tiene un asunto urgente cuando la clnica est cerrada y que no puede esperar hasta el siguiente da hbil, puede llamar/localizar a su doctor(a) al nmero que aparece a continuacin.   Por favor, tenga en cuenta que aunque hacemos todo lo posible para estar disponibles para asuntos urgentes fuera del horario de [place], no estamos disponibles las 24 horas del da, los 7 das de la [place].   Si tiene un problema urgente y no puede comunicarse con nosotros, puede optar por buscar atencin mdica  en el consultorio de su doctor(a), en una clnica privada, en un centro de atencin urgente o en una sala de emergencias.  Si tiene Engineering geologist, por favor llame inmediatamente al 911 o vaya a la sala de emergencias.  Nmeros de bper  - Dr. Nehemiah Massed: [phone]  - Dra. Moye: [phone]  - Dra. Nicole Kindred: [phone]  En caso de inclemencias del [place], por favor llame a Johnsie Kindred principal al [phone] para una actualizacin sobre el [place] de cualquier retraso o cierre.  Consejos para la medicacin en dermatologa: Por favor, guarde las cajas en las que vienen los medicamentos de uso tpico para ayudarle a seguir las instrucciones sobre dnde y cmo usarlos. Las farmacias generalmente imprimen las instrucciones del medicamento slo en las cajas y no directamente en los tubos del [place].   Si su medicamento es muy caro, por favor, pngase en contacto con Zigmund Daniel llamando al [phone] y presione la opcin 4 o envenos un mensaje a travs de Pharmacist, community.   No podemos decirle cul ser su copago por los medicamentos por adelantado ya que esto es diferente dependiendo  de la cobertura de su seguro. Sin embargo, es posible que podamos encontrar un medicamento sustituto a Electrical engineer un formulario para que el seguro cubra el medicamento que se considera necesario.   Si se requiere una autorizacin previa para que su compaa de seguros [place] su medicamento, por favor permtanos de 1 a 2 das hbiles para completar este proceso.  Los precios de los medicamentos varan con frecuencia dependiendo del Environmental consultant de dnde se surte la receta y alguna farmacias pueden ofrecer precios ms baratos.  El sitio web www.goodrx.com tiene cupones para medicamentos de Airline pilot. Los precios aqu no tienen en cuenta lo que podra costar con la ayuda del seguro (puede ser ms barato con su seguro), pero el sitio web puede darle el precio si no utiliz Research scientist (physical sciences).  - Puede imprimir el cupn correspondiente y llevarlo con su receta a la farmacia.  - Tambin puede pasar por nuestra oficina durante el horario de atencin regular y Charity fundraiser una tarjeta de cupones de GoodRx.  - Si necesita que su receta se enve electrnicamente a una farmacia diferente, informe a nuestra oficina a travs de MyChart de [place] o por telfono llamando al [phone] y presione la opcin 4.

## 2022-02-12 NOTE — Progress Notes (Signed)
Follow-Up Visit   Subjective  Leah Santos is a 81 y.o. female who presents for the following: Annual Exam (1 year tbse . Rash under breast and lower abdomen ).   The patient presents for Total-Body Skin Exam (TBSE) for skin cancer screening and mole check.  The patient has spots, moles and lesions to be evaluated, some may be new or changing and the patient has concerns that these could be cancer.   The following portions of the chart were reviewed this encounter and updated as appropriate:  Tobacco  Allergies  Meds  Problems  Med Hx  Surg Hx  Fam Hx      Review of Systems: No other skin or systemic complaints except as noted in HPI or Assessment and Plan.   Objective  Well appearing patient in no apparent distress; mood and affect are within normal limits.  A full examination was performed including scalp, head, eyes, ears, nose, lips, neck, chest, axillae, abdomen, back, buttocks, bilateral upper extremities, bilateral lower extremities, hands, feet, fingers, toes, fingernails, and toenails. All findings within normal limits unless otherwise noted below.  inframammary folds and lower abdomen Erythematous macerated patches   forehead x 2, left superior shoulder x 1, right superior shoulder x 1 (4) Erythematous thin papules/macules with gritty scale.   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 Condition is symptomatic/ bothersome to patient. Not currently at goal.  Intertrigo is a chronic recurrent rash that occurs in skin fold areas that may be associated with friction; heat; moisture; yeast; fungus; and bacteria.  It is exacerbated by increased movement / activity;  sweating; and higher atmospheric temperature.   Continue using ketoconazole cream increasing to BID.   Recommend drying with hair dryer and applying zeasorb AF to help keep area dry after shower to prevent recurrence   Patient instructed to call if not clear in week or more. Will refill hydrocortisone 2.5 % cream to use twice a day as needed up to 1 week if needed.    Related Medications hydrocortisone 2.5 % cream Apply twice daily as needed to rash at groin, below breasts and at abdomen up to 1 week. Always use together with antifungal cream.    Related Medications hydrocortisone 2.5 % cream Apply twice daily as needed to rash at groin, below breasts and at abdomen up to 1 week. Always use together with antifungal cream.  Actinic keratosis (4) forehead x 2, left superior shoulder x 1, right superior shoulder x 1  Hypertrophic ak at left superior shoulder   Actinic keratoses are precancerous spots that appear secondary to cumulative UV radiation exposure/sun exposure over time. They are chronic with expected duration over 1 year. A portion of actinic keratoses will progress to squamous cell carcinoma of the skin. It is not possible to reliably predict which spots will progress to skin cancer and so treatment is recommended to prevent development of skin cancer.  Recommend daily broad spectrum sunscreen SPF 30+ to sun-exposed areas, reapply every 2 hours as needed.  Recommend staying in the shade or wearing long sleeves, sun glasses (UVA+UVB protection) and wide brim hats (4-inch brim around the entire circumference of the  hat). Call for new or changing lesions.  Destruction of lesion - forehead x 2, left superior shoulder x 1, right superior shoulder x 1  Destruction method: cryotherapy   Informed consent: discussed and consent obtained   Lesion destroyed using liquid nitrogen: Yes   Cryotherapy cycles:  2 Outcome: patient tolerated procedure well with no complications    Post-procedure details: wound care instructions given   Additional details:  Prior to procedure, discussed risks of blister formation, small wound, skin dyspigmentation, or rare scar following cryotherapy. Recommend Vaseline ointment to treated areas while healing.   Neoplasm of uncertain behavior (2) Left Medial Thigh  Epidermal / dermal shaving  Lesion diameter (cm):  0.3 Informed consent: discussed and consent obtained   Timeout: patient name, date of birth, surgical site, and procedure verified   Patient was prepped and draped in usual sterile fashion: area prepped with isopropyl alcohol. Anesthesia: the lesion was anesthetized in a standard fashion   Anesthetic:  1% lidocaine w/ epinephrine 1-100,000 buffered w/ 8.4% NaHCO3 Instrument used: flexible razor blade   Hemostasis achieved with: aluminum chloride   Outcome: patient tolerated procedure well   Post-procedure details: wound care instructions given   Additional details:  Mupirocin and a bandage applied  Specimen 1 - Surgical pathology Differential Diagnosis: R/o atypia   Check Margins: No  left pretibial  Skin / nail biopsy Type of biopsy: tangential   Informed consent: discussed and consent obtained   Timeout: patient name, date of birth, surgical site, and procedure verified   Patient was prepped and draped in usual sterile fashion: Area prepped with isopropyl alcohol. Anesthesia: the lesion was anesthetized in a standard fashion   Anesthetic:  1% lidocaine w/ epinephrine 1-100,000 buffered w/ 8.4% NaHCO3 Instrument used: flexible razor blade   Hemostasis achieved with: aluminum chloride   Outcome: patient tolerated procedure well   Post-procedure details: wound care instructions given   Additional details:  Mupirocin and a bandage applied  Specimen 2 - Surgical pathology Differential Diagnosis: r/o bcc   Check Margins: No  R/o atypia at left medial thigh  R/o BCC at left pretibia   Inflamed seborrheic  keratosis Head - Anterior (Face)  Symptomatic per patient. Inflamed on exam.  Prior to procedure, discussed risks of blister formation, small wound, skin dyspigmentation, or rare scar following cryotherapy. Recommend Vaseline ointment to treated areas while healing.  Benign-appearing.  Observation.  Call clinic for new or changing lesions.     Lentigines - Scattered tan macules - Due to sun exposure - Benign-appearing, observe - Recommend daily broad spectrum sunscreen SPF 30+ to sun-exposed areas, reapply every 2 hours as needed. - Call for any changes  Seborrheic Keratoses - Stuck-on, waxy, tan-brown papules and/or plaques  - Benign-appearing - Discussed benign etiology and prognosis. - Observe - Call for any changes  Melanocytic Nevi - Tan-brown and/or pink-flesh-colored symmetric macules and papules - Benign appearing on exam today - Observation - Call clinic for new or changing moles - Recommend daily use of broad spectrum spf 30+ sunscreen to sun-exposed areas.   Hemangiomas - Red papules - Discussed benign nature - Observe - Call for any changes  Actinic Damage - Chronic condition, secondary to cumulative UV/sun exposure - diffuse scaly erythematous macules with underlying dyspigmentation - Recommend daily broad spectrum sunscreen SPF 30+ to sun-exposed areas, reapply every 2 hours as needed.  - Staying in the shade or wearing long sleeves, sun glasses (UVA+UVB protection) and wide brim hats (4-inch brim around the entire  circumference of the hat) are also recommended for sun protection.  - Call for new or changing lesions.  Skin cancer screening performed today. Return in about 1 year (around 02/13/2023) for TBSE. I, Ruthell Rummage, CMA, am acting as scribe for Forest Gleason, MD.  Documentation: I have reviewed the above documentation for accuracy and completeness, and I agree with the above.  Forest Gleason, MD

## 2022-02-17 ENCOUNTER — Encounter: Payer: Self-pay | Admitting: Dermatology

## 2022-02-17 MED ORDER — KETOCONAZOLE 2 % EX CREA: 1.0000 | TOPICAL_CREAM | Freq: Two times a day (BID) | CUTANEOUS | 11 refills | Status: AC

## 2022-02-17 MED ORDER — HYDROCORTISONE 2.5 % EX CREA
TOPICAL_CREAM | CUTANEOUS | 2 refills | Status: AC
Start: 2022-02-17 — End: ?

## 2022-02-17 NOTE — Addendum Note (Signed)
Addended by: Ruthell Rummage A on: 02/17/2022 03:21 PM   Modules accepted: Orders

## 2022-02-17 NOTE — Addendum Note (Signed)
Addended by: Ruthell Rummage A on: 02/17/2022 06:05 PM   Modules accepted: Orders

## 2022-02-18 ENCOUNTER — Telehealth: Payer: Self-pay

## 2022-02-18 NOTE — Telephone Encounter (Addendum)
Called patient regarding results. She verbalized understanding and denied further questions at this time.   Patient scheduled for [facility] 03/25/22 at 12:10 pm.       ----- Message from Alfonso Patten, MD sent at 02/17/2022 11:10 PM EDT ----- 1. 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 On the spectrum from normal mole to melanoma skin cancer, this is in between but it is much closer to a normal mole.  - These typically do not progress to melanoma or cause any trouble.  - People who have a history of atypical moles do have a slightly increased risk of developing melanoma somewhere on the body, so a yearly full body skin exam by a dermatologist is recommended.  - Monthly self skin checks and daily sun protection are also recommended.  - Please call if you notice a dark spot coming back where this biopsy was taken.  - Please also call if you notice any new or changing spots anywhere else on the body before your follow-up visit.  - Please call our office or send [place] a message if you have any questions or concerns about this biopsy result.    2. Skin , left pretibial BASAL CELL CARCINOMA, SUPERFICIAL AND NODULAR PATTERNS --> ED&C recommended  MAs please call with results and schedule. Thank you!

## 2022-03-25 ENCOUNTER — Encounter: Payer: Self-pay | Admitting: Dermatology

## 2022-03-25 ENCOUNTER — Ambulatory Visit (INDEPENDENT_AMBULATORY_CARE_PROVIDER_SITE_OTHER): Payer: Medicare Other | Admitting: Dermatology

## 2022-03-25 DIAGNOSIS — C4491 Basal cell carcinoma of skin, unspecified: Secondary | ICD-10-CM

## 2022-03-25 DIAGNOSIS — L570 Actinic keratosis: Secondary | ICD-10-CM | POA: Diagnosis not present

## 2022-03-25 DIAGNOSIS — L304 Erythema intertrigo: Secondary | ICD-10-CM | POA: Diagnosis not present

## 2022-03-25 DIAGNOSIS — C44719 Basal cell carcinoma of skin of left lower limb, including hip: Secondary | ICD-10-CM | POA: Diagnosis not present

## 2022-03-25 NOTE — Patient Instructions (Addendum)
Cryotherapy Aftercare  Wash gently with soap and water everyday.   Apply Vaseline and Band-Aid daily until healed.  Wound Care Instructions  Cleanse wound gently with soap and water once a day then pat dry with clean gauze. Apply a thin coat of Petrolatum (petroleum jelly, "Vaseline") over the wound (unless you have an allergy to this). We recommend that you use a new, sterile tube of Vaseline. Do not pick or remove scabs. Do not remove the yellow or white "healing tissue" from the base of the wound.  Cover the wound with fresh, clean, nonstick gauze and secure with paper tape. You may use Band-Aids in place of gauze and tape if the wound is small enough, but would recommend trimming much of the tape off as there is often too much. Sometimes Band-Aids can irritate the skin.  You should call the office for your biopsy report after 1 week if you have not already been contacted.  If you experience any problems, such as abnormal amounts of bleeding, swelling, significant bruising, significant pain, or evidence of infection, please call the office immediately.  FOR ADULT SURGERY PATIENTS: If you need something for pain relief you may take 1 extra strength Tylenol (acetaminophen) AND 2 Ibuprofen (200mg each) together every 4 hours as needed for pain. (do not take these if you are allergic to them or if you have a reason you should not take them.) Typically, you may only need pain medication for 1 to 3 days.      Due to recent changes in healthcare laws, you may see results of your pathology and/or laboratory studies on MyChart before the doctors have had a chance to review them. We understand that in some cases there may be results that are confusing or concerning to you. Please understand that not all results are received at the same time and often the doctors may need to interpret multiple results in order to provide you with the best plan of care or course of treatment. Therefore, we ask that you  please give us 2 business days to thoroughly review all your results before contacting the office for clarification. Should we see a critical lab result, you will be contacted sooner.   If You Need Anything After Your Visit  If you have any questions or concerns for your doctor, please call our main line at [phone] and press option 4 to reach your doctor's medical assistant. If no one answers, please leave a voicemail as directed and we will return your call as soon as possible. Messages left after 4 pm will be answered the following business day.   You may also send us a message via MyChart. We typically respond to MyChart messages within 1-2 business days.  For prescription refills, please ask your pharmacy to contact our office. Our fax number is [phone].  If you have an urgent issue when the clinic is closed that cannot wait until the next business day, you can page your doctor at the number below.    Please note that while we do our best to be available for urgent issues outside of office hours, we are not available 24/7.   If you have an urgent issue and are unable to reach us, you may choose to seek medical care at your doctor's office, retail clinic, urgent care center, or emergency room.  If you have a medical emergency, please immediately call 911 or go to the emergency department.  Pager Numbers  - Dr. Kowalski: [phone]  -   Dr. Moye: [phone]  - Dr. Stewart: [phone]  In the event of inclement weather, please call our main line at [phone] for an update on the status of any delays or closures.  Dermatology Medication Tips: Please keep the boxes that topical medications come in in order to help keep track of the instructions about where and how to use these. Pharmacies typically print the medication instructions only on the boxes and not directly on the medication tubes.   If your medication is too expensive, please contact our office at  [phone] option 4 or send us a message through MyChart.   We are unable to tell what your co-pay for medications will be in advance as this is different depending on your insurance coverage. However, we may be able to find a substitute medication at lower cost or fill out paperwork to get insurance to cover a needed medication.   If a prior authorization is required to get your medication covered by your insurance company, please allow us 1-2 business days to complete this process.  Drug prices often vary depending on where the prescription is filled and some pharmacies may offer cheaper prices.  The website www.goodrx.com contains coupons for medications through different pharmacies. The prices here do not account for what the cost may be with help from insurance (it may be cheaper with your insurance), but the website can give you the price if you did not use any insurance.  - You can print the associated coupon and take it with your prescription to the pharmacy.  - You may also stop by our office during regular business hours and pick up a GoodRx coupon card.  - If you need your prescription sent electronically to a different pharmacy, notify our office through [place] MyChart or by phone at [phone] option 4.     Si Usted Necesita Algo Despus de Su Visita  Tambin puede enviarnos un mensaje a travs de MyChart. Por lo general respondemos a los mensajes de MyChart en el transcurso de 1 a 2 das hbiles.  Para renovar recetas, por favor pida a su farmacia que se ponga en contacto con nuestra oficina. Nuestro nmero de fax es el [phone].  Si tiene un asunto urgente cuando la clnica est cerrada y que no puede esperar hasta el siguiente da hbil, puede llamar/localizar a su doctor(a) al nmero que aparece a continuacin.   Por favor, tenga en cuenta que aunque hacemos todo lo posible para estar disponibles para asuntos urgentes fuera del horario de oficina, no estamos  disponibles las 24 horas del da, los 7 das de la semana.   Si tiene un problema urgente y no puede comunicarse con nosotros, puede optar por buscar atencin mdica  en el consultorio de su doctor(a), en una clnica privada, en un centro de atencin urgente o en una sala de emergencias.  Si tiene una emergencia mdica, por favor llame inmediatamente al 911 o vaya a la sala de emergencias.  Nmeros de bper  - Dr. Kowalski: [phone]  - Dra. Moye: [phone]  - Dra. Stewart: [phone]  En caso de inclemencias del tiempo, por favor llame a nuestra lnea principal al [phone] para una actualizacin sobre el estado de cualquier retraso o cierre.  Consejos para la medicacin en dermatologa: Por favor, guarde las cajas en las que vienen los medicamentos de uso tpico para ayudarle a seguir las instrucciones sobre dnde y cmo usarlos. Las farmacias generalmente imprimen las instrucciones del medicamento slo en las cajas y   no directamente en los tubos del medicamento.   Si su medicamento es muy caro, por favor, pngase en contacto con nuestra oficina llamando al [phone] y presione la opcin 4 o envenos un mensaje a travs de MyChart.   No podemos decirle cul ser su copago por los medicamentos por adelantado ya que esto es diferente dependiendo de la cobertura de su seguro. Sin embargo, es posible que podamos encontrar un medicamento sustituto a menor costo o llenar un formulario para que el seguro cubra el medicamento que se considera necesario.   Si se requiere una autorizacin previa para que su compaa de seguros cubra su medicamento, por favor permtanos de 1 a 2 das hbiles para completar este proceso.  Los precios de los medicamentos varan con frecuencia dependiendo del lugar de dnde se surte la receta y alguna farmacias pueden ofrecer precios ms baratos.  El sitio web www.goodrx.com tiene cupones para medicamentos de diferentes farmacias. Los precios aqu no  tienen en cuenta lo que podra costar con la ayuda del seguro (puede ser ms barato con su seguro), pero el sitio web puede darle el precio si no utiliz ningn seguro.  - Puede imprimir el cupn correspondiente y llevarlo con su receta a la farmacia.  - Tambin puede pasar por nuestra oficina durante el horario de atencin regular y recoger una tarjeta de cupones de GoodRx.  - Si necesita que su receta se enve electrnicamente a una farmacia diferente, informe a nuestra oficina a travs de MyChart de [place] o por telfono llamando al [phone] y presione la opcin 4.  

## 2022-03-25 NOTE — Progress Notes (Unsigned)
Follow-Up Visit   Subjective  Leah Santos is a 81 y.o. female who presents for the following: Follow-up (Patient here for treatment of a biopsy proven superficial BCC on the left pretibial ).  She asks about which medicines to use for rash at her abdomen.  The following portions of the chart were reviewed this encounter and updated as appropriate:   Tobacco  Allergies  Meds  Problems  Med Hx  Surg Hx  Fam Hx      Review of Systems:  No other skin or systemic complaints except as noted in HPI or Assessment and Plan.  Objective  Well appearing patient in no apparent distress; mood and affect are within normal limits.  A focused examination was performed including left pretibial. Relevant physical exam findings are noted in the Assessment and Plan.  left pretibial Pink papule  left forehead x 2 (2) Erythematous thin papules/macules with gritty scale.     Assessment & Plan  Superficial basal cell carcinoma left pretibial  Destruction of lesion  Destruction method: electrodesiccation and curettage   Informed consent: discussed and consent obtained   Timeout:  patient name, date of birth, surgical site, and procedure verified Anesthesia: the lesion was anesthetized in a standard fashion   Anesthetic:  1% lidocaine w/ epinephrine 1-100,000 buffered w/ 8.4% NaHCO3 Curettage performed in three different directions: Yes   Electrodesiccation performed over the curetted area: Yes   Curettage cycles:  3 Final wound size (cm):  0.9 Hemostasis achieved with:  electrodesiccation Outcome: patient tolerated procedure well with no complications   Post-procedure details: sterile dressing applied and wound care instructions given   Dressing type: petrolatum    AK (actinic keratosis) (2) left forehead x 2  Actinic keratoses are precancerous spots that appear secondary to cumulative UV radiation exposure/sun exposure over time. They are chronic with expected duration over  1 year. A portion of actinic keratoses will progress to squamous cell carcinoma of the skin. It is not possible to reliably predict which spots will progress to skin cancer and so treatment is recommended to prevent development of skin cancer.  Recommend daily broad spectrum sunscreen SPF 30+ to sun-exposed areas, reapply every 2 hours as needed.  Recommend staying in the shade or wearing long sleeves, sun glasses (UVA+UVB protection) and wide brim hats (4-inch brim around the entire circumference of the hat). Call for new or changing lesions.   Destruction of lesion - left forehead x 2 Complexity: simple   Destruction method: cryotherapy   Informed consent: discussed and consent obtained   Timeout:  patient name, date of birth, surgical site, and procedure verified Lesion destroyed using liquid nitrogen: Yes   Region frozen until ice ball extended beyond lesion: Yes   Outcome: patient tolerated procedure well with no complications   Post-procedure details: wound care instructions given   Additional details:  Prior to procedure, discussed risks of blister formation, small wound, skin dyspigmentation, or rare scar following cryotherapy. Recommend Vaseline ointment to treated areas while healing.   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 Currently well-controlled.   Intertrigo is a chronic recurrent rash that occurs in skin fold areas that may be associated with friction; heat; moisture; yeast; fungus; and bacteria.  It is exacerbated by increased movement / activity; sweating; and higher atmospheric temperature.   Continue using ketoconazole cream alternating with Hydrocortisone 2.5% cream    Recommend drying with hair dryer and applying  zeasorb AF to help keep area dry after shower to prevent recurrence  Related Medications hydrocortisone 2.5 % cream Apply twice daily as needed to rash at groin, below  breasts and at abdomen up to 1 week. Always use together with antifungal cream.  ketoconazole (NIZORAL) 2 % cream Apply 1 Application topically 2 (two) times daily. Apply to affected areas at lower abdomen and under breasts bilateral   Return in about 3 months (around 06/24/2022) for Aks, BCC and 6-12 months TBSE .  I, Marye Round, CMA, am acting as scribe for Forest Gleason, MD .   Documentation: I have reviewed the above documentation for accuracy and completeness, and I agree with the above.  Forest Gleason, MD

## 2022-03-26 ENCOUNTER — Encounter: Payer: Self-pay | Admitting: Dermatology

## 2022-06-25 ENCOUNTER — Encounter: Payer: Self-pay | Admitting: Dermatology

## 2022-06-25 ENCOUNTER — Ambulatory Visit (INDEPENDENT_AMBULATORY_CARE_PROVIDER_SITE_OTHER): Payer: Medicare Other | Admitting: Dermatology

## 2022-06-25 VITALS — BP 141/74 | HR 84

## 2022-06-25 DIAGNOSIS — L570 Actinic keratosis: Secondary | ICD-10-CM | POA: Diagnosis not present

## 2022-06-25 DIAGNOSIS — L304 Erythema intertrigo: Secondary | ICD-10-CM

## 2022-06-25 DIAGNOSIS — L219 Seborrheic dermatitis, unspecified: Secondary | ICD-10-CM | POA: Diagnosis not present

## 2022-06-25 DIAGNOSIS — L578 Other skin changes due to chronic exposure to nonionizing radiation: Secondary | ICD-10-CM

## 2022-06-25 DIAGNOSIS — Z85828 Personal history of other malignant neoplasm of skin: Secondary | ICD-10-CM

## 2022-06-25 DIAGNOSIS — L230 Allergic contact dermatitis due to metals: Secondary | ICD-10-CM

## 2022-06-25 NOTE — Patient Instructions (Signed)
Due to recent changes in healthcare laws, you may see results of your pathology and/or laboratory studies on MyChart before the doctors have had a chance to review them. We understand that in some cases there may be results that are confusing or concerning to you. Please understand that not all results are received at the same time and often the doctors may need to interpret multiple results in order to provide you with the best plan of care or course of treatment. Therefore, we ask that you please give us 2 business days to thoroughly review all your results before contacting the office for clarification. Should we see a critical lab result, you will be contacted sooner.   If You Need Anything After Your Visit  If you have any questions or concerns for your doctor, please call our main line at [phone] and press option 4 to reach your doctor's medical assistant. If no one answers, please leave a voicemail as directed and we will return your call as soon as possible. Messages left after 4 pm will be answered the following business day.   You may also send us a message via MyChart. We typically respond to MyChart messages within 1-2 business days.  For prescription refills, please ask your pharmacy to contact our office. Our fax number is [phone].  If you have an urgent issue when the clinic is closed that cannot wait until the next business day, you can page your doctor at the number below.    Please note that while we do our best to be available for urgent issues outside of office hours, we are not available 24/7.   If you have an urgent issue and are unable to reach us, you may choose to seek medical care at your doctor's office, retail clinic, urgent care center, or emergency room.  If you have a medical emergency, please immediately call 911 or go to the emergency department.  Pager Numbers  - Dr. Kowalski: [phone]  - Dr. Moye: [phone]  - Dr. Stewart:  [phone]  In the event of inclement weather, please call our main line at [phone] for an update on the status of any delays or closures.  Dermatology Medication Tips: Please keep the boxes that topical medications come in in order to help keep track of the instructions about where and how to use these. Pharmacies typically print the medication instructions only on the boxes and not directly on the medication tubes.   If your medication is too expensive, please contact our office at [phone] option 4 or send us a message through MyChart.   We are unable to tell what your co-pay for medications will be in advance as this is different depending on your insurance coverage. However, we may be able to find a substitute medication at lower cost or fill out paperwork to get insurance to cover a needed medication.   If a prior authorization is required to get your medication covered by your insurance company, please allow us 1-2 business days to complete this process.  Drug prices often vary depending on where the prescription is filled and some pharmacies may offer cheaper prices.  The website www.goodrx.com contains coupons for medications through different pharmacies. The prices here do not account for what the cost may be with help from insurance (it may be cheaper with your insurance), but the website can give you the price if you did not use any insurance.  - You can print the associated coupon and take it with   your prescription to the pharmacy.  - You may also stop by our office during regular business hours and pick up a GoodRx coupon card.  - If you need your prescription sent electronically to a different pharmacy, notify our office through [facility] MyChart or by phone at [phone] option 4.     Si Usted Necesita Algo Despus de Su Visita  Tambin puede enviarnos un mensaje a travs de MyChart. Por lo general respondemos a los mensajes de MyChart en el transcurso de 1 a 2  das hbiles.  Para renovar recetas, por favor pida a su farmacia que se ponga en contacto con nuestra oficina. Nuestro nmero de fax es el [phone].  Si tiene un asunto urgente cuando la clnica est cerrada y que no puede esperar hasta el siguiente da hbil, puede llamar/localizar a su doctor(a) al nmero que aparece a continuacin.   Por favor, tenga en cuenta que aunque hacemos todo lo posible para estar disponibles para asuntos urgentes fuera del horario de oficina, no estamos disponibles las 24 horas del da, los 7 das de la semana.   Si tiene un problema urgente y no puede comunicarse con nosotros, puede optar por buscar atencin mdica  en el consultorio de su doctor(a), en una clnica privada, en un centro de atencin urgente o en una sala de emergencias.  Si tiene una emergencia mdica, por favor llame inmediatamente al 911 o vaya a la sala de emergencias.  Nmeros de bper  - Dr. Kowalski: [phone]  - Dra. Moye: [phone]  - Dra. Stewart: [phone]  En caso de inclemencias del tiempo, por favor llame a nuestra lnea principal al [phone] para una actualizacin sobre el estado de cualquier retraso o cierre.  Consejos para la medicacin en dermatologa: Por favor, guarde las cajas en las que vienen los medicamentos de uso tpico para ayudarle a seguir las instrucciones sobre dnde y cmo usarlos. Las farmacias generalmente imprimen las instrucciones del medicamento slo en las cajas y no directamente en los tubos del medicamento.   Si su medicamento es muy caro, por favor, pngase en contacto con nuestra oficina llamando al [phone] y presione la opcin 4 o envenos un mensaje a travs de MyChart.   No podemos decirle cul ser su copago por los medicamentos por adelantado ya que esto es diferente dependiendo de la cobertura de su seguro. Sin embargo, es posible que podamos encontrar un medicamento sustituto a menor costo o llenar un formulario para que el  seguro cubra el medicamento que se considera necesario.   Si se requiere una autorizacin previa para que su compaa de seguros cubra su medicamento, por favor permtanos de 1 a 2 das hbiles para completar este proceso.  Los precios de los medicamentos varan con frecuencia dependiendo del lugar de dnde se surte la receta y alguna farmacias pueden ofrecer precios ms baratos.  El sitio web www.goodrx.com tiene cupones para medicamentos de diferentes farmacias. Los precios aqu no tienen en cuenta lo que podra costar con la ayuda del seguro (puede ser ms barato con su seguro), pero el sitio web puede darle el precio si no utiliz ningn seguro.  - Puede imprimir el cupn correspondiente y llevarlo con su receta a la farmacia.  - Tambin puede pasar por nuestra oficina durante el horario de atencin regular y recoger una tarjeta de cupones de GoodRx.  - Si necesita que su receta se enve electrnicamente a una farmacia diferente, informe a nuestra oficina a travs de MyChart de [place]   o por telfono llamando al [phone] y presione la opcin 4.  

## 2022-06-25 NOTE — Progress Notes (Signed)
Follow-Up Visit   Subjective  Leah Santos is a 82 y.o. female who presents for the following: Actinic Keratosis (Recheck sun exposed areas, previously treated L forehead x 2 with LN2) and Superficial BCC (L pretibial, previously treated with ED&C, recheck today ).   Patient has noticed that when she wears earrings she does get irritation around sites, she also had a knot that drained.    The following portions of the chart were reviewed this encounter and updated as appropriate:   Tobacco  Allergies  Meds  Problems  Med Hx  Surg Hx  Fam Hx      Review of Systems:  No other skin or systemic complaints except as noted in HPI or Assessment and Plan.  Objective  Well appearing patient in no apparent distress; mood and affect are within normal limits.  A focused examination was performed including the face and legs. Relevant physical exam findings are noted in the Assessment and Plan.  L forehead x 2 (2) Erythematous thin papules/macules with gritty scale.   L ear Scaly erythematous patch  Post auricular areas Scale behind the ears.    Assessment & Plan  AK (actinic keratosis) (2) L forehead x 2  Actinic keratoses are precancerous spots that appear secondary to cumulative UV radiation exposure/sun exposure over time. They are chronic with expected duration over 1 year. A portion of actinic keratoses will progress to squamous cell carcinoma of the skin. It is not possible to reliably predict which spots will progress to skin cancer and so treatment is recommended to prevent development of skin cancer.  Recommend daily broad spectrum sunscreen SPF 30+ to sun-exposed areas, reapply every 2 hours as needed.  Recommend staying in the shade or wearing long sleeves, sun glasses (UVA+UVB protection) and wide brim hats (4-inch brim around the entire circumference of the hat). Call for new or changing lesions.  Prior to procedure, discussed risks of blister formation,  small wound, skin dyspigmentation, or rare scar following cryotherapy. Recommend Vaseline ointment to treated areas while healing.   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 Recommend gold earrings only as patient has tolerated them in the past. May use HC 2.5% cream to aa's QD-BID up to two weeks.  Could consider patch testing in future if needed.  Seborrheic dermatitis Post auricular areas  Chronic and persistent condition with duration or expected duration over one year. Condition is symptomatic/ bothersome to patient. Not currently at goal.  Seborrheic Dermatitis  -  is a chronic persistent rash characterized by pinkness and scaling most commonly of the mid face but also can occur on the scalp (dandruff), ears; mid chest, mid back and groin.  It tends to be exacerbated by stress and cooler weather.  People who have neurologic disease may experience new onset or exacerbation of existing seborrheic dermatitis.  The condition is not curable but treatable and can be controlled.  May use Ketoconazole 2% cream twice a day PRN and HC 2.5% cream QD-BID up to two weeks. If not improving  with HC cream, patient can contact the office and we can send in [place] 0.1%.   Erythema intertrigo Inframmary folds and lower abodmen  Not addressed today. Continue ketoconazole cream and HC cream as directed.   Related Medications hydrocortisone 2.5 % cream Apply twice daily as needed to rash at groin, below breasts and at abdomen up  to 1 week. Always use together with antifungal cream.   Actinic Damage - chronic, secondary to cumulative UV radiation exposure/sun exposure over time - diffuse scaly erythematous macules with underlying dyspigmentation - Recommend daily broad spectrum sunscreen SPF 30+ to sun-exposed areas, reapply every 2 hours as needed.  - Recommend staying in the shade or wearing long sleeves, sun glasses (UVA+UVB protection) and wide brim hats (4-inch brim around the entire circumference of the hat). - Call for new or changing lesions.  History of Basal Cell Carcinoma of the Skin - No evidence of recurrence today - Recommend regular full body skin exams - Recommend daily broad spectrum sunscreen SPF 30+ to sun-exposed areas, reapply every 2 hours as needed.  - Call if any new or changing lesions are noted between office visits  Return if symptoms worsen or fail to improve, for follow up patient moving back to [place] to be closer to her children.  Luther Redo, CMA, am acting as scribe for Forest Gleason, MD .  Documentation: I have reviewed the above documentation for accuracy and completeness, and I agree with the above.  Forest Gleason, MD

## 2022-09-24 ENCOUNTER — Encounter: Payer: Medicare Other | Admitting: Dermatology
